# Patient Record
Sex: Female | Born: 1995 | Race: Black or African American | Hispanic: No | Marital: Single | Smoking: Never smoker
Health system: Southern US, Community
[De-identification: ages and names within clinical notes are randomized; demographics above are authoritative.]

## PROBLEM LIST (undated history)

## (undated) DIAGNOSIS — A549 Gonococcal infection, unspecified: Secondary | ICD-10-CM

## (undated) DIAGNOSIS — O24419 Gestational diabetes mellitus in pregnancy, unspecified control: Secondary | ICD-10-CM

## (undated) DIAGNOSIS — Z789 Other specified health status: Secondary | ICD-10-CM

## (undated) DIAGNOSIS — D649 Anemia, unspecified: Secondary | ICD-10-CM

## (undated) DIAGNOSIS — A749 Chlamydial infection, unspecified: Secondary | ICD-10-CM

## (undated) DIAGNOSIS — B999 Unspecified infectious disease: Secondary | ICD-10-CM

## (undated) HISTORY — PX: NO PAST SURGERIES: SHX2092

---

## 2016-02-27 ENCOUNTER — Inpatient Hospital Stay (HOSPITAL_COMMUNITY)
Admission: AD | Admit: 2016-02-27 | Discharge: 2016-02-28 | Disposition: A | Discharge status: Home / Self Care | Payer: Medicaid Other | Source: Ambulatory Visit | Attending: Family Medicine | Admitting: Family Medicine

## 2016-02-27 DIAGNOSIS — Z7251 High risk heterosexual behavior: Secondary | ICD-10-CM

## 2016-02-27 DIAGNOSIS — Z202 Contact with and (suspected) exposure to infections with a predominantly sexual mode of transmission: Secondary | ICD-10-CM | POA: Insufficient documentation

## 2016-02-27 DIAGNOSIS — A549 Gonococcal infection, unspecified: Secondary | ICD-10-CM | POA: Insufficient documentation

## 2016-02-27 HISTORY — DX: Other specified health status: Z78.9

## 2016-02-27 NOTE — MAU Note (Signed)
Boyfriend told me today he has gonorrhea. Having some white d/c. No pain

## 2016-02-28 ENCOUNTER — Encounter (HOSPITAL_COMMUNITY): Payer: Self-pay | Admitting: *Deleted

## 2016-02-28 DIAGNOSIS — A549 Gonococcal infection, unspecified: Secondary | ICD-10-CM | POA: Diagnosis not present

## 2016-02-28 DIAGNOSIS — Z202 Contact with and (suspected) exposure to infections with a predominantly sexual mode of transmission: Secondary | ICD-10-CM

## 2016-02-28 LAB — WET PREP, GENITAL
Sperm: NONE SEEN
Trich, Wet Prep: NONE SEEN
YEAST WET PREP: NONE SEEN

## 2016-02-28 LAB — POCT PREGNANCY, URINE: PREG TEST UR: NEGATIVE

## 2016-02-28 LAB — RPR: RPR: NONREACTIVE

## 2016-02-28 LAB — HIV ANTIBODY (ROUTINE TESTING W REFLEX): HIV Screen 4th Generation wRfx: NONREACTIVE

## 2016-02-28 MED ORDER — LEVONORGESTREL 0.75 MG PO TABS: 0.7500 mg | ORAL_TABLET | Freq: Two times a day (BID) | ORAL | 0 refills | Status: DC

## 2016-02-28 MED ORDER — AZITHROMYCIN 250 MG PO TABS
1000.0000 mg | ORAL_TABLET | Freq: Once | ORAL | Status: AC
  Administered 2016-02-28: 1000 mg via ORAL
  Filled 2016-02-28: qty 4

## 2016-02-28 MED ORDER — CEFTRIAXONE SODIUM 250 MG IJ SOLR
250.0000 mg | Freq: Once | INTRAMUSCULAR | Status: AC
  Administered 2016-02-28: 250 mg via INTRAMUSCULAR
  Filled 2016-02-28: qty 250

## 2016-02-28 NOTE — Discharge Instructions (Signed)
Gonorrhea Gonorrhea is an infection that can cause serious problems. If left untreated, the infection may:   Damage the female or female organs.   Cause women to be unable to have children (sterility).   Harm a fetus if the infected woman is pregnant.  It is important to get treatment for gonorrhea as soon as possible. It is also necessary that all your sexual partners be tested for the infection.  CAUSES  Gonorrhea is caused by bacteria called Neisseria gonorrhoeae. The infection is spread from person to person, usually by sexual contact (such as by anal, vaginal, or oral means). A newborn can contract the infection from his or her mother during birth.  RISK FACTORS  Being a woman younger than 20 years of age who is sexually active.  Being a woman 56 years of age or older who has:  A new sex partner.  More than one sex partner.  A sex partner who has a sexually transmitted disease (STD).  Using condoms inconsistently.  Currently having, or having previously had, an STD.  Exchanging sex or money or drugs. SYMPTOMS  Some people with gonorrhea do not have symptoms. Symptoms may be different in females and males.  Females The most common symptoms are:   Pain in the lower abdomen.   Fever with or without chills.  Other symptoms include:   Abnormal vaginal discharge.   Painful intercourse.   Burning or itching of the vagina or lips of the vagina.   Abnormal vaginal bleeding.   Pain when urinating.   Long-lasting (chronic) pain in the lower abdomen, especially during menstruation or intercourse.   Inability to become pregnant.   Going into premature labor.   Irritation, pain, bleeding, or discharge from the rectum. This may occur if the infection was spread by anal sex.   Sore throat or swollen lymph nodes in the neck. This may occur if the infection was spread by oral sex.  Males The most common symptoms are:   Discharge from the penis.   Pain  or burning during urination.   Pain or swelling in the testicles. Other symptoms may include:   Irritation, pain, bleeding, or discharge from the rectum. This may occur if the infection was spread by anal sex.   Sore throat, fever, or swollen lymph nodes in the neck. This may occur if the infection was spread by oral sex.  DIAGNOSIS  A diagnosis is made after a physical exam is done and a sample of discharge is examined under a microscope for the presence of the bacteria. The discharge may be taken from the urethra, cervix, throat, or rectum.  TREATMENT  Gonorrhea is treated with antibiotic medicines. It is important for treatment to begin as soon as possible. Early treatment may prevent some problems from developing. Do not have sex. Avoid all types of sexual activity for 7 days after treatment is complete and until any sex partners have been treated. HOME CARE INSTRUCTIONS   Take medicines only as directed by your health care provider.   Take your antibiotic medicine as directed by your health care provider. Finish the antibiotic even if you start to feel better. Incomplete treatment will put you at risk for continued infection.   Do not have sex until treatment is complete or as directed by your health care provider.   Keep all follow-up visits as directed by your health care provider.   Not all test results are available during your visit. If your test results are not back  during the visit, make an appointment with your health care provider to find out the results. Do not assume everything is normal if you have not heard from your health care provider or the medical facility. It is your responsibility to get your test results.  If you test positive for gonorrhea, inform your recent sexual partners. They need to be checked for gonorrhea even if they do not have symptoms. They may need treatment, even if they test negative for gonorrhea.  SEEK MEDICAL CARE IF:   You develop any  bad reaction to the medicine you were prescribed. This may include:   A rash.   Nausea.   Vomiting.   Diarrhea.   Your symptoms do not improve after a few days of taking antibiotics.   Your symptoms get worse.   You develop increased pain, such as in the testicles (for males) or in the abdomen (for females).  You have a fever. MAKE SURE YOU:   Understand these instructions.  Will watch your condition.  Will get help right away if you are not doing well or get worse. This information is not intended to replace advice given to you by your health care provider. Make sure you discuss any questions you have with your health care provider. Document Released: 12/26/1999 Document Revised: 01/18/2014 Document Reviewed: 07/05/2012 Elsevier Interactive Patient Education  2017 Elsevier Inc.  Emergency Contraception Emergency contraceptives prevent pregnancy after unprotected sexual intercourse. They can also be used:  When a condom breaks.  After a sexual assault.  If you forgot to take your birth control pills.  When inadequate protection occurs with sexual intercourse. Usually, emergency contraception is a pill or combination of pills taken right after sex or up to 5 days after unprotected sex. It is most effective the sooner you take the pills after having sexual intercourse. Most types of emergency contraceptive pills are available without a prescription. One type requires a prescription from your health care provider. Also, young women under age 44 need a prescription for most types of emergency contraception. Check with your pharmacist. Do not use emergency contraception as your only form of birth control. These pills do not protect against sexually transmitted infections (STIs). Emergency contraception will not work if you are already pregnant and will not harm the baby if you are pregnant. Emergency contraception does not cause an abortion. The pills work by preventing the  ovaries from releasing an egg (ovulation) or the fertilization of an egg. Taking St. Johns wort, certain antibiotic medicines, and certain anticonvulsant medicines may make these pills less effective. Discuss with your health care provider the possible side effects of emergency contraceptives. These may include:  Abdominal pain and cramps.  Breast tenderness.  Headache.  Dizziness.  Fatigue.  Irregular bleeding or spotting. Types of emergency contraceptives  Some types of emergency contraceptive pills contain estrogen and progesterone in higher doses.  Some types just contain progesterone. They are available as a single pill or two pills taken 12-24 hours apart.  One type of pill is not a hormone. It prevents the hormone progesterone from having its normal effect on ovulation and the lining of the uterus.  An intrauterine device (IUD) may be used.This T-shaped device is also used as a form of birth control. It is inserted into the uterus to prevent pregnancy. The copper IUD can also be used as emergency contraception if inserted within 5 days of having unprotected intercourse. Follow these instructions at home:  Eat something before taking the emergency contraceptive  pills.  Lie down for a couple of hours if you become tired or dizzy.  Continue using birth control until you start your menstrual period. Contact a health care provider if:  You throw up (vomit) within 2 hours after taking the pill. You will have to take another pill.  You need treatment for nausea, vomiting, headache, or abdominal cramps.  You have not had a menstrual period 21 days after taking the pill.  You are having irregular bleeding or spotting. Get help right away if:  You have chest pain.  You have leg pain.  You have numbness or weakness of your arms or legs.  You have slurred speech.  You have visual problems. This information is not intended to replace advice given to you by your health  care provider. Make sure you discuss any questions you have with your health care provider. Document Released: 03/08/2001 Document Revised: 06/05/2015 Document Reviewed: 06/11/2012 Elsevier Interactive Patient Education  2017 [employer]

## 2016-02-28 NOTE — MAU Provider Note (Signed)
Chief Complaint: Exposure to STD   SUBJECTIVE HPI: Christy [name] Currentlexis Christy [name] is a 21 y.o. G0P0000 at Unknown who presents to [facility] reporting exposure to gonorrhea.  Patient said her BF called her to inform her that he was positive for gonorrhea when he went to the health department today. She had unprotected sex with him two nights ago, after the condom broke. She is noticing some abnormal discharge, starting yesterday and today. Noticed spotting as well. She is not on birth control, does not want to be pregnant, desires plan B, and wants contraception. Never had STI before. Has had 6 sexual partners.    Past Medical History:  Diagnosis Date  . Medical history non-contributory    OB History  Gravida Para Term Preterm AB Living  0 0 0 0 0 0  SAB TAB Ectopic Multiple Live Births  0 0 0 0 0       Past Surgical History:  Procedure Laterality Date  . NO PAST SURGERIES     Social History   Social History  . Marital status: Single    Spouse name: N/A  . Number of children: N/A  . Years of education: N/A   Occupational History  . Not on file.   Social History Main Topics  . Smoking status: Never Smoker  . Smokeless tobacco: Never Used  . Alcohol use No  . Drug use: Yes    Types: Marijuana  . Sexual activity: Yes    Birth control/ protection: None   Other Topics Concern  . Not on file   Social History Narrative  . No narrative on file   No current facility-administered medications on file prior to encounter.    No current outpatient prescriptions on file prior to encounter.   Allergies not on file  I have reviewed the past Medical Hx, Surgical Hx, Social Hx, Allergies and Medications.   REVIEW OF SYSTEMS  A comprehensive ROS was negative except per HPI.    OBJECTIVE Patient Vitals for the past 24 hrs:  BP Temp Pulse Resp Height Weight  02/27/16 2348 118/74 98.3 F (36.8 C) 93 18 5\' 3"  (1.6 m) 147 lb 11.2 oz (67 kg)    PHYSICAL  EXAM Constitutional: Well-developed, well-nourished female in no acute distress.  Cardiovascular: normal rate, rhythm, no murmurs Respiratory: normal rate and effort. CTAB GI: Abd soft, non-tender, non-distended. Pos BS x 4 MS: Extremities nontender, no edema, normal ROM Neurologic: Alert and oriented x 4.  GU: Neg CVAT. SPECULUM EXAM: NEFG, frothy yellowish/white discharge, no blood noted, cervix clean but friable. BIMANUAL: cervix closed; uterus normal size, no adnexal tenderness or masses. No CMT.  LAB RESULTS Results for orders placed or performed during the hospital encounter of 02/27/16 (from the past 24 hour(s))  Pregnancy, urine POC     Status: None   Collection Time: 02/28/16 12:08 AM  Result Value Ref Range   Preg Test, Ur NEGATIVE NEGATIVE  Wet prep, genital     Status: Abnormal   Collection Time: 02/28/16  1:33 AM  Result Value Ref Range   Yeast Wet Prep HPF POC NONE SEEN NONE SEEN   Trich, Wet Prep NONE SEEN NONE SEEN   Clue Cells Wet Prep HPF POC PRESENT (A) NONE SEEN   WBC, Wet Prep HPF POC MANY (A) NONE SEEN   Sperm NONE SEEN     IMAGING No results found.  MAU COURSE STD panel Wet prep- +BV SSE Bimanual - no CMT Treatment given for GC/CT (Rocephin and  azithro)  MDM Plan of care reviewed with patient, including labs and tests ordered and medical treatment. Patient had exposure to gonorrhea, given treatment here for exposure. STD panel performed, as requested by patient. She also desires to not get pregnant and would like the plan B prescribed, educated patient must be taken before 5 days after unprotected intercourse (2 days ago), but best to be taken within 72 hours. She desires birth control, but due to recent unprotected intercourse, despite negative pregnancy test, informed patient to follow up with PCP or OB/GYN in 2 weeks for repeat pregnancy test and for birth control.    ASSESSMENT 1. Exposure to gonorrhea   2. Unprotected sex     PLAN Discharge  home in stable condition. S/P Azithro and Rocephin Plan B Rx given per patient request Follow up with PCP or OB/GYN for contraception  Follow-up Information    OB/GYN provider of choice Follow up in 2 week(s).   Why:  Follow up for contraception and STD recheck         Allergies as of 02/28/2016   Not on File     Medication List    TAKE these medications   levonorgestrel 0.75 MG tablet Commonly known as:  PLAN B Take 1 tablet (0.75 mg total) by mouth every 12 (twelve) hours.        Jen Mow, DO OB Fellow 02/28/2016 3:34 AM

## 2016-03-01 LAB — GC/CHLAMYDIA PROBE AMP (~~LOC~~) NOT AT ARMC
CHLAMYDIA, DNA PROBE: POSITIVE — AB
NEISSERIA GONORRHEA: POSITIVE — AB

## 2016-03-03 ENCOUNTER — Telehealth (HOSPITAL_COMMUNITY): Payer: Self-pay

## 2016-03-03 NOTE — Telephone Encounter (Signed)
2nd/Final attempt to contact patient regarding patient's STD lab results from her last visit. Patient's voice mail has not been set up. Communicable Disease Report faxed to the health clinic.

## 2016-03-04 NOTE — Progress Notes (Signed)
Received a call from Demi, NT from MAU, in regards to sending a certified letter for a pt that tested positive for STD.  Certified letter sent.  Demi, NT, charted that STD [facility] was [facility].

## 2017-09-12 ENCOUNTER — Other Ambulatory Visit: Payer: Self-pay

## 2017-09-12 ENCOUNTER — Encounter (HOSPITAL_COMMUNITY): Payer: Self-pay | Admitting: *Deleted

## 2017-09-12 ENCOUNTER — Inpatient Hospital Stay (HOSPITAL_COMMUNITY)
Admission: AD | Admit: 2017-09-12 | Discharge: 2017-09-12 | Disposition: A | Discharge status: Home / Self Care | Payer: Self-pay | Source: Ambulatory Visit | Attending: Obstetrics and Gynecology | Admitting: Obstetrics and Gynecology

## 2017-09-12 DIAGNOSIS — Z202 Contact with and (suspected) exposure to infections with a predominantly sexual mode of transmission: Secondary | ICD-10-CM

## 2017-09-12 DIAGNOSIS — K529 Noninfective gastroenteritis and colitis, unspecified: Secondary | ICD-10-CM | POA: Insufficient documentation

## 2017-09-12 LAB — WET PREP, GENITAL
Clue Cells Wet Prep HPF POC: NONE SEEN
SPERM: NONE SEEN
TRICH WET PREP: NONE SEEN
YEAST WET PREP: NONE SEEN

## 2017-09-12 LAB — POCT PREGNANCY, URINE: Preg Test, Ur: NEGATIVE

## 2017-09-12 NOTE — MAU Provider Note (Signed)
S: 22 y.o. G0 reports onset of abdominal cramping and loose stools 2 days ago. She reports her symptoms are improved today but she still had to miss work. She denies any fever/chills or n/v.  She has not tried any treatments. She drank alcohol on the night before symptoms started. She denies sick contacts.  She desires STD testing.   O: BP 110/72 (BP Location: Right Arm)   Pulse 72   Temp 98.7 F (37.1 C) (Oral)   Resp 16   Wt 68.3 kg   LMP 09/01/2017   SpO2 100%   BMI 26.66 kg/m   VS reviewed, nursing note reviewed,  Constitutional: well developed, well nourished, no distress HEENT: normocephalic CV: normal rate Pulm/chest wall: normal effort Abdomen: soft Neuro: alert and oriented x 3 Skin: warm, dry Psych: affect normal  Pelvic deferred, pt self swabbed for STD testing  A:  1. Gastroenteritis   2. Potential exposure to STD    P: Wet prep negative, GCC pending Safe sex, results in 24-48 hours Imodium for diarrhea, Brat diet List of ob/gyn offices given for pt to establish care  Sharen Counter, CNM 1:26 PM

## 2017-09-12 NOTE — MAU Note (Addendum)
Not having any problems, just wants to be checked.Has a boyfriend and she has trust issues .  Had pain in her abd this morning, has been off and on for 2 wks. Loose stools x2 this morning, was drinking last night  Did not go to work because of it.  Also wanting a preg test.

## 2017-09-13 LAB — GC/CHLAMYDIA PROBE AMP (~~LOC~~) NOT AT ARMC
CHLAMYDIA, DNA PROBE: POSITIVE — AB
Neisseria Gonorrhea: NEGATIVE

## 2017-09-15 ENCOUNTER — Inpatient Hospital Stay (HOSPITAL_COMMUNITY)
Admission: AD | Admit: 2017-09-15 | Discharge: 2017-09-15 | Disposition: A | Discharge status: Home / Self Care | Payer: Medicaid Other | Source: Ambulatory Visit | Attending: Obstetrics and Gynecology | Admitting: Obstetrics and Gynecology

## 2017-09-15 DIAGNOSIS — A749 Chlamydial infection, unspecified: Secondary | ICD-10-CM | POA: Insufficient documentation

## 2017-09-15 MED ORDER — AZITHROMYCIN 250 MG PO TABS
1000.0000 mg | ORAL_TABLET | Freq: Once | ORAL | Status: AC
  Administered 2017-09-15: 1000 mg via ORAL
  Filled 2017-09-15: qty 4

## 2017-09-15 NOTE — Discharge Instructions (Signed)
Chlamydia, Female Chlamydia is an STD (sexually transmitted disease). This is an infection that spreads through sexual contact. If it is not treated, it can cause serious problems. It must be treated with antibiotic medicine. Sometimes, you may not have symptoms (asymptomatic). When you have symptoms, they can include:  Burning when you pee (urinate).  Peeing often.  Fluid (discharge) coming from the vagina.  Redness, soreness, and swelling (inflammation) of the butt (rectum).  Bleeding or fluid coming from the butt.  Belly (abdominal) pain.  Pain during sex.  Bleeding between periods.  Itching, burning, or redness in the eyes.  Fluid coming from the eyes.  Follow these instructions at home: Medicines  Take over-the-counter and prescription medicines only as told by your doctor.  Take your antibiotic medicine as told by your doctor. Do not stop taking the antibiotic even if you start to feel better. Sexual activity  Tell sex partners about your infection. Sex partners are people you had oral, anal, or vaginal sex with within 60 days of when you started getting sick. They need treatment, too.  Do not have sex until: ? You and your sex partners have been treated. ? Your doctor says it is okay.  If you have a single dose treatment, wait 7 days before having sex. General instructions  It is up to you to get your test results. Ask your doctor when your results will be ready.  Get a lot of rest.  Eat healthy foods.  Drink enough fluid to keep your pee (urine) clear or pale yellow.  Keep all follow-up visits as told by your doctor. You may need tests after 3 months. Preventing chlamydia  The only way to prevent chlamydia is not to have sex. To lower your risk: ? Use latex condoms correctly. Do this every time you have sex. ? Avoid having many sex partners. ? Ask if your partner has been tested for STDs and if he or she had negative results. Contact a doctor if:  You  get new symptoms.  You do not get better with treatment.  You have a fever or chills.  You have pain during sex. Get help right away if:  Your pain gets worse and does not get better with medicine.  You get flu-like symptoms, such as: ? Night sweats. ? Sore throat. ? Muscle aches.  You feel sick to your stomach (nauseous).  You throw up (vomit).  You have trouble swallowing.  You have bleeding: ? Between periods. ? After sex.  You have irregular periods.  You have belly pain that does not get better with medicine.  You have lower back pain that does not get better with medicine.  You feel weak or dizzy.  You pass out (faint).  You are pregnant and you get symptoms of chlamydia. Summary  Chlamydia is an infection that spreads through sexual contact.  Sometimes, chlamydia can cause no symptoms (asymptomatic).  Do not have sex until your doctor says it is okay.  All sex partners will have to be treated for chlamydia. This information is not intended to replace advice given to you by your health care provider. Make sure you discuss any questions you have with your health care provider. Document Released: 10/07/2007 Document Revised: 12/18/2015 Document Reviewed: 12/18/2015 Elsevier Interactive Patient Education  2017 Elsevier Inc.  

## 2017-09-15 NOTE — MAU Provider Note (Signed)
  History     CSN: [id number]  Arrival date and time: 09/15/17 1454   None     Chief Complaint  Patient presents with  . Follow-up   HPI  Janara Ruffini is a 22 y.o. non pregnant patient who presents to MAU for Chlamydia treatment ( diagnosed 09/12/17). Denies other health-related concerns or complaints.   Past Medical History:  Diagnosis Date  . Medical history non-contributory     Past Surgical History:  Procedure Laterality Date  . NO PAST SURGERIES      No family history on file.  Social History   Tobacco Use  . Smoking status: Never Smoker  . Smokeless tobacco: Never Used  Substance Use Topics  . Alcohol use: No  . Drug use: Yes    Types: Marijuana    Allergies: Allergies not on file  Medications Prior to Admission  Medication Sig Dispense Refill Last Dose  . levonorgestrel (PLAN B) 0.75 MG tablet Take 1 tablet (0.75 mg total) by mouth every 12 (twelve) hours. 2 tablet 0     Review of Systems  Genitourinary: Positive for vaginal discharge.  All other systems reviewed and are negative.  Physical Exam   Blood pressure 118/75, pulse 80, temperature 98.5 F (36.9 C), temperature source Oral, resp. rate 16, last menstrual period 09/01/2017, SpO2 99 %.  Physical Exam  Nursing note and vitals reviewed. Constitutional: She is oriented to person, place, and time. She appears well-developed and well-nourished.  Respiratory: Effort normal.  Neurological: She is alert and oriented to person, place, and time. She has normal reflexes.  Skin: Skin is warm and dry.  Psychiatric: She has a normal mood and affect. Her behavior is normal. Judgment and thought content normal.    MAU Course  Procedures  MDM --Chlamydia positive 09/12/17  Patient Vitals for the past 24 hrs:  BP Temp Temp src Pulse Resp SpO2  09/15/17 1524 118/75 98.5 F (36.9 C) Oral 80 16 99 %     Assessment and Plan  --Chlamydia positive 09/12/17 --Treated today --Advised pt she  needs test of cure in 4-6 weeks --Declined rx for partner treatment --Referred to [facility] for free TOC and partner treatment --Emphasized that she and partner can reinfect each other if not simultaneously treated, condoms not 100% preventative  --Discharge home in stable condition  Calvert Cantor, CNM 09/15/2017, 3:35 PM

## 2017-12-25 ENCOUNTER — Encounter (HOSPITAL_COMMUNITY): Payer: Self-pay | Admitting: Emergency Medicine

## 2017-12-25 ENCOUNTER — Emergency Department (HOSPITAL_COMMUNITY)
Admission: EM | Admit: 2017-12-25 | Discharge: 2017-12-25 | Disposition: A | Discharge status: Home / Self Care | Payer: Self-pay | Attending: Emergency Medicine | Admitting: Emergency Medicine

## 2017-12-25 ENCOUNTER — Other Ambulatory Visit: Payer: Self-pay

## 2017-12-25 DIAGNOSIS — R112 Nausea with vomiting, unspecified: Secondary | ICD-10-CM | POA: Insufficient documentation

## 2017-12-25 DIAGNOSIS — R197 Diarrhea, unspecified: Secondary | ICD-10-CM | POA: Insufficient documentation

## 2017-12-25 LAB — COMPREHENSIVE METABOLIC PANEL
ALBUMIN: 4.6 g/dL (ref 3.5–5.0)
ALT: 13 U/L (ref 0–44)
AST: 16 U/L (ref 15–41)
Alkaline Phosphatase: 40 U/L (ref 38–126)
Anion gap: 8 (ref 5–15)
BUN: 8 mg/dL (ref 6–20)
CHLORIDE: 107 mmol/L (ref 98–111)
CO2: 25 mmol/L (ref 22–32)
Calcium: 9.1 mg/dL (ref 8.9–10.3)
Creatinine, Ser: 0.76 mg/dL (ref 0.44–1.00)
GFR calc Af Amer: >60 mL/min (ref >60)
GFR calc non Af Amer: >60 mL/min (ref >60)
GLUCOSE: 94 mg/dL (ref 70–99)
POTASSIUM: 3.9 mmol/L (ref 3.5–5.1)
SODIUM: 140 mmol/L (ref 135–145)
Total Bilirubin: 0.8 mg/dL (ref 0.3–1.2)
Total Protein: 7.7 g/dL (ref 6.5–8.1)

## 2017-12-25 LAB — CBC
HCT: 42.3 % (ref 36.0–46.0)
HEMOGLOBIN: 14 g/dL (ref 12.0–15.0)
MCH: 31.1 pg (ref 26.0–34.0)
MCHC: 33.1 g/dL (ref 30.0–36.0)
MCV: 94 fL (ref 80.0–100.0)
Platelets: 297 10*3/uL (ref 150–400)
RBC: 4.5 MIL/uL (ref 3.87–5.11)
RDW: 11.8 % (ref 11.5–15.5)
WBC: 8.5 10*3/uL (ref 4.0–10.5)
nRBC: 0 % (ref 0.0–0.2)

## 2017-12-25 LAB — URINALYSIS, ROUTINE W REFLEX MICROSCOPIC
BILIRUBIN URINE: NEGATIVE
Glucose, UA: NEGATIVE mg/dL
HGB URINE DIPSTICK: NEGATIVE
Ketones, ur: 5 mg/dL — AB
Leukocytes, UA: NEGATIVE
NITRITE: NEGATIVE
Protein, ur: NEGATIVE mg/dL
SPECIFIC GRAVITY, URINE: 1.026 (ref 1.005–1.030)
pH: 5 (ref 5.0–8.0)

## 2017-12-25 LAB — LIPASE, BLOOD: LIPASE: 27 U/L (ref 11–51)

## 2017-12-25 LAB — I-STAT BETA HCG BLOOD, ED (MC, WL, AP ONLY)

## 2017-12-25 LAB — GROUP A STREP BY PCR: Group A Strep by PCR: NOT DETECTED

## 2017-12-25 MED ORDER — SODIUM CHLORIDE 0.9 % IV SOLN
INTRAVENOUS | Status: AC
  Administered 2017-12-25: 14:00:00 via INTRAVENOUS

## 2017-12-25 MED ORDER — ONDANSETRON HCL 4 MG/2ML IJ SOLN
4.0000 mg | Freq: Once | INTRAMUSCULAR | Status: AC
  Administered 2017-12-25: 4 mg via INTRAVENOUS
  Filled 2017-12-25: qty 2

## 2017-12-25 MED ORDER — ONDANSETRON 4 MG PO TBDP: 4.0000 mg | ORAL_TABLET | Freq: Three times a day (TID) | ORAL | 0 refills | Status: DC | PRN

## 2017-12-25 NOTE — Discharge Instructions (Addendum)
Take the medication for nausea as needed. Follow up with your doctor in the next couple days or return here for worsening symptoms.

## 2017-12-25 NOTE — ED Provider Notes (Signed)
[facility]-EMERGENCY DEPT Provider Note   CSN: [id number] Arrival date & time: 12/25/17  1110     History   Chief Complaint Chief Complaint  Patient presents with  . Emesis    HPI Christy [name] is a 22 y.o. female who presents to the ED with c/o nausea and sore throat x 2 days. Today she reports n/v/d. Patient reports several people at her work have been sick. The history is provided by the patient. No language interpreter was used.  Emesis   This is a new problem. The current episode started 12 to 24 hours ago. The emesis has an appearance of stomach contents. The maximum temperature recorded prior to her arrival was 100 to 100.9 F. Associated symptoms include chills, cough, diarrhea, a fever, myalgias and URI. Pertinent negatives include no headaches. Abdominal pain: crapms with diarrhea. Risk factors include ill contacts.    Past Medical History:  Diagnosis Date  . Medical history non-contributory     There are no active problems to display for this patient.   Past Surgical History:  Procedure Laterality Date  . NO PAST SURGERIES       OB History    Gravida  0   Para  0   Term  0   Preterm  0   AB  0   Living  0     SAB  0   TAB  0   Ectopic  0   Multiple  0   Live Births  0            Home Medications    Prior to Admission medications   Medication Sig Start Date End Date Taking? Authorizing Provider  levonorgestrel (PLAN B) 0.75 MG tablet Take 1 tablet (0.75 mg total) by mouth every 12 (twelve) hours. 02/28/16   Mumaw, Hiram Comber, DO  ondansetron (ZOFRAN ODT) 4 MG disintegrating tablet Take 1 tablet (4 mg total) by mouth every 8 (eight) hours as needed for nausea or vomiting. 12/25/17   Janne Napoleon, NP    Family History No family history on file.  Social History Social History   Tobacco Use  . Smoking status: Never Smoker  . Smokeless tobacco: Never Used  Substance Use Topics  . Alcohol use:  No  . Drug use: Yes    Types: Marijuana     Allergies   Penicillins   Review of Systems Review of Systems  Constitutional: Positive for chills and fever.  HENT: Positive for congestion and sore throat. Negative for ear pain.   Eyes: Negative for pain, discharge and itching.  Respiratory: Positive for cough. Negative for shortness of breath.   Cardiovascular: Negative for chest pain.  Gastrointestinal: Positive for diarrhea and vomiting. Abdominal pain: crapms with diarrhea.  Genitourinary: Negative for dysuria.  Musculoskeletal: Positive for myalgias.  Skin: Negative for rash.  Neurological: Negative for syncope and headaches.  Hematological: Positive for adenopathy.  Psychiatric/Behavioral: Negative for confusion.     Physical Exam Updated Vital Signs BP 120/75   Pulse 62   Temp 98.5 F (36.9 C) (Oral)   Resp 17   LMP 12/18/2017 (Approximate)   SpO2 99%   Physical Exam Vitals signs and nursing note reviewed.  Constitutional:      General: She is not in acute distress.    Appearance: She is well-developed.  HENT:     Head: Normocephalic.     Right Ear: Tympanic membrane normal.     Left Ear: Tympanic membrane  normal.     Nose: Congestion present.     Mouth/Throat:     Pharynx: Posterior oropharyngeal erythema present.  Eyes:     Extraocular Movements: Extraocular movements intact.     Conjunctiva/sclera: Conjunctivae normal.  Neck:     Musculoskeletal: Neck supple.  Cardiovascular:     Rate and Rhythm: Normal rate and regular rhythm.  Pulmonary:     Effort: Pulmonary effort is normal.     Breath sounds: Normal breath sounds.  Abdominal:     Palpations: Abdomen is soft.     Tenderness: There is no abdominal tenderness.  Musculoskeletal: Normal range of motion.  Lymphadenopathy:     Cervical: Cervical adenopathy present.  Skin:    General: Skin is warm and dry.  Neurological:     Mental Status: She is alert and oriented to person, place, and time.    Psychiatric:        Mood and Affect: Mood normal.      ED Treatments / Results  Labs (all labs ordered are listed, but only abnormal results are displayed) Labs Reviewed  URINALYSIS, ROUTINE W REFLEX MICROSCOPIC - Abnormal; Notable for the following components:      Result Value   APPearance HAZY (*)    Ketones, ur 5 (*)    All other components within normal limits  GROUP A STREP BY PCR  LIPASE, BLOOD  COMPREHENSIVE METABOLIC PANEL  CBC  I-STAT BETA HCG BLOOD, ED ([facility], [facility], [facility] ONLY)   Radiology No results found.  Procedures Procedures (including critical care time)  Medications Ordered in ED Medications  0.9 %  sodium chloride infusion ( Intravenous New Bag/Given 12/25/17 1428)  ondansetron (ZOFRAN) injection 4 mg (4 mg Intravenous Given 12/25/17 1429)     Initial Impression / Assessment and Plan / ED Course  I have reviewed the triage vital signs and the nursing notes. 22 y.o. female here with n/v/d that started 2 days ago with fever and chills stable for d/c with symptoms improved significantly with treatment in the ED. Discussed with patient diet of clear liquids and progressing to the SUPERVALU INCBRAT diet. Patient to f/u with her PCP or return for worsening symptoms. Patient agrees with plan.   Final Clinical Impressions(s) / ED Diagnoses   Final diagnoses:  Nausea vomiting and diarrhea    ED Discharge Orders         Ordered    ondansetron (ZOFRAN ODT) 4 MG disintegrating tablet  Every 8 hours PRN     12/25/17 1554           Damian Leavelleese, DahlgrenHope M, NP 12/25/17 1604    Charlynne PanderYao, David Hsienta, MD 12/27/17 1133

## 2017-12-25 NOTE — ED Triage Notes (Signed)
Patient c/o sore throat x2 days and N/V/D today.

## 2018-01-15 ENCOUNTER — Emergency Department (HOSPITAL_COMMUNITY)
Admission: EM | Admit: 2018-01-15 | Discharge: 2018-01-15 | Disposition: A | Discharge status: Home / Self Care | Payer: Self-pay | Attending: Emergency Medicine | Admitting: Emergency Medicine

## 2018-01-15 ENCOUNTER — Encounter (HOSPITAL_COMMUNITY): Payer: Self-pay | Admitting: Obstetrics and Gynecology

## 2018-01-15 ENCOUNTER — Other Ambulatory Visit: Payer: Self-pay

## 2018-01-15 DIAGNOSIS — Z113 Encounter for screening for infections with a predominantly sexual mode of transmission: Secondary | ICD-10-CM | POA: Insufficient documentation

## 2018-01-15 DIAGNOSIS — B9689 Other specified bacterial agents as the cause of diseases classified elsewhere: Secondary | ICD-10-CM | POA: Insufficient documentation

## 2018-01-15 DIAGNOSIS — N760 Acute vaginitis: Secondary | ICD-10-CM | POA: Insufficient documentation

## 2018-01-15 DIAGNOSIS — Z202 Contact with and (suspected) exposure to infections with a predominantly sexual mode of transmission: Secondary | ICD-10-CM

## 2018-01-15 LAB — URINALYSIS, ROUTINE W REFLEX MICROSCOPIC
Bilirubin Urine: NEGATIVE
Glucose, UA: NEGATIVE mg/dL
Ketones, ur: NEGATIVE mg/dL
Leukocytes, UA: NEGATIVE
Nitrite: NEGATIVE
PH: 5 (ref 5.0–8.0)
Protein, ur: 30 mg/dL — AB
RBC / HPF: >50 RBC/hpf — ABNORMAL HIGH (ref 0–5)
Specific Gravity, Urine: 1.031 — ABNORMAL HIGH (ref 1.005–1.030)

## 2018-01-15 LAB — WET PREP, GENITAL
Trich, Wet Prep: NONE SEEN
YEAST WET PREP: NONE SEEN

## 2018-01-15 LAB — POC URINE PREG, ED: Preg Test, Ur: NEGATIVE

## 2018-01-15 MED ORDER — METRONIDAZOLE 500 MG PO TABS: 500.0000 mg | ORAL_TABLET | Freq: Two times a day (BID) | ORAL | 0 refills | Status: DC

## 2018-01-15 NOTE — ED Triage Notes (Signed)
Pt reports she has had sexual intercourse without a condom and is concerned for STDs.

## 2018-01-15 NOTE — ED Provider Notes (Signed)
[facility]-EMERGENCY DEPT Provider Note   CSN: [id number] Arrival date & time: 01/15/18  5170     History   Chief Complaint Chief Complaint  Patient presents with  . Exposure to STD    HPI Christy [name] is a 23 y.o. female who presents to the ED for STD screening. Patient reports having sexual intercourse without a condom and is concerned that she may have an STD. Patient reports dysuria and urgency. Patient with hx of Chlamydia.   The history is provided by the patient. No language interpreter was used.  Exposure to STD  This is a new problem. The current episode started more than 2 days ago. Nothing aggravates the symptoms. Nothing relieves the symptoms. She has tried nothing for the symptoms.    Past Medical History:  Diagnosis Date  . Medical history non-contributory     There are no active problems to display for this patient.   Past Surgical History:  Procedure Laterality Date  . NO PAST SURGERIES       OB History    Gravida  0   Para  0   Term  0   Preterm  0   AB  0   Living  0     SAB  0   TAB  0   Ectopic  0   Multiple  0   Live Births  0            Home Medications    Prior to Admission medications   Medication Sig Start Date End Date Taking? Authorizing Provider  metroNIDAZOLE (FLAGYL) 500 MG tablet Take 1 tablet (500 mg total) by mouth 2 (two) times daily. 01/15/18   Janne Napoleon, NP    Family History No family history on file.  Social History Social History   Tobacco Use  . Smoking status: Never Smoker  . Smokeless tobacco: Never Used  Substance Use Topics  . Alcohol use: No  . Drug use: Yes    Types: Marijuana     Allergies   Penicillins   Review of Systems Review of Systems  Genitourinary: Positive for dysuria, urgency and vaginal discharge.  All other systems reviewed and are negative.    Physical Exam Updated Vital Signs BP 113/70 (BP Location: Left Arm)   Pulse 60    Temp 99.1 F (37.3 C) (Oral)   Resp 16   Ht 5\' 2"  (1.575 m)   Wt 71.7 kg   LMP 12/18/2017 (Approximate)   SpO2 100%   BMI 28.90 kg/m   Physical Exam Vitals signs and nursing note reviewed.  Constitutional:      General: She is not in acute distress.    Appearance: She is well-developed.  HENT:     Head: Normocephalic.  Eyes:     Conjunctiva/sclera: Conjunctivae normal.  Neck:     Musculoskeletal: Neck supple.  Cardiovascular:     Rate and Rhythm: Normal rate.  Pulmonary:     Effort: Pulmonary effort is normal.  Abdominal:     Palpations: Abdomen is soft.     Tenderness: There is no abdominal tenderness. There is no right CVA tenderness or left CVA tenderness.  Genitourinary:    Comments: External genitalia without lesions, small blood vaginal vault. No CMT, no adnexal tenderness or mass palpated. Uterus without palpable enlargement.  Musculoskeletal: Normal range of motion.  Skin:    General: Skin is warm and dry.  Neurological:     Mental Status: She is  alert and oriented to person, place, and time.  Psychiatric:        Mood and Affect: Mood normal.      ED Treatments / Results  Labs (all labs ordered are listed, but only abnormal results are displayed) Labs Reviewed  WET PREP, GENITAL - Abnormal; Notable for the following components:      Result Value   Clue Cells Wet Prep HPF POC PRESENT (*)    WBC, Wet Prep HPF POC MANY (*)    All other components within normal limits  URINALYSIS, ROUTINE W REFLEX MICROSCOPIC - Abnormal; Notable for the following components:   APPearance HAZY (*)    Specific Gravity, Urine 1.031 (*)    Hgb urine dipstick LARGE (*)    Protein, ur 30 (*)    RBC / HPF >50 (*)    Bacteria, UA RARE (*)    All other components within normal limits  RPR  HIV ANTIBODY (ROUTINE TESTING W REFLEX)  POC URINE PREG, ED  GC/CHLAMYDIA PROBE AMP ([place]) NOT AT [facility]    Radiology No results found.  Procedures Procedures (including critical  care time)  Medications Ordered in ED Medications - No data to display   Initial Impression / Assessment and Plan / ED Course  I have reviewed the triage vital signs and the nursing notes. Pt presents with concerns for possible STD.  Pt understands that they have GC/Chlamydia cultures pending and that they will need to inform all sexual partners if results return positive. Pt not concerning for PID because hemodynamically stable and no cervical motion tenderness on pelvic exam. Pt has also been treated with Flagyl for Bacterial Vaginosis. Pt has been advised to not drink alcohol while on this medication.  Patient to be discharged with instructions to follow up with [facility]. Discussed importance of using protection when sexually active.   Final Clinical Impressions(s) / ED Diagnoses   Final diagnoses:  Possible exposure to STD  Bacterial vaginosis    ED Discharge Orders         Ordered    metroNIDAZOLE (FLAGYL) 500 MG tablet  2 times daily     01/15/18 1552           Kerrie [place], [place] 01/15/18 2209    Tilden Fossa, MD 01/16/18 [phone]

## 2018-01-15 NOTE — Discharge Instructions (Addendum)
Do not drink alcohol while taking the medication or it will make you very sick. Follow up with the health department. If your cultures are positive someone will call you .

## 2018-01-16 LAB — HIV ANTIBODY (ROUTINE TESTING W REFLEX): HIV Screen 4th Generation wRfx: NONREACTIVE

## 2018-01-16 LAB — GC/CHLAMYDIA PROBE AMP (~~LOC~~) NOT AT ARMC
CHLAMYDIA, DNA PROBE: NEGATIVE
Neisseria Gonorrhea: NEGATIVE

## 2018-01-16 LAB — RPR: RPR Ser Ql: NONREACTIVE

## 2018-04-25 ENCOUNTER — Other Ambulatory Visit: Payer: Self-pay

## 2018-04-25 ENCOUNTER — Emergency Department (HOSPITAL_COMMUNITY)
Admission: EM | Admit: 2018-04-25 | Discharge: 2018-04-25 | Disposition: A | Discharge status: Home / Self Care | Payer: Medicaid Other

## 2018-04-25 NOTE — ED Notes (Signed)
Pt seen previous to clocking in. Pt had stopped this EMT in the parking lot to ask questions. Seen driving away in car.

## 2018-04-25 NOTE — ED Notes (Signed)
Pt wants to leave

## 2018-04-26 ENCOUNTER — Inpatient Hospital Stay (HOSPITAL_COMMUNITY): Payer: Medicaid Other

## 2018-04-26 ENCOUNTER — Other Ambulatory Visit: Payer: Self-pay

## 2018-04-26 ENCOUNTER — Encounter (HOSPITAL_COMMUNITY): Payer: Self-pay | Admitting: *Deleted

## 2018-04-26 ENCOUNTER — Inpatient Hospital Stay (HOSPITAL_COMMUNITY)
Admission: AD | Admit: 2018-04-26 | Discharge: 2018-04-26 | Disposition: A | Discharge status: Home / Self Care | Payer: Medicaid Other | Attending: Obstetrics and Gynecology | Admitting: Obstetrics and Gynecology

## 2018-04-26 DIAGNOSIS — Z3A01 Less than 8 weeks gestation of pregnancy: Secondary | ICD-10-CM | POA: Diagnosis not present

## 2018-04-26 DIAGNOSIS — O26891 Other specified pregnancy related conditions, first trimester: Secondary | ICD-10-CM | POA: Diagnosis not present

## 2018-04-26 DIAGNOSIS — O209 Hemorrhage in early pregnancy, unspecified: Secondary | ICD-10-CM | POA: Insufficient documentation

## 2018-04-26 DIAGNOSIS — O3680X0 Pregnancy with inconclusive fetal viability, not applicable or unspecified: Secondary | ICD-10-CM | POA: Diagnosis not present

## 2018-04-26 DIAGNOSIS — R1030 Lower abdominal pain, unspecified: Secondary | ICD-10-CM | POA: Diagnosis not present

## 2018-04-26 DIAGNOSIS — Z6711 Type A blood, Rh negative: Secondary | ICD-10-CM

## 2018-04-26 LAB — CBC WITH DIFFERENTIAL/PLATELET
Abs Immature Granulocytes: 0.03 10*3/uL (ref 0.00–0.07)
Basophils Absolute: 0.1 10*3/uL (ref 0.0–0.1)
Basophils Relative: 0 %
Eosinophils Absolute: 0 10*3/uL (ref 0.0–0.5)
Eosinophils Relative: 0 %
HCT: 39.9 % (ref 36.0–46.0)
Hemoglobin: 13.7 g/dL (ref 12.0–15.0)
Immature Granulocytes: 0 %
Lymphocytes Relative: 16 %
Lymphs Abs: 1.8 10*3/uL (ref 0.7–4.0)
MCH: 31.1 pg (ref 26.0–34.0)
MCHC: 34.3 g/dL (ref 30.0–36.0)
MCV: 90.5 fL (ref 80.0–100.0)
Monocytes Absolute: 0.5 10*3/uL (ref 0.1–1.0)
Monocytes Relative: 5 %
Neutro Abs: 8.9 10*3/uL — ABNORMAL HIGH (ref 1.7–7.7)
Neutrophils Relative %: 79 %
Platelets: 284 10*3/uL (ref 150–400)
RBC: 4.41 MIL/uL (ref 3.87–5.11)
RDW: 11.8 % (ref 11.5–15.5)
WBC: 11.4 10*3/uL — ABNORMAL HIGH (ref 4.0–10.5)
nRBC: 0 % (ref 0.0–0.2)

## 2018-04-26 LAB — WET PREP, GENITAL
Sperm: NONE SEEN
Trich, Wet Prep: NONE SEEN
Yeast Wet Prep HPF POC: NONE SEEN

## 2018-04-26 LAB — ABO/RH: ABO/RH(D): A NEG

## 2018-04-26 LAB — POC URINE PREG, ED: Preg Test, Ur: POSITIVE — AB

## 2018-04-26 LAB — HCG, QUANTITATIVE, PREGNANCY: hCG, Beta Chain, Quant, S: 187 m[IU]/mL — ABNORMAL HIGH (ref <5)

## 2018-04-26 MED ORDER — RHO D IMMUNE GLOBULIN 1500 UNIT/2ML IJ SOSY
300.0000 ug | PREFILLED_SYRINGE | Freq: Once | INTRAMUSCULAR | Status: AC
  Administered 2018-04-26: 17:00:00 300 ug via INTRAMUSCULAR
  Filled 2018-04-26: qty 2

## 2018-04-26 NOTE — MAU Provider Note (Signed)
History     CSN: [id number]  Arrival date and time: 04/26/18 1303   First Provider Initiated Contact with Patient 04/26/18 1527      Chief Complaint  Patient presents with  . Vaginal Bleeding  . Possible Pregnancy   HPI   Ms.Christy [name] is a 23 y.o. female G1P0000 @ [redacted]w[redacted]d here with vaginal bleeding that started 2 days ago after intercourse. She noticed it right after intercourse and it never stopped. At times she see's small clots in the toilet. Some lower abdominal cramping. The pain is all across of lower abdomen.   OB History    Gravida  1   Para  0   Term  0   Preterm  0   AB  0   Living  0     SAB  0   TAB  0   Ectopic  0   Multiple  0   Live Births  0           Past Medical History:  Diagnosis Date  . Medical history non-contributory     Past Surgical History:  Procedure Laterality Date  . NO PAST SURGERIES      History reviewed. No pertinent family history.  Social History   Tobacco Use  . Smoking status: Never Smoker  . Smokeless tobacco: Never Used  Substance Use Topics  . Alcohol use: No  . Drug use: Not Currently    Types: Marijuana    Comment: stopped using when found out preg    Allergies:  Allergies  Allergen Reactions  . Penicillins Hives    DID THE REACTION INVOLVE: Swelling of the face/tongue/throat, SOB, or low BP? Yes Sudden or severe rash/hives, skin peeling, or the inside of the mouth or nose? Yes Did it require medical treatment? No When did it last happen?2019 If all above answers are "NO", may proceed with cephalosporin use.    Medications Prior to Admission  Medication Sig Dispense Refill Last Dose  . metroNIDAZOLE (FLAGYL) 500 MG tablet Take 1 tablet (500 mg total) by mouth 2 (two) times daily. 14 tablet 0    Results for orders placed or performed during the hospital encounter of 04/26/18 (from the past 48 hour(s))  POC Urine Pregnancy, ED (not at [facility])     Status: Abnormal   Collection  Time: 04/26/18  1:19 PM  Result Value Ref Range   Preg Test, Ur POSITIVE (A) NEGATIVE    Comment:        THE SENSITIVITY OF THIS METHODOLOGY IS >24 mIU/mL   CBC with Differential/Platelet     Status: Abnormal   Collection Time: 04/26/18  3:13 PM  Result Value Ref Range   WBC 11.4 (H) 4.0 - 10.5 K/uL   RBC 4.41 3.87 - 5.11 MIL/uL   Hemoglobin 13.7 12.0 - 15.0 g/dL   HCT 94.8 01.6 - 55.3 %   MCV 90.5 80.0 - 100.0 fL   MCH 31.1 26.0 - 34.0 pg   MCHC 34.3 30.0 - 36.0 g/dL   RDW 74.8 27.0 - 78.6 %   Platelets 284 150 - 400 K/uL   nRBC 0.0 0.0 - 0.2 %   Neutrophils Relative % 79 %   Neutro Abs 8.9 (H) 1.7 - 7.7 K/uL   Lymphocytes Relative 16 %   Lymphs Abs 1.8 0.7 - 4.0 K/uL   Monocytes Relative 5 %   Monocytes Absolute 0.5 0.1 - 1.0 K/uL   Eosinophils Relative 0 %   Eosinophils Absolute  0.0 0.0 - 0.5 K/uL   Basophils Relative 0 %   Basophils Absolute 0.1 0.0 - 0.1 K/uL   Immature Granulocytes 0 %   Abs Immature Granulocytes 0.03 0.00 - 0.07 K/uL    Comment: Performed at [facility], [address] [place], [place] [address]  ABO/Rh     Status: None   Collection Time: 04/26/18  3:13 PM  Result Value Ref Range   ABO/RH(D) A NEG   Rh IG workup (includes ABO/Rh)     Status: None (Preliminary result)   Collection Time: 04/26/18  3:13 PM  Result Value Ref Range   Gestational Age(Wks) 6    ABO/RH(D) A NEG    Antibody Screen      NEG Performed at [facility], [address] [place], [place] [address]    Unit Number [id number]    Blood Component Type RHIG    Unit division 00    Status of Unit ISSUED    Transfusion Status OK TO TRANSFUSE   Wet prep, genital     Status: Abnormal   Collection Time: 04/26/18  3:46 PM  Result Value Ref Range   Yeast Wet Prep HPF POC NONE SEEN NONE SEEN   Trich, Wet Prep NONE SEEN NONE SEEN   Clue Cells Wet Prep HPF POC PRESENT (A) NONE SEEN   WBC, Wet Prep HPF POC MODERATE (A) NONE SEEN    Comment: MODERATE BACTERIA SEEN    Sperm NONE SEEN     Comment: Performed at [facility], [address] [place], [place] [address]   [place] Ob Less Than 14 Weeks With Ob Transvaginal  Result Date: 04/26/2018 CLINICAL DATA:  Vaginal bleeding, abdominal cramps, positive urine pregnancy test EXAM: OBSTETRIC <14 WK US AND TRANSVAGINAL OB US TECHNIQUE: Both transabdominal and transvaginal ultrasound examinations were performed for complete evaluation of the gestation as well as the maternal uterus, adnexal regions, and pelvic cul-de-sac. Transvaginal technique was performed to assess early pregnancy. COMPARISON:  None. FINDINGS: Intrauterine gestational sac: None Subchorionic hemorrhage:  None visualized. Maternal uterus/adnexae: Endometrial complex measures 12 mm. Bilateral ovaries are within normal limits. No free fluid. IMPRESSION: No IUP is visualized. By definition, in the setting of a positive pregnancy test, this reflects a pregnancy of unknown location. Differential considerations include early normal IUP, abnormal IUP/missed abortion, or nonvisualized ectopic pregnancy. Serial beta HCG is suggested. Consider repeat pelvic ultrasound in 14 days, as clinically warranted. Electronically Signed   By: Charline BillsSriyesh  Krishnan M.D.   On: 04/26/2018 16:45   Review of Systems  Gastrointestinal: Positive for abdominal pain.  Genitourinary: Positive for vaginal bleeding.   Physical Exam   Blood pressure 129/79, pulse 81, temperature 98.2 F (36.8 C), resp. rate 18, height 5\' 2"  (1.575 m), weight 65.3 kg, last menstrual period 03/12/2018.  Physical Exam  Constitutional: She is oriented to person, place, and time. She appears well-developed and well-nourished. No distress.  HENT:  Head: Normocephalic.  Eyes: Pupils are equal, round, and reactive to light.  Respiratory: Effort normal.  GI: Soft. There is generalized abdominal tenderness.  Genitourinary:    Genitourinary Comments: Vagina - Small amount of dark red vaginal bleeding. Cervix  - + small amount of active bleeding from os.  Bimanual exam: Cervix closed, anterior  Uterus non tender, normal size Adnexa non tender, no masses bilaterally GC/Chlam, wet prep done Chaperone present for exam.    Musculoskeletal: Normal range of motion.  Neurological: She is alert and oriented to  person, place, and time.  Skin: Skin is warm. She is not diaphoretic.  Psychiatric: Her behavior is normal.    MAU Course  Procedures  None  MDM  ABO: A negative. Rhogam given.  US shows: No IUP  Quant 187 Assessment and Plan   A:  1. Pregnancy of unknown anatomic location   2. Vaginal bleeding in pregnancy, first trimester   3. Type A blood, Rh negative     P:  Discharge home in stable condition Return to MAU if symptoms worsen Return to MAU on Saturday AM for 48 hour STAT quant Ectopic precautions Pelvic rest  Jakki Doughty, Harolyn Rutherford, NP 04/26/2018 7:10 PM

## 2018-04-26 NOTE — MAU Note (Signed)
Pt presents to MAU with complaints of positive pregnancy test on April 11. States she had intercourse 3 days ago and has been having vaginal bleeding since the intercourse with lower abdominal cramping

## 2018-04-27 ENCOUNTER — Inpatient Hospital Stay (HOSPITAL_COMMUNITY)
Admission: AD | Admit: 2018-04-27 | Discharge: 2018-04-27 | Disposition: A | Discharge status: Home / Self Care | Payer: Medicaid Other | Attending: Obstetrics and Gynecology | Admitting: Obstetrics and Gynecology

## 2018-04-27 ENCOUNTER — Other Ambulatory Visit: Payer: Self-pay

## 2018-04-27 DIAGNOSIS — O3680X0 Pregnancy with inconclusive fetal viability, not applicable or unspecified: Secondary | ICD-10-CM | POA: Diagnosis not present

## 2018-04-27 DIAGNOSIS — O98219 Gonorrhea complicating pregnancy, unspecified trimester: Secondary | ICD-10-CM | POA: Diagnosis not present

## 2018-04-27 DIAGNOSIS — A549 Gonococcal infection, unspecified: Secondary | ICD-10-CM

## 2018-04-27 LAB — RH IG WORKUP (INCLUDES ABO/RH)
ABO/RH(D): A NEG
Antibody Screen: NEGATIVE
Gestational Age(Wks): 6
Unit division: 0

## 2018-04-27 LAB — GC/CHLAMYDIA PROBE AMP (~~LOC~~) NOT AT ARMC
Chlamydia: NEGATIVE
Neisseria Gonorrhea: POSITIVE — AB

## 2018-04-27 LAB — URINALYSIS, ROUTINE W REFLEX MICROSCOPIC
Bacteria, UA: NONE SEEN
Bilirubin Urine: NEGATIVE
Glucose, UA: NEGATIVE mg/dL
Ketones, ur: 20 mg/dL — AB
Leukocytes,Ua: NEGATIVE
Nitrite: NEGATIVE
Protein, ur: NEGATIVE mg/dL
RBC / HPF: >50 RBC/hpf — ABNORMAL HIGH (ref 0–5)
Specific Gravity, Urine: 1.024 (ref 1.005–1.030)
pH: 6 (ref 5.0–8.0)

## 2018-04-27 NOTE — Discharge Instructions (Signed)
Gonorrhea Gonorrhea is a sexually transmitted disease (STD) that can affect both men and women. If left untreated, this infection can:  Damage the female or female organs.  Cause women and men to be unable to have children (be sterile).  Harm a fetus if an infected woman is pregnant. It is important to get treatment for gonorrhea as soon as possible. It is also necessary for all of your sexual partners to be tested for the infection. What are the causes? This condition is caused by bacteria called Neisseria gonorrhoeae. The infection is spread from person to person through sexual contact, including oral, anal, and vaginal sex. A newborn can contract the infection from his or her mother during birth. What increases the risk? The following factors may make you more likely to develop this condition:  Being a woman who is younger than 22 years of age and who is sexually active.  Being a woman 25 years of age or older who has: ? A new sex partner. ? More than one sex partner. ? A sex partner who has an STD.  Being a man who has: ? A new sex partner. ? More than one sex partner. ? A sex partner who has an STD.  Using condoms inconsistently.  Currently having, or having previously had, an STD.  Exchanging sex for money or drugs. What are the signs or symptoms? Some people do not have any symptoms. If you do have symptoms, they may be different for females and males. For females  Pain in the lower abdomen.  Abnormal vaginal discharge. The discharge may be cloudy, thick, or yellow-green in color.  Bleeding between periods.  Painful sex.  Burning or itching in and around the vagina.  Pain or burning when urinating.  Irritation, pain, bleeding, or discharge from the rectum. This may occur if the infection was spread by anal sex.  Sore throat or swollen lymph nodes in the neck. This may occur if the infection was spread by oral sex. For males  Abnormal discharge from the penis.  This discharge may be cloudy, thick, or yellow-green in color.  Pain or burning during urination.  Pain or swelling in the testicles.  Irritation, pain, bleeding, or discharge from the rectum. This may occur if the infection was spread by anal sex.  Sore throat, fever, or swollen lymph nodes in the neck. This may occur if the infection was spread by oral sex. How is this diagnosed? This condition is diagnosed based on:  A physical exam.  A sample of discharge that is examined under a microscope to look for the bacteria. The discharge may be taken from the urethra, cervix, throat, or rectum.  Urine tests. Not all of test results will be available during your visit. How is this treated? This condition is treated with antibiotic medicines. It is important for treatment to begin as soon as possible. Early treatment may prevent some problems from developing. Do not have sex during treatment. Avoid all types of sexual activity for 7 days after treatment is complete and until any sex partners have been treated. Follow these instructions at home:  Take over-the-counter and prescription medicines only as told by your health care provider.  Take your antibiotic medicine as told by your health care provider. Do not stop taking the antibiotic even if you start to feel better.  Do not have sex until at least 7 days after you and your partner(s) have finished treatment and your health care provider says it is okay.    It is your responsibility to get your test results. Ask your health care provider, or the department performing the test, when your results will be ready.  If you test positive for gonorrhea, inform your recent sexual partners. This includes any oral, anal, or vaginal sex partners. They need to be checked for gonorrhea even if they do not have symptoms. They may need treatment, even if they test negative for gonorrhea.  Keep all follow-up visits as told by your health care provider.  This is important. How is this prevented?   Use latex condoms correctly every time you have sexual intercourse.  Ask if your sexual partner has been tested for STDs and had negative results.  Avoid having multiple sexual partners. Contact a health care provider if:  You develop a bad reaction to the medicine you were prescribed. This may include: ? A rash. ? Nausea. ? Vomiting. ? Diarrhea.  Your symptoms do not get better after a few days of taking antibiotics.  Your symptoms get worse.  You develop new symptoms.  Your pain gets worse.  You have a fever.  You develop pain, itching, or discharge around the eyes. Get help right away if:  You feel dizzy or faint.  You have trouble breathing or have shortness of breath.  You develop an irregular heartbeat.  You have severe abdominal pain with or without shoulder pain.  You develop any bumps or sores (lesions) on your skin.  You develop warmth, redness, pain, or swelling around your joints, such as the knee. Summary  Gonorrhea is an STDthat can affect both men and women.  This condition is caused by bacteria called Neisseria gonorrhoeae. The infection is spread from person to person, usually through sexual contact, including oral, anal, and vaginal sex.  Symptoms vary between males and females. Generally, they include abnormal discharge and burning during urination. Women may also experience painful sex, itching around the vagina, and bleeding between menstrual periods. Men may also experience swelling of the testicles.  This condition is treated with antibiotic medicines. Do not have sex until at least 7 days after completing antibiotic treatment.  If left untreated, gonorrhea can have serious side effects and complications. This information is not intended to replace advice given to you by your health care provider. Make sure you discuss any questions you have with your health care provider. Document Released:  12/26/1999 Document Revised: 09/16/2017 Document Reviewed: 11/28/2015 Elsevier Interactive Patient Education  2019 [employer]  

## 2018-04-27 NOTE — MAU Note (Signed)
Pt stated she had intercourse 3 days ago has been bleeding ever since. Went to the ED yesterday for the bleeding. Someone called her today and told her that she tested positive for gonorrhea. Pt is still having vag bleeding and some abd cramping on and off

## 2018-04-27 NOTE — MAU Provider Note (Signed)
First Provider Initiated Contact with Patient 04/27/18 1628     [name] is a 23 y.o. G29P0000 female requesting treatment for Gonorrhea today.  She is s/p diagosis of pregnancy of unknown location in MAU yesterday. She states her chief complaints of vaginal spotting and mild abdominal cramping have improved since that time but are still occurring. She reports she was told to receive Gonorrhea treatment at the health department "but my dad brought me here instead".  Patient reports Penicillin allergy in 2019 of hives, facial swelling.  O BP 113/77   Pulse 94   Temp 98.5 F (36.9 C)   Resp 18   Ht 5\' 2"  (1.575 m)   Wt 64.9 kg   LMP 03/12/2018   BMI 26.16 kg/m    Physical Exam  Nursing note and vitals reviewed. Constitutional: She is oriented to person, place, and time. She appears well-developed and well-nourished.  Respiratory: Effort normal. No respiratory distress.  GI: She exhibits no distension. There is no abdominal tenderness. There is no rebound and no guarding.  Neurological: She is alert and oriented to person, place, and time.  Skin: Skin is warm and dry.  Psychiatric: She has a normal mood and affect. Her behavior is normal. Judgment and thought content normal.   A Medical screening exam complete Pregnant female No visible IUP on imaging yesterday CDC recommendation for Gonorrhea treatment in setting of PCN allergy not on formulary.  Unable to treat today or order outpatient.  Pharmacy unable to identify reasonable substitute on formulary  P Discharge from MAU in stable condition Return to MAU for repeat Quant hCG Saturday as previously coordinated Report to Health Dept for Gonorrhea treatment Return to MAU for worsening acuity  Briant Sites 04/27/2018 5:34 PM

## 2018-04-28 ENCOUNTER — Telehealth: Payer: Self-pay | Admitting: *Deleted

## 2018-04-28 ENCOUNTER — Ambulatory Visit: Payer: Self-pay

## 2018-04-28 DIAGNOSIS — O98219 Gonorrhea complicating pregnancy, unspecified trimester: Secondary | ICD-10-CM

## 2018-04-28 MED ORDER — AZITHROMYCIN 500 MG PO TABS: ORAL_TABLET | ORAL | 0 refills | Status: DC

## 2018-04-28 NOTE — Telephone Encounter (Signed)
Patient called stating she needed to be treated for gonorrhea. She tested positive a few days ago at ED and went to MAU for treatment. They were not able to treat patient. Patient also stated she just found out she was pregnant. Patient has allergy to PCN (hives).   Consulted with Tommi Emery, CNM to dispense Azithromycin 2 gm PO x 1. Advised patient to take medication with food. If unable to keep medication down, to call clinic back on Monday. Pt to avoid sex with partner for 10-14 days and partner should contact health department or PCP for treatment.  Medication sent to pharmacy per patient request.  Patient will receive a call from [facility] scheduler on Monday to make an appointment for prenatal care.  Clovis Pu, RN

## 2018-04-29 ENCOUNTER — Other Ambulatory Visit: Payer: Self-pay

## 2018-04-29 ENCOUNTER — Inpatient Hospital Stay (HOSPITAL_COMMUNITY)
Admission: AD | Admit: 2018-04-29 | Discharge: 2018-04-29 | Disposition: A | Discharge status: Home / Self Care | Payer: Medicaid Other | Attending: Obstetrics and Gynecology | Admitting: Obstetrics and Gynecology

## 2018-04-29 DIAGNOSIS — Z880 Allergy status to penicillin: Secondary | ICD-10-CM

## 2018-04-29 DIAGNOSIS — O039 Complete or unspecified spontaneous abortion without complication: Secondary | ICD-10-CM | POA: Diagnosis present

## 2018-04-29 DIAGNOSIS — A549 Gonococcal infection, unspecified: Secondary | ICD-10-CM | POA: Diagnosis not present

## 2018-04-29 LAB — HCG, QUANTITATIVE, PREGNANCY: hCG, Beta Chain, Quant, S: 51 m[IU]/mL — ABNORMAL HIGH (ref <5)

## 2018-04-29 MED ORDER — AZITHROMYCIN 500 MG PO TABS: 2000.0000 mg | ORAL_TABLET | Freq: Once | ORAL | 0 refills | Status: DC

## 2018-04-29 NOTE — MAU Note (Signed)
Christy [name] is a 23 y.o. at [redacted]w[redacted]d here in MAU reporting: here for repeat hcg, states she is still having bleeding but it is less then what she was having previously. No pain. States she took her meds for gonorrhea last night and vomited an hour after- unsure if she needs a new Rx  Pain score: 0/10  Vitals:   04/29/18 1523  BP: 102/67  Pulse: 97  Resp: 16  Temp: 98.9 F (37.2 C)  SpO2: 97%      Lab orders placed from triage: hcg order released

## 2018-04-29 NOTE — MAU Provider Note (Signed)
[name]  is a 23 y.o. G1P0000  at [redacted]w[redacted]d who presents to MAU today for follow-up quant hCG. Reports continued vaginal bleeding that has decreased since her last visit. Denies abdominal pain.  Was treated for gonorrhea but vomited within an hour of taking her medication yesterday.   BP 102/67 (BP Location: Right Arm)   Pulse 97   Temp 98.9 F (37.2 C) (Oral)   Resp 16   Ht 5\' 2"  (1.575 m)   Wt 64.6 kg   LMP 03/12/2018   SpO2 97%   BMI 26.06 kg/m   GENERAL: Well-developed, well-nourished female in no acute distress.  HEENT: Normocephalic, atraumatic.   LUNGS: Effort normal HEART: Regular rate  SKIN: Warm, dry and without erythema PSYCH: Normal mood and affect   A: 1. Miscarriage   2. Gonorrhea   3. Hx of allergy to penicillin      P: Discharge home Msg to [facility] for SAB f/u in 2 weeks No intercourse x 2 weeks  Rx azithromycin for gonorrhea tx Pt is A negative -- received rhogam during last MAU visit  Judeth Horn, NP  04/29/2018 4:22 PM

## 2018-04-29 NOTE — Discharge Instructions (Signed)
Miscarriage  A miscarriage is the loss of an unborn baby (fetus) before the 20th week of pregnancy. Most miscarriages happen during the first 3 months of pregnancy. Sometimes, a miscarriage can happen before a woman knows that she is pregnant.  Having a miscarriage can be an emotional experience. If you have had a miscarriage, talk with your health care provider about any questions you may have about miscarrying, the grieving process, and your plans for future pregnancy.  What are the causes?  A miscarriage may be caused by:  · Problems with the genes or chromosomes of the fetus. These problems make it impossible for the baby to develop normally. They are often the result of random errors that occur early in the development of the baby, and are not passed from parent to child (not inherited).  · Infection of the cervix or uterus.  · Conditions that affect hormone balance in the body.  · Problems with the cervix, such as the cervix opening and thinning before pregnancy is at term (cervical insufficiency).  · Problems with the uterus. These may include:  ? A uterus with an abnormal shape.  ? Fibroids in the uterus.  ? Congenital abnormalities. These are problems that were present at birth.  · Certain medical conditions.  · Smoking, drinking alcohol, or using drugs.  · Injury (trauma).  In many cases, the cause of a miscarriage is not known.  What are the signs or symptoms?  Symptoms of this condition include:  · Vaginal bleeding or spotting, with or without cramps or pain.  · Pain or cramping in the abdomen or lower back.  · Passing fluid, tissue, or blood clots from the vagina.  How is this diagnosed?  This condition may be diagnosed based on:  · A physical exam.  · Ultrasound.  · Blood tests.  · Urine tests.  How is this treated?  Treatment for a miscarriage is sometimes not necessary if you naturally pass all the tissue that was in your uterus. If necessary, this condition may be treated with:  · Dilation and  curettage (D&C). This is a procedure in which the cervix is stretched open and the lining of the uterus (endometrium) is scraped. This is done only if tissue from the fetus or placenta remains in the body (incomplete miscarriage).  · Medicines, such as:  ? Antibiotic medicine, to treat infection.  ? Medicine to help the body pass any remaining tissue.  ? Medicine to reduce (contract) the size of the uterus. These medicines may be given if you have a lot of bleeding.  If you have Rh negative blood and your baby was Rh positive, you will need a shot of a medicine called Rh immunoglobulinto protect your future babies from Rh blood problems. "Rh-negative" and "Rh-positive" refer to whether or not the blood has a specific protein found on the surface of red blood cells (Rh factor).  Follow these instructions at home:  Medicines    · Take over-the-counter and prescription medicines only as told by your health care provider.  · If you were prescribed antibiotic medicine, take it as told by your health care provider. Do not stop taking the antibiotic even if you start to feel better.  · Do not take NSAIDs, such as aspirin and ibuprofen, unless they are approved by your health care provider. These medicines can cause bleeding.  Activity  · Rest as directed. Ask your health care provider what activities are safe for you.  · Have someone   help with home and family responsibilities during this time.  General instructions  · Keep track of the number of sanitary pads you use each day and how soaked (saturated) they are. Write down this information.  · Monitor the amount of tissue or blood clots that you pass from your vagina. Save any large amounts of tissue for your health care provider to examine.  · Do not use tampons, douche, or have sex until your health care provider approves.  · To help you and your partner with the process of grieving, talk with your health care provider or seek counseling.  · When you are ready, meet with  your health care provider to discuss any important steps you should take for your health. Also, discuss steps you should take to have a healthy pregnancy in the future.  · Keep all follow-up visits as told by your health care provider. This is important.  Where to find more information  · The American Congress of Obstetricians and Gynecologists: www.acog.org  · U.[place] Department of Health and Human Services Office of Women’s Health: www.womenshealth.gov  Contact a health care provider if:  · You have a fever or chills.  · You have a foul smelling vaginal discharge.  · You have more bleeding instead of less.  Get help right away if:  · You have severe cramps or pain in your back or abdomen.  · You pass blood clots or tissue from your vagina that is walnut-sized or larger.  · You soak more than 1 regular sanitary pad in an hour.  · You become light-headed or weak.  · You pass out.  · You have feelings of sadness that take over your thoughts, or you have thoughts of hurting yourself.  Summary  · Most miscarriages happen in the first 3 months of pregnancy. Sometimes miscarriage happens before a woman even knows that she is pregnant.  · Follow your health care provider's instruction for home care. Keep all follow-up appointments.  · To help you and your partner with the process of grieving, talk with your health care provider or seek counseling.  This information is not intended to replace advice given to you by your health care provider. Make sure you discuss any questions you have with your health care provider.  Document Released: 06/23/2000 Document Revised: 02/03/2016 Document Reviewed: 02/03/2016  Elsevier Interactive Patient Education © 2019 [employer]vier Inc.

## 2018-05-02 ENCOUNTER — Telehealth: Payer: Self-pay | Admitting: Family Medicine

## 2018-05-02 NOTE — Telephone Encounter (Signed)
Spoke with patient about getting treated. She stated she was going to the office on [facility].

## 2018-05-04 ENCOUNTER — Encounter: Payer: Self-pay | Admitting: General Practice

## 2018-05-05 ENCOUNTER — Other Ambulatory Visit: Payer: Medicaid Other | Admitting: *Deleted

## 2018-05-05 ENCOUNTER — Other Ambulatory Visit: Payer: Self-pay

## 2018-05-05 DIAGNOSIS — Z8759 Personal history of other complications of pregnancy, childbirth and the puerperium: Secondary | ICD-10-CM

## 2018-05-05 NOTE — Progress Notes (Signed)
Patient in clinic for beta HCG.  Clovis Pu, RN

## 2018-05-06 LAB — BETA HCG QUANT (REF LAB): hCG Quant: 7 m[IU]/mL

## 2018-05-09 ENCOUNTER — Other Ambulatory Visit: Payer: Medicaid Other

## 2018-05-25 ENCOUNTER — Telehealth: Payer: Medicaid Other | Admitting: Obstetrics and Gynecology

## 2018-05-25 ENCOUNTER — Ambulatory Visit: Payer: Medicaid Other | Admitting: Obstetrics and Gynecology

## 2018-06-15 ENCOUNTER — Encounter (HOSPITAL_COMMUNITY): Payer: Self-pay

## 2018-06-15 ENCOUNTER — Inpatient Hospital Stay (HOSPITAL_COMMUNITY): Payer: Medicaid Other

## 2018-06-15 ENCOUNTER — Inpatient Hospital Stay (HOSPITAL_COMMUNITY)
Admission: AD | Admit: 2018-06-15 | Discharge: 2018-06-15 | Disposition: A | Discharge status: Other Acute Inpatient Hospital | Payer: Medicaid Other | Attending: Obstetrics and Gynecology | Admitting: Obstetrics and Gynecology

## 2018-06-15 ENCOUNTER — Other Ambulatory Visit: Payer: Self-pay

## 2018-06-15 DIAGNOSIS — O210 Mild hyperemesis gravidarum: Secondary | ICD-10-CM

## 2018-06-15 DIAGNOSIS — R109 Unspecified abdominal pain: Secondary | ICD-10-CM

## 2018-06-15 DIAGNOSIS — Z3490 Encounter for supervision of normal pregnancy, unspecified, unspecified trimester: Secondary | ICD-10-CM

## 2018-06-15 DIAGNOSIS — O99283 Endocrine, nutritional and metabolic diseases complicating pregnancy, third trimester: Secondary | ICD-10-CM | POA: Diagnosis not present

## 2018-06-15 DIAGNOSIS — R112 Nausea with vomiting, unspecified: Secondary | ICD-10-CM

## 2018-06-15 DIAGNOSIS — E860 Dehydration: Secondary | ICD-10-CM | POA: Diagnosis not present

## 2018-06-15 DIAGNOSIS — R1084 Generalized abdominal pain: Secondary | ICD-10-CM | POA: Insufficient documentation

## 2018-06-15 DIAGNOSIS — O98211 Gonorrhea complicating pregnancy, first trimester: Secondary | ICD-10-CM

## 2018-06-15 DIAGNOSIS — O211 Hyperemesis gravidarum with metabolic disturbance: Secondary | ICD-10-CM | POA: Diagnosis not present

## 2018-06-15 DIAGNOSIS — O26899 Other specified pregnancy related conditions, unspecified trimester: Secondary | ICD-10-CM

## 2018-06-15 DIAGNOSIS — Z3A01 Less than 8 weeks gestation of pregnancy: Secondary | ICD-10-CM | POA: Diagnosis not present

## 2018-06-15 DIAGNOSIS — O219 Vomiting of pregnancy, unspecified: Secondary | ICD-10-CM | POA: Diagnosis present

## 2018-06-15 DIAGNOSIS — O26891 Other specified pregnancy related conditions, first trimester: Secondary | ICD-10-CM

## 2018-06-15 HISTORY — DX: Anemia, unspecified: D64.9

## 2018-06-15 HISTORY — DX: Chlamydial infection, unspecified: A74.9

## 2018-06-15 HISTORY — DX: Unspecified infectious disease: B99.9

## 2018-06-15 HISTORY — DX: Gonococcal infection, unspecified: A54.9

## 2018-06-15 LAB — URINALYSIS, ROUTINE W REFLEX MICROSCOPIC
Bilirubin Urine: NEGATIVE
Glucose, UA: NEGATIVE mg/dL
Ketones, ur: 80 mg/dL — AB
Leukocytes,Ua: NEGATIVE
Nitrite: NEGATIVE
Protein, ur: 100 mg/dL — AB
Specific Gravity, Urine: 1.03 (ref 1.005–1.030)
pH: 6 (ref 5.0–8.0)

## 2018-06-15 LAB — COMPREHENSIVE METABOLIC PANEL
ALT: 10 U/L (ref 0–44)
AST: 13 U/L — ABNORMAL LOW (ref 15–41)
Albumin: 4.2 g/dL (ref 3.5–5.0)
Alkaline Phosphatase: 33 U/L — ABNORMAL LOW (ref 38–126)
Anion gap: 13 (ref 5–15)
BUN: 6 mg/dL (ref 6–20)
CO2: 19 mmol/L — ABNORMAL LOW (ref 22–32)
Calcium: 9.3 mg/dL (ref 8.9–10.3)
Chloride: 105 mmol/L (ref 98–111)
Creatinine, Ser: 0.68 mg/dL (ref 0.44–1.00)
GFR calc Af Amer: >60 mL/min (ref >60)
GFR calc non Af Amer: >60 mL/min (ref >60)
Glucose, Bld: 86 mg/dL (ref 70–99)
Potassium: 3.2 mmol/L — ABNORMAL LOW (ref 3.5–5.1)
Sodium: 137 mmol/L (ref 135–145)
Total Bilirubin: 0.9 mg/dL (ref 0.3–1.2)
Total Protein: 7.1 g/dL (ref 6.5–8.1)

## 2018-06-15 LAB — CBC
HCT: 38.1 % (ref 36.0–46.0)
Hemoglobin: 13.5 g/dL (ref 12.0–15.0)
MCH: 32.1 pg (ref 26.0–34.0)
MCHC: 35.4 g/dL (ref 30.0–36.0)
MCV: 90.7 fL (ref 80.0–100.0)
Platelets: 288 10*3/uL (ref 150–400)
RBC: 4.2 MIL/uL (ref 3.87–5.11)
RDW: 11.3 % — ABNORMAL LOW (ref 11.5–15.5)
WBC: 13.5 10*3/uL — ABNORMAL HIGH (ref 4.0–10.5)
nRBC: 0 % (ref 0.0–0.2)

## 2018-06-15 LAB — WET PREP, GENITAL
Sperm: NONE SEEN
Trich, Wet Prep: NONE SEEN
Yeast Wet Prep HPF POC: NONE SEEN

## 2018-06-15 LAB — I-STAT BETA HCG BLOOD, ED (MC, WL, AP ONLY): I-stat hCG, quantitative: >2000 m[IU]/mL — ABNORMAL HIGH (ref <5)

## 2018-06-15 LAB — HCG, QUANTITATIVE, PREGNANCY: hCG, Beta Chain, Quant, S: 57609 m[IU]/mL — ABNORMAL HIGH (ref <5)

## 2018-06-15 LAB — LIPASE, BLOOD: Lipase: 28 U/L (ref 11–51)

## 2018-06-15 MED ORDER — PROMETHAZINE HCL 25 MG PO TABS: 12.5000 mg | ORAL_TABLET | Freq: Four times a day (QID) | ORAL | 1 refills | Status: DC | PRN

## 2018-06-15 MED ORDER — SODIUM CHLORIDE 0.9% FLUSH: 3.0000 mL | Freq: Once | INTRAVENOUS | Status: DC

## 2018-06-15 MED ORDER — LACTATED RINGERS IV BOLUS
1000.0000 mL | Freq: Once | INTRAVENOUS | Status: AC
  Administered 2018-06-15: 1000 mL via INTRAVENOUS

## 2018-06-15 MED ORDER — PROMETHAZINE HCL 25 MG/ML IJ SOLN
25.0000 mg | Freq: Four times a day (QID) | INTRAMUSCULAR | Status: DC | PRN
  Administered 2018-06-15: 18:00:00 25 mg via INTRAVENOUS
  Filled 2018-06-15: qty 1

## 2018-06-15 MED ORDER — SCOPOLAMINE 1 MG/3DAYS TD PT72
1.0000 | MEDICATED_PATCH | TRANSDERMAL | Status: DC
  Administered 2018-06-15: 18:00:00 1.5 mg via TRANSDERMAL
  Filled 2018-06-15: qty 1

## 2018-06-15 MED ORDER — SCOPOLAMINE 1 MG/3DAYS TD PT72: 1.0000 | MEDICATED_PATCH | TRANSDERMAL | 1 refills | Status: DC

## 2018-06-15 MED ORDER — CEFTRIAXONE SODIUM 250 MG IJ SOLR
250.0000 mg | Freq: Once | INTRAMUSCULAR | Status: AC
  Administered 2018-06-15: 18:00:00 250 mg via INTRAMUSCULAR
  Filled 2018-06-15: qty 250

## 2018-06-15 NOTE — MAU Note (Signed)
Pt transfer from ED. With N/V x 1 week.  Had mis carriage last month but now BHCG is over 2000. New pregnancy.

## 2018-06-15 NOTE — MAU Note (Signed)
Sharp pain in lower stomach around to back, started last night. Started vomiting about 3 days ago Got worse, just couldn't take it so she called the ambulance.  Started in ED.  Confirmed preg, blood work and ua ran.

## 2018-06-15 NOTE — ED Triage Notes (Signed)
Pt bib ems for 3 days of n/v/d. Pt unable to keep anything down. Hx of a miscarriage about 1 months ago. Pt a.o, nad noted.

## 2018-06-15 NOTE — ED Provider Notes (Signed)
[facility] EMERGENCY DEPARTMENT Provider Note   CSN: [id number] Arrival date & time: 06/15/18  1347    History   Chief Complaint Chief Complaint  Patient presents with  . Emesis  . Nausea    HPI Christy [name] is a 23 y.o. female.     23 y/o female G2P0 with no PMH presents to the ED with a chief complaint of nausea, vomiting, generalized abdominal pain x 3 days. Patient reports having a miscarriage in the month of April she reports being approximately [redacted] weeks pregnant. Patient had a follow up appointment via virtual visit, reports taking a pregnancy test after which was negative. She reports taking a pregnancy test two days ago which was positive. Patient has not tried anything for her symptoms, reports several episodes of vomiting, non bilious non bloody. She denies any vaginal discharge, urinary symptoms, fever, or shortness of breath.   The history is provided by the patient and medical records.    Past Medical History:  Diagnosis Date  . Medical history non-contributory     There are no active problems to display for this patient.   Past Surgical History:  Procedure Laterality Date  . NO PAST SURGERIES       OB History    Gravida  1   Para  0   Term  0   Preterm  0   AB  0   Living  0     SAB  0   TAB  0   Ectopic  0   Multiple  0   Live Births  0            Home Medications    Prior to Admission medications   Medication Sig Start Date End Date Taking? Authorizing Provider  azithromycin (ZITHROMAX) 500 MG tablet Take 4 tablets (2,000 mg total) by mouth once for 1 dose. 04/29/18 04/29/18  Judeth Horn, NP    Family History No family history on file.  Social History Social History   Tobacco Use  . Smoking status: Never Smoker  . Smokeless tobacco: Never Used  Substance Use Topics  . Alcohol use: No  . Drug use: Not Currently    Types: Marijuana    Comment: stopped using when found out preg      Allergies   Penicillins   Review of Systems Review of Systems  Constitutional: Negative for chills and fever.  HENT: Negative for ear pain and sore throat.   Eyes: Negative for pain and visual disturbance.  Respiratory: Negative for cough and shortness of breath.   Cardiovascular: Negative for chest pain and palpitations.  Gastrointestinal: Positive for abdominal pain, nausea and vomiting. Negative for constipation and diarrhea.  Genitourinary: Negative for dysuria, hematuria, pelvic pain, vaginal bleeding, vaginal discharge and vaginal pain.  Musculoskeletal: Negative for arthralgias and back pain.  Skin: Negative for color change and rash.  Neurological: Negative for seizures and syncope.  All other systems reviewed and are negative.    Physical Exam Updated Vital Signs BP 103/83   Pulse (!) 105   Temp 98.4 F (36.9 C) (Oral)   Resp 16   Ht 5\' 2"  (1.575 m)   Wt 63.5 kg   LMP 03/12/2018   SpO2 99%   BMI 25.61 kg/m   Physical Exam Vitals signs and nursing note reviewed.  Constitutional:      Appearance: Normal appearance. She is not ill-appearing or toxic-appearing.  HENT:     Mouth/Throat:  Mouth: Mucous membranes are dry.     Pharynx: No oropharyngeal exudate.  Neck:     Musculoskeletal: Normal range of motion and neck supple.  Cardiovascular:     Rate and Rhythm: Normal rate.     Pulses: Normal pulses.  Pulmonary:     Effort: Pulmonary effort is normal.     Breath sounds: Normal breath sounds. No wheezing or rhonchi.  Abdominal:     General: Abdomen is flat. Bowel sounds are normal.     Tenderness: There is abdominal tenderness. There is no right CVA tenderness, left CVA tenderness, guarding or rebound.  Musculoskeletal:        General: No tenderness.  Skin:    General: Skin is warm and dry.  Neurological:     Mental Status: She is alert and oriented to person, place, and time.      ED Treatments / Results  Labs (all labs ordered are listed, but  only abnormal results are displayed) Labs Reviewed  COMPREHENSIVE METABOLIC PANEL - Abnormal; Notable for the following components:      Result Value   Potassium 3.2 (*)    CO2 19 (*)    AST 13 (*)    Alkaline Phosphatase 33 (*)    All other components within normal limits  CBC - Abnormal; Notable for the following components:   WBC 13.5 (*)    RDW 11.3 (*)    All other components within normal limits  URINALYSIS, ROUTINE W REFLEX MICROSCOPIC - Abnormal; Notable for the following components:   APPearance HAZY (*)    Hgb urine dipstick SMALL (*)    Ketones, ur 80 (*)    Protein, ur 100 (*)    Bacteria, UA RARE (*)    All other components within normal limits  I-STAT BETA HCG BLOOD, ED ([facility], [facility], [facility] ONLY) - Abnormal; Notable for the following components:   I-stat hCG, quantitative >2,000.0 (*)    All other components within normal limits  LIPASE, BLOOD    EKG None  Radiology No results found.  Procedures Procedures (including critical care time)  Medications Ordered in ED Medications  sodium chloride flush (NS) 0.9 % injection 3 mL (has no administration in time range)     Initial Impression / Assessment and Plan / ED Course  I have reviewed the triage vital signs and the nursing notes.  Pertinent labs & imaging results that were available during my care of the patient were reviewed by me and considered in my medical decision making (see chart for details).   Patient with a recent miscarriage on the month of March with a last quant HCG level at 7 on 05/05/2018 returns to the ED today for n/v/ and generalized Abdominal pain, reports taking a pregnancy test 2 days ago. Has been unprotected sexually active through the month of May. During elevation patient looks dry, otherwise well-appearing.  Laboratory results were remarkable for hypokalemia, suspect this is due to vomiting.  A beta hCG was obtained which showed > 2,000.  Lipase level was within normal limits.  DVC show a  slight increase in white blood cell count at 13.5.  UA was wbc 0-5. Small hgb. No nitrites she denies any vaginal discharge, urinary symptoms or vaginal bleeding. At this time as this is a new pregnancy for patient after miscarriage in April will have patient evaluated at MAU per hospital protocol.  Portions of this note were generated with Scientist, clinical (histocompatibility and immunogenetics). Dictation errors may occur despite best attempts at  proofreading.      Final Clinical Impressions(s) / ED Diagnoses   Final diagnoses:  Non-intractable vomiting with nausea, unspecified vomiting type  Pregnancy, unspecified gestational age    ED Discharge Orders    None       Claude MangesSoto, Kyndell Zeiser, PA-C 06/15/18 1531    Terrilee FilesButler, Michael C, MD 06/16/18 1053

## 2018-06-15 NOTE — MAU Provider Note (Signed)
History     CSN: [id number]  Arrival date and time: 06/15/18 4098   First Provider Initiated Contact with Patient 06/15/18 1656      Chief Complaint  Patient presents with  . Emesis  . Nausea   23 y.o. G2P0010  gestation presenting with 3 day hx of N/V and abd pain. Cannot tolerated anything po. Also reports watery diarrhea 2-3 times a day. No fevers. No sick contacts. Abd pain is bilateral, start in lower abd and radiates to upper abd. Was positive in April for Gonorrhea and reports getting treated with "a pill", states they could not give her the injection because she is allergic to PCN. Partner was treated.   OB History    Gravida  2   Para  0   Term  0   Preterm  0   AB  1   Living  0     SAB  1   TAB  0   Ectopic  0   Multiple  0   Live Births  0           Past Medical History:  Diagnosis Date  . Anemia   . Chlamydia 2018, 2019  . Gonorrhea 2018, 2020  . Infection    UTI  . Medical history non-contributory     Past Surgical History:  Procedure Laterality Date  . NO PAST SURGERIES      Family History  Problem Relation Age of Onset  . Hypertension Mother   . Hypertension Father     Social History   Tobacco Use  . Smoking status: Never Smoker  . Smokeless tobacco: Never Used  Substance Use Topics  . Alcohol use: No  . Drug use: Yes    Types: Marijuana    Comment: last was ? Monday    Allergies:  Allergies  Allergen Reactions  . Penicillins Hives    DID THE REACTION INVOLVE: Swelling of the face/tongue/throat, SOB, or low BP? Yes Sudden or severe rash/hives, skin peeling, or the inside of the mouth or nose? Yes Did it require medical treatment? No When did it last happen?2019 If all above answers are "NO", may proceed with cephalosporin use.    Medications Prior to Admission  Medication Sig Dispense Refill Last Dose  . Prenatal Vit-Fe Fumarate-FA (PRENATAL MULTIVITAMIN) TABS tablet Take 1 tablet by mouth daily  at 12 noon.   06/14/2018 at Unknown time  . azithromycin (ZITHROMAX) 500 MG tablet Take 4 tablets (2,000 mg total) by mouth once for 1 dose. 4 tablet 0     Review of Systems  Constitutional: Negative for fever.  Gastrointestinal: Positive for abdominal pain, diarrhea, nausea and vomiting.  Genitourinary: Negative for dysuria, urgency, vaginal bleeding and vaginal discharge.  Neurological: Positive for dizziness.   Physical Exam   Blood pressure 116/74, pulse 78, temperature 99.1 F (37.3 C), resp. rate 16, height  (1.575 m), weight 63 kg, last menstrual period 03/12/2018, SpO2 99 %, unknown if currently breastfeeding.  Physical Exam  Nursing note and vitals reviewed. Constitutional: She is oriented to person, place, and time. She appears well-developed and well-nourished. No distress.  HENT:  Head: Normocephalic and atraumatic.  Neck: Normal range of motion.  Respiratory: Effort normal. No respiratory distress.  GI: Soft. She exhibits no distension and no mass. There is no abdominal tenderness. There is no rebound and no guarding.  Genitourinary:    Genitourinary Comments: External: no lesions or erythema Vagina: rugated, pink, moist, mod thin yellow discharge  Uterus: non enlarged, anteverted, non tender, no CMT Adnexae: no masses, no tenderness left, no tenderness right Cervix closed    Musculoskeletal: Normal range of motion.  Neurological: She is alert and oriented to person, place, and time.  Skin: Skin is warm and dry.  Psychiatric: She has a normal mood and affect.   Results for orders placed or performed during the hospital encounter of 06/15/18 (from the past 24 hour(s))  Lipase, blood     Status: None   Collection Time: 06/15/18  1:59 PM  Result Value Ref Range   Lipase 28 11 - 51 U/L  Comprehensive metabolic panel     Status: Abnormal   Collection Time: 06/15/18  1:59 PM  Result Value Ref Range   Sodium 137 135 - 145 mmol/L   Potassium 3.2 (L) 3.5 - 5.1 mmol/L    Chloride 105 98 - 111 mmol/L   CO2 19 (L) 22 - 32 mmol/L   Glucose, Bld 86 70 - 99 mg/dL   BUN 6 6 - 20 mg/dL   Creatinine, Ser 1.61 0.44 - 1.00 mg/dL   Calcium 9.3 8.9 - 09.6 mg/dL   Total Protein 7.1 6.5 - 8.1 g/dL   Albumin 4.2 3.5 - 5.0 g/dL   AST 13 (L) 15 - 41 U/L   ALT 10 0 - 44 U/L   Alkaline Phosphatase 33 (L) 38 - 126 U/L   Total Bilirubin 0.9 0.3 - 1.2 mg/dL   GFR calc non Af Amer >60 >60 mL/min   GFR calc Af Amer >60 >60 mL/min   Anion gap 13 5 - 15  CBC     Status: Abnormal   Collection Time: 06/15/18  1:59 PM  Result Value Ref Range   WBC 13.5 (H) 4.0 - 10.5 K/uL   RBC 4.20 3.87 - 5.11 MIL/uL   Hemoglobin 13.5 12.0 - 15.0 g/dL   HCT 04.5 40.9 - 81.1 %   MCV 90.7 80.0 - 100.0 fL   MCH 32.1 26.0 - 34.0 pg   MCHC 35.4 30.0 - 36.0 g/dL   RDW 91.4 (L) 78.2 - 95.6 %   Platelets 288 150 - 400 K/uL   nRBC 0.0 0.0 - 0.2 %  Urinalysis, Routine w reflex microscopic     Status: Abnormal   Collection Time: 06/15/18  1:59 PM  Result Value Ref Range   Color, Urine YELLOW YELLOW   APPearance HAZY (A) CLEAR   Specific Gravity, Urine 1.030 1.005 - 1.030   pH 6.0 5.0 - 8.0   Glucose, UA NEGATIVE NEGATIVE mg/dL   Hgb urine dipstick SMALL (A) NEGATIVE   Bilirubin Urine NEGATIVE NEGATIVE   Ketones, ur 80 (A) NEGATIVE mg/dL   Protein, ur 213 (A) NEGATIVE mg/dL   Nitrite NEGATIVE NEGATIVE   Leukocytes,Ua NEGATIVE NEGATIVE   RBC / HPF 11-20 0 - 5 RBC/hpf   WBC, UA 0-5 0 - 5 WBC/hpf   Bacteria, UA RARE (A) NONE SEEN   Squamous Epithelial / LPF 6-10 0 - 5   Mucus PRESENT   I-Stat beta hCG blood, ED     Status: Abnormal   Collection Time: 06/15/18  2:10 PM  Result Value Ref Range   I-stat hCG, quantitative >2,000.0 (H) <5 mIU/mL   Comment 3          Wet prep, genital     Status: Abnormal   Collection Time: 06/15/18  5:08 PM  Result Value Ref Range   Yeast Wet Prep HPF POC NONE SEEN NONE  SEEN   Trich, Wet Prep NONE SEEN NONE SEEN   Clue Cells Wet Prep HPF POC PRESENT (A)  NONE SEEN   WBC, Wet Prep HPF POC MODERATE (A) NONE SEEN   Sperm NONE SEEN   hCG, quantitative, pregnancy     Status: Abnormal   Collection Time: 06/15/18  5:39 PM  Result Value Ref Range   hCG, Beta Chain, Quant, S 57,609 (H) <5 mIU/mL   US Ob Less Than 14 Weeks With Ob Transvaginal  Result Date: 06/15/2018 CLINICAL DATA:  Pain EXAM: OBSTETRIC <14 WK [place] AND TRANSVAGINAL OB US TECHNIQUE: Both transabdominal and transvaginal ultrasound examinations were performed for complete evaluation of the gestation as well as the maternal uterus, adnexal regions, and pelvic cul-de-sac. Transvaginal technique was performed to assess early pregnancy. COMPARISON:  None. FINDINGS: Intrauterine gestational sac: Single Yolk sac:  Visualized. Embryo:  Visualized. Cardiac Activity: Visualized. Heart Rate: 159 bpm CRL:  4.3 mm   6 w   1 d                  [place] EDC: 02/07/2019 Subchorionic hemorrhage: There is a small amount of subchorionic hemorrhage. Maternal uterus/adnexae: The right ovary measures 3.8 x 2.4 x 1.8 cm. The left ovary measures 4.2 x 2.6 x 4.0 cm. There is likely a corpus luteal cyst within the left ovary. IMPRESSION: Single live IUP at 6 weeks and 1 day. No acute abnormality detected. Electronically Signed   By: Katherine Mantle M.D.   On: 06/15/2018 19:33   MAU Course  Procedures Orders Placed This Encounter  Procedures  . Wet prep, genital    Standing Status:   Standing    Number of Occurrences:   1  . US OB LESS THAN 14 WEEKS WITH OB TRANSVAGINAL    Standing Status:   Standing    Number of Occurrences:   1    Order Specific Question:   Symptom/Reason for Exam    Answer:   Abdominal pain in pregnancy [423536]  . Lipase, blood    Standing Status:   Standing    Number of Occurrences:   1  . Comprehensive metabolic panel    Standing Status:   Standing    Number of Occurrences:   1  . CBC    Standing Status:   Standing    Number of Occurrences:   1  . Urinalysis, Routine w reflex microscopic     Standing Status:   Standing    Number of Occurrences:   1  . hCG, quantitative, pregnancy    Standing Status:   Standing    Number of Occurrences:   1  . Diet NPO time specified    Standing Status:   Standing    Number of Occurrences:   1  . Saline Lock IV, Maintain IV access    Standing Status:   Standing    Number of Occurrences:   1  . I-Stat beta hCG blood, ED    Standing Status:   Standing    Number of Occurrences:   1  . Discharge patient    Order Specific Question:   Discharge disposition    Answer:   01-Home or Self Care [1]    Order Specific Question:   Discharge patient date    Answer:   06/15/2018   Meds ordered this encounter  Medications  . sodium chloride flush (NS) 0.9 % injection 3 mL  . lactated ringers bolus 1,000 mL  . scopolamine (TRANSDERM-SCOP)  1 MG/3DAYS 1.5 mg  . cefTRIAXone (ROCEPHIN) injection 250 mg    Order Specific Question:   Antibiotic Indication:    Answer:   STD  . promethazine (PHENERGAN) injection 25 mg  . scopolamine (TRANSDERM-SCOP) 1 MG/3DAYS    Sig: Place 1 patch (1.5 mg total) onto the skin every 3 (three) days.    Dispense:  10 patch    Refill:  1    Order Specific Question:   Supervising Provider    Answer:   CONSTANT, PEGGY [[id number]]  . promethazine (PHENERGAN) 25 MG tablet    Sig: Take 0.5-1 tablets (12.5-25 mg total) by mouth every 6 (six) hours as needed for nausea or vomiting. Ok to place vaginally if needed    Dispense:  30 tablet    Refill:  1    Order Specific Question:   Supervising Provider    Answer:   CONSTANT, PEGGY [[id number]]   MDM Review of record shows patient was given Rocephin in 2018, I confirmed with her that she did not have a reaction to this med. Rocephin ordered and given. Instructed pt to abstain from IC for 2 weeks or use condoms. Viable IUP on [place]. Pain likely caused from [facility]. Nausea improved, no emesis. Tolerating po. Stable for discharge home.   Assessment and Plan  [redacted] weeks gestation Morning  sickness Dehydration Gonorrhea infection  Discharge home Follow up at [facility] in 4 weeks to start care SAB precautions Rx Scopolamine Rx Phenergan  Allergies as of 06/15/2018      Reactions   Penicillins Hives   DID THE REACTION INVOLVE: Swelling of the face/tongue/throat, SOB, or low BP? Yes Sudden or severe rash/hives, skin peeling, or the inside of the mouth or nose? Yes Did it require medical treatment? No When did it last happen?2019 If all above answers are "NO", may proceed with cephalosporin use.      Medication List    STOP taking these medications   azithromycin 500 MG tablet Commonly known as:  ZITHROMAX     TAKE these medications   prenatal multivitamin Tabs tablet Take 1 tablet by mouth daily at 12 noon.   promethazine 25 MG tablet Commonly known as:  PHENERGAN Take 0.5-1 tablets (12.5-25 mg total) by mouth every 6 (six) hours as needed for nausea or vomiting. Ok to place vaginally if needed   scopolamine 1 MG/3DAYS Commonly known as:  TRANSDERM-SCOP Place 1 patch (1.5 mg total) onto the skin every 3 (three) days. Start taking on:  June 18, 2018      Donette Larry, [place] 06/15/2018, 8:13 PM

## 2018-06-15 NOTE — Progress Notes (Signed)
Melanie Bhambri CNm in earlier to discuss d/c plan. Written and verbal d/c instructions given and understanding voiced

## 2018-06-15 NOTE — Discharge Instructions (Signed)
Morning Sickness  Morning sickness is when you feel sick to your stomach (nauseous) during pregnancy. You may feel sick to your stomach and throw up (vomit). You may feel sick in the morning, but you can feel this way at any time of day. Some women feel very sick to their stomach and cannot stop throwing up (hyperemesis gravidarum). Follow these instructions at home: Medicines  Take over-the-counter and prescription medicines only as told by your doctor. Do not take any medicines until you talk with your doctor about them first.  Taking multivitamins before getting pregnant can stop or lessen the harshness of morning sickness. Eating and drinking  Eat dry toast or crackers before getting out of bed.  Eat 5 or 6 small meals a day.  Eat dry and bland foods like rice and baked potatoes.  Do not eat greasy, fatty, or spicy foods.  Have someone cook for you if the smell of food causes you to feel sick or throw up.  If you feel sick to your stomach after taking prenatal vitamins, take them at night or with a snack.  Eat protein when you need a snack. Nuts, yogurt, and cheese are good choices.  Drink fluids throughout the day.  Try ginger ale made with real ginger, ginger tea made from fresh grated ginger, or ginger candies. General instructions  Do not use any products that have nicotine or tobacco in them, such as cigarettes and e-cigarettes. If you need help quitting, ask your doctor.  Use an air purifier to keep the air in your house free of smells.  Get lots of fresh air.  Try to avoid smells that make you feel sick.  Try: ? Wearing a bracelet that is used for seasickness (acupressure wristband). ? Going to a doctor who puts thin needles into certain body points (acupuncture) to improve how you feel. Contact a doctor if:  You need medicine to feel better.  You feel dizzy or light-headed.  You are losing weight. Get help right away if:  You feel very sick to your  stomach and cannot stop throwing up.  You pass out (faint).  You have very bad pain in your belly. Summary  Morning sickness is when you feel sick to your stomach (nauseous) during pregnancy.  You may feel sick in the morning, but you can feel this way at any time of day.  Making some changes to what you eat may help your symptoms go away. This information is not intended to replace advice given to you by your health care provider. Make sure you discuss any questions you have with your health care provider. Document Released: 02/05/2004 Document Revised: 01/29/2016 Document Reviewed: 01/29/2016 Elsevier Interactive Patient Education  2019 Elsevier Inc.    Abdominal Pain During Pregnancy  Abdominal pain is common during pregnancy, and has many possible causes. Some causes are more serious than others, and sometimes the cause is not known. Abdominal pain can be a sign that labor is starting. It can also be caused by normal growth and stretching of muscles and ligaments during pregnancy. Always tell your health care provider if you have any abdominal pain. Follow these instructions at home:  Do not have sex or put anything in your vagina until your pain goes away completely.  Get plenty of rest until your pain improves.  Drink enough fluid to keep your urine pale yellow.  Take over-the-counter and prescription medicines only as told by your health care provider.  Keep all follow-up visits as  told by your health care provider. This is important. °Contact a health care provider if: °· Your pain continues or gets worse after resting. °· You have lower abdominal pain that: °? Comes and goes at regular intervals. °? Spreads to your back. °? Is similar to menstrual cramps. °· You have pain or burning when you urinate. °Get help right away if: °· You have a fever or chills. °· You have vaginal bleeding. °· You are leaking fluid from your vagina. °· You are passing tissue from your  vagina. °· You have vomiting or diarrhea that lasts for more than 24 hours. °· Your baby is moving less than usual. °· You feel very weak or faint. °· You have shortness of breath. °· You develop severe pain in your upper abdomen. °Summary °· Abdominal pain is common during pregnancy, and has many possible causes. °· If you experience abdominal pain during pregnancy, tell your health care provider right away. °· Follow your health care provider's home care instructions and keep all follow-up visits as directed. °This information is not intended to replace advice given to you by your health care provider. Make sure you discuss any questions you have with your health care provider. °Document Released: 12/28/2004 Document Revised: 04/01/2016 Document Reviewed: 04/01/2016 °Elsevier Interactive Patient Education © 2019 Elsevier Inc. ° °

## 2018-06-16 LAB — GC/CHLAMYDIA PROBE AMP (~~LOC~~) NOT AT ARMC
Chlamydia: NEGATIVE
Neisseria Gonorrhea: NEGATIVE

## 2018-06-26 ENCOUNTER — Inpatient Hospital Stay (HOSPITAL_COMMUNITY)
Admission: AD | Admit: 2018-06-26 | Discharge: 2018-06-27 | Disposition: A | Discharge status: Home / Self Care | Payer: Medicaid Other | Attending: Obstetrics & Gynecology | Admitting: Obstetrics & Gynecology

## 2018-06-26 ENCOUNTER — Encounter (HOSPITAL_COMMUNITY): Payer: Self-pay

## 2018-06-26 ENCOUNTER — Other Ambulatory Visit: Payer: Self-pay

## 2018-06-26 DIAGNOSIS — Z3A01 Less than 8 weeks gestation of pregnancy: Secondary | ICD-10-CM | POA: Insufficient documentation

## 2018-06-26 DIAGNOSIS — O210 Mild hyperemesis gravidarum: Secondary | ICD-10-CM | POA: Insufficient documentation

## 2018-06-26 DIAGNOSIS — O219 Vomiting of pregnancy, unspecified: Secondary | ICD-10-CM

## 2018-06-26 LAB — COMPREHENSIVE METABOLIC PANEL WITH GFR
ALT: 13 U/L (ref 0–44)
AST: 14 U/L — ABNORMAL LOW (ref 15–41)
Albumin: 4 g/dL (ref 3.5–5.0)
Alkaline Phosphatase: 32 U/L — ABNORMAL LOW (ref 38–126)
Anion gap: 11 (ref 5–15)
BUN: 6 mg/dL (ref 6–20)
CO2: 22 mmol/L (ref 22–32)
Calcium: 9.4 mg/dL (ref 8.9–10.3)
Chloride: 102 mmol/L (ref 98–111)
Creatinine, Ser: 0.7 mg/dL (ref 0.44–1.00)
GFR calc Af Amer: >60 mL/min
GFR calc non Af Amer: >60 mL/min
Glucose, Bld: 84 mg/dL (ref 70–99)
Potassium: 3.8 mmol/L (ref 3.5–5.1)
Sodium: 135 mmol/L (ref 135–145)
Total Bilirubin: 0.9 mg/dL (ref 0.3–1.2)
Total Protein: 6.9 g/dL (ref 6.5–8.1)

## 2018-06-26 LAB — CBC
HCT: 38.3 % (ref 36.0–46.0)
Hemoglobin: 13.6 g/dL (ref 12.0–15.0)
MCH: 31.8 pg (ref 26.0–34.0)
MCHC: 35.5 g/dL (ref 30.0–36.0)
MCV: 89.5 fL (ref 80.0–100.0)
Platelets: 253 K/uL (ref 150–400)
RBC: 4.28 MIL/uL (ref 3.87–5.11)
RDW: 11 % — ABNORMAL LOW (ref 11.5–15.5)
WBC: 13.8 K/uL — ABNORMAL HIGH (ref 4.0–10.5)
nRBC: 0 % (ref 0.0–0.2)

## 2018-06-26 MED ORDER — LACTATED RINGERS IV BOLUS
1000.0000 mL | Freq: Once | INTRAVENOUS | Status: AC
  Administered 2018-06-26: 1000 mL via INTRAVENOUS

## 2018-06-26 MED ORDER — PROCHLORPERAZINE EDISYLATE 10 MG/2ML IJ SOLN
10.0000 mg | Freq: Once | INTRAMUSCULAR | Status: AC
  Administered 2018-06-26: 10 mg via INTRAVENOUS
  Filled 2018-06-26: qty 2

## 2018-06-26 MED ORDER — PROCHLORPERAZINE MALEATE 10 MG PO TABS: 10.0000 mg | ORAL_TABLET | Freq: Two times a day (BID) | ORAL | 0 refills | Status: DC | PRN

## 2018-06-26 MED ORDER — ONDANSETRON 4 MG PO TBDP: 4.0000 mg | ORAL_TABLET | Freq: Three times a day (TID) | ORAL | 1 refills | Status: DC | PRN

## 2018-06-26 MED ORDER — FAMOTIDINE IN NACL 20-0.9 MG/50ML-% IV SOLN
20.0000 mg | Freq: Once | INTRAVENOUS | Status: AC
  Administered 2018-06-26: 20 mg via INTRAVENOUS
  Filled 2018-06-26: qty 50

## 2018-06-26 MED ORDER — M.V.I. ADULT IV INJ
Freq: Once | INTRAVENOUS | Status: AC
  Administered 2018-06-26: 23:00:00 via INTRAVENOUS
  Filled 2018-06-26: qty 10

## 2018-06-26 NOTE — MAU Provider Note (Addendum)
History     CSN: [id number]  Arrival date and time: 06/26/18 2059   First Provider Initiated Contact with Patient 06/26/18 2141       Chief Complaint  Patient presents with  . Emesis   Christy [name] is a 23 y.o. G2P0 at [redacted]w[redacted]d who presents to MAU with complaints of continued N/V during pregnancy. Patient has been seen recently in MAU for same complaint and was prescribed Phenergan and Scopolamine patch. Patient reports Phenergan stopped working after 2 days of use and never picked up scope patch d/t cost. Since taking phenergan she has tried Zofran at home (mother's Rx), which she reports has helped but she continues to feel nauseous, ran out of zofran last week. She reports vomiting every other hour throughout the day today. Has not been able to keep anything down food or liquid in the past 2-3 days. Vomiting is associated with one nose bleed yesterday when emesis came up through her nose. She reports feeling lightheaded and dizzy over the past 2-3 days. Denies vaginal bleeding, reports abdominal pain after vomiting.    OB History    Gravida  2   Para  0   Term  0   Preterm  0   AB  1   Living  0     SAB  1   TAB  0   Ectopic  0   Multiple  0   Live Births  0           Past Medical History:  Diagnosis Date  . Anemia   . Chlamydia 2018, 2019  . Gonorrhea 2018, 2020  . Infection    UTI  . Medical history non-contributory     Past Surgical History:  Procedure Laterality Date  . NO PAST SURGERIES      Family History  Problem Relation Age of Onset  . Hypertension Mother   . Hypertension Father     Social History   Tobacco Use  . Smoking status: Never Smoker  . Smokeless tobacco: Never Used  Substance Use Topics  . Alcohol use: No  . Drug use: Yes    Types: Marijuana    Comment: last was ? Monday    Allergies:  Allergies  Allergen Reactions  . Penicillins Hives    DID THE REACTION INVOLVE: Swelling of the face/tongue/throat, SOB, or  low BP? Yes Sudden or severe rash/hives, skin peeling, or the inside of the mouth or nose? Yes Did it require medical treatment? No When did it last happen?2019 If all above answers are "NO", may proceed with cephalosporin use.    Medications Prior to Admission  Medication Sig Dispense Refill Last Dose  . Prenatal Vit-Fe Fumarate-FA (PRENATAL MULTIVITAMIN) TABS tablet Take 1 tablet by mouth daily at 12 noon.   Past Week at Unknown time  . promethazine (PHENERGAN) 25 MG tablet Take 0.5-1 tablets (12.5-25 mg total) by mouth every 6 (six) hours as needed for nausea or vomiting. Ok to place vaginally if needed 30 tablet 1 Past Week at Unknown time  . scopolamine (TRANSDERM-SCOP) 1 MG/3DAYS Place 1 patch (1.5 mg total) onto the skin every 3 (three) days. 10 patch 1     Review of Systems  Constitutional: Negative.   Respiratory: Negative.   Cardiovascular: Negative.   Gastrointestinal: Positive for abdominal pain, nausea and vomiting. Negative for constipation and diarrhea.  Genitourinary: Negative.   Musculoskeletal: Negative.   Neurological: Positive for dizziness and light-headedness. Negative for syncope and headaches.  Physical Exam   Blood pressure 95/64, pulse 80, temperature 98.2 F (36.8 C), temperature source Oral, resp. rate 20, height 5\' 2"  (1.575 m), weight 61.2 kg, last menstrual period 03/12/2018, unknown if currently breastfeeding.  Physical Exam  Nursing note and vitals reviewed. Constitutional: She is oriented to person, place, and time. She appears well-developed and well-nourished. No distress.  Cardiovascular: Normal rate and regular rhythm.  Respiratory: Effort normal and breath sounds normal. No respiratory distress. She has no wheezes. She has no rales.  GI: Soft. There is abdominal tenderness. There is no rebound and no guarding.  Tenderness in suprapubic area   Neurological: She is alert and oriented to person, place, and time.  Skin: Skin is warm and  dry. There is pallor.  Mucous membranes dry   Psychiatric: She has a normal mood and affect. Her behavior is normal. Thought content normal.   Pt informed that the ultrasound is considered a limited OB ultrasound and is not intended to be a complete ultrasound exam.  Patient also informed that the ultrasound is not being completed with the intent of assessing for fetal or placental anomalies or any pelvic abnormalities.  Explained that the purpose of today's ultrasound is to assess for  viability.  Patient acknowledges the purpose of the exam and the limitations of the study.    FHR 156 by bedside US  MAU Course  Procedures  MDM Orders Placed This Encounter  Procedures  . Urinalysis, Routine w reflex microscopic  . CBC  . Comprehensive metabolic panel  . Insert peripheral IV   Meds ordered this encounter  Medications  . lactated ringers bolus 1,000 mL  . multivitamins adult (INFUVITE ADULT) 10 mL in lactated ringers 1,000 mL infusion  . prochlorperazine (COMPAZINE) injection 10 mg  . famotidine (PEPCID) IVPB 20 mg premix  . prochlorperazine (COMPAZINE) 10 MG tablet    Sig: Take 1 tablet (10 mg total) by mouth 2 (two) times daily as needed for nausea or vomiting.    Dispense:  20 tablet    Refill:  0    Order Specific Question:   Supervising Provider    Answer:   Despina HiddenEURE, LUTHER H [[id number]]  . ondansetron (ZOFRAN ODT) 4 MG disintegrating tablet    Sig: Take 1 tablet (4 mg total) by mouth every 8 (eight) hours as needed for nausea or vomiting.    Dispense:  30 tablet    Refill:  1    Order Specific Question:   Supervising Provider    Answer:   Lazaro ArmsEURE, LUTHER H [[id number]]   Labs reviewed:  Results for orders placed or performed during the hospital encounter of 06/26/18 (from the past 24 hour(s))  CBC     Status: Abnormal   Collection Time: 06/26/18  9:48 PM  Result Value Ref Range   WBC 13.8 (H) 4.0 - 10.5 K/uL   RBC 4.28 3.87 - 5.11 MIL/uL   Hemoglobin 13.6 12.0 - 15.0 g/dL   HCT 91.438.3  78.236.0 - 95.646.0 %   MCV 89.5 80.0 - 100.0 fL   MCH 31.8 26.0 - 34.0 pg   MCHC 35.5 30.0 - 36.0 g/dL   RDW 21.311.0 (L) 08.611.5 - 57.815.5 %   Platelets 253 150 - 400 K/uL   nRBC 0.0 0.0 - 0.2 %  Comprehensive metabolic panel     Status: Abnormal   Collection Time: 06/26/18  9:48 PM  Result Value Ref Range   Sodium 135 135 - 145 mmol/L   Potassium 3.8 3.5 -  5.1 mmol/L   Chloride 102 98 - 111 mmol/L   CO2 22 22 - 32 mmol/L   Glucose, Bld 84 70 - 99 mg/dL   BUN 6 6 - 20 mg/dL   Creatinine, Ser 1.610.70 0.44 - 1.00 mg/dL   Calcium 9.4 8.9 - 09.610.3 mg/dL   Total Protein 6.9 6.5 - 8.1 g/dL   Albumin 4.0 3.5 - 5.0 g/dL   AST 14 (L) 15 - 41 U/L   ALT 13 0 - 44 U/L   Alkaline Phosphatase 32 (L) 38 - 126 U/L   Total Bilirubin 0.9 0.3 - 1.2 mg/dL   GFR calc non Af Amer >60 >60 mL/min   GFR calc Af Amer >60 >60 mL/min   Anion gap 11 5 - 15   Treatments in MAU included IV LR bolus, pepcid IV, compazine IV and banana bag.  BS US performed for fetal viability- FHR 156  No electrolyte abnormalities, educated and discussed changes in medication therapy. Instructed to take compazine for next 10 days then can switch to Zofran at 9-10 weeks- patient verbalizes understanding. Rx for Compazine and Zofran sent to pharmacy.   Encouraged to make appointment to initiate prenatal care. Patient plans to call in the morning. Discussed reasons to return to MAU. Pt stable at time of discharge.   Assessment and Plan   1. Nausea and vomiting during pregnancy   2. [redacted] weeks gestation of pregnancy    Discharge home  Make appointment with [facility] to initiate prenatal care  Return to MAU as needed  Rx for Compazine and Zofran   Sharyon CableVeronica C Aariona Momon CNM 06/26/2018, 11:57 PM

## 2018-06-26 NOTE — MAU Note (Signed)
PT SAYS  SHE VOMITS EVERY OTHER HR TODAY.  PHENERGAN- TOOK LAST TIME HERE.   ZOFRAN - WORKS GOOD  BUT  RAN OUT- 1 WEEK AGO.  NO APPOINTMENT  FOR [facility]- YET.  CHILLS  WHEN SHE VOMITS.  AND SHAKING  WHEN VOMITS.  HAD NOSE BLOOD- AFTER VOMIT CAME THROUGH NOSE.

## 2018-06-26 NOTE — Discharge Instructions (Signed)
Morning Sickness    Morning sickness is when you feel sick to your stomach (nauseous) during pregnancy. You may feel sick to your stomach and throw up (vomit). You may feel sick in the morning, but you can feel this way at any time of day. Some women feel very sick to their stomach and cannot stop throwing up (hyperemesis gravidarum).  Follow these instructions at home:  Medicines   Take over-the-counter and prescription medicines only as told by your doctor. Do not take any medicines until you talk with your doctor about them first.   Taking multivitamins before getting pregnant can stop or lessen the harshness of morning sickness.  Eating and drinking   Eat dry toast or crackers before getting out of bed.   Eat 5 or 6 small meals a day.   Eat dry and bland foods like rice and baked potatoes.   Do not eat greasy, fatty, or spicy foods.   Have someone cook for you if the smell of food causes you to feel sick or throw up.   If you feel sick to your stomach after taking prenatal vitamins, take them at night or with a snack.   Eat protein when you need a snack. Nuts, yogurt, and cheese are good choices.   Drink fluids throughout the day.   Try ginger ale made with real ginger, ginger tea made from fresh grated ginger, or ginger candies.  General instructions   Do not use any products that have nicotine or tobacco in them, such as cigarettes and e-cigarettes. If you need help quitting, ask your doctor.   Use an air purifier to keep the air in your house free of smells.   Get lots of fresh air.   Try to avoid smells that make you feel sick.   Try:  ? Wearing a bracelet that is used for seasickness (acupressure wristband).  ? Going to a doctor who puts thin needles into certain body points (acupuncture) to improve how you feel.  Contact a doctor if:   You need medicine to feel better.   You feel dizzy or light-headed.   You are losing weight.  Get help right away if:   You feel very sick to your  stomach and cannot stop throwing up.   You pass out (faint).   You have very bad pain in your belly.  Summary   Morning sickness is when you feel sick to your stomach (nauseous) during pregnancy.   You may feel sick in the morning, but you can feel this way at any time of day.   Making some changes to what you eat may help your symptoms go away.  This information is not intended to replace advice given to you by your health care provider. Make sure you discuss any questions you have with your health care provider.  Document Released: 02/05/2004 Document Revised: 01/29/2016 Document Reviewed: 01/29/2016  Elsevier Interactive Patient Education  2019 Elsevier Inc[employer]

## 2018-06-29 ENCOUNTER — Other Ambulatory Visit: Payer: Self-pay

## 2018-06-29 ENCOUNTER — Inpatient Hospital Stay (HOSPITAL_COMMUNITY)
Admission: AD | Admit: 2018-06-29 | Discharge: 2018-06-29 | Disposition: A | Discharge status: Home / Self Care | Payer: Medicaid Other | Attending: Obstetrics and Gynecology | Admitting: Obstetrics and Gynecology

## 2018-06-29 ENCOUNTER — Encounter (HOSPITAL_COMMUNITY): Payer: Self-pay | Admitting: Obstetrics and Gynecology

## 2018-06-29 DIAGNOSIS — O210 Mild hyperemesis gravidarum: Secondary | ICD-10-CM | POA: Diagnosis not present

## 2018-06-29 DIAGNOSIS — K219 Gastro-esophageal reflux disease without esophagitis: Secondary | ICD-10-CM | POA: Diagnosis not present

## 2018-06-29 DIAGNOSIS — O99611 Diseases of the digestive system complicating pregnancy, first trimester: Secondary | ICD-10-CM | POA: Diagnosis not present

## 2018-06-29 DIAGNOSIS — Z3A01 Less than 8 weeks gestation of pregnancy: Secondary | ICD-10-CM

## 2018-06-29 LAB — URINALYSIS, ROUTINE W REFLEX MICROSCOPIC
Bilirubin Urine: NEGATIVE
Glucose, UA: NEGATIVE mg/dL
Ketones, ur: 80 mg/dL — AB
Leukocytes,Ua: NEGATIVE
Nitrite: NEGATIVE
Protein, ur: 100 mg/dL — AB
Specific Gravity, Urine: 1.029 (ref 1.005–1.030)
pH: 5 (ref 5.0–8.0)

## 2018-06-29 MED ORDER — PROMETHAZINE HCL 25 MG/ML IJ SOLN
25.0000 mg | INTRAVENOUS | Status: AC
  Administered 2018-06-29: 19:00:00 25 mg via INTRAVENOUS
  Filled 2018-06-29: qty 1

## 2018-06-29 MED ORDER — PANTOPRAZOLE SODIUM 20 MG PO TBEC: 20.0000 mg | DELAYED_RELEASE_TABLET | Freq: Every day | ORAL | 0 refills | Status: DC

## 2018-06-29 MED ORDER — SCOPOLAMINE 1 MG/3DAYS TD PT72
1.0000 | MEDICATED_PATCH | Freq: Once | TRANSDERMAL | Status: DC
  Administered 2018-06-29: 1.5 mg via TRANSDERMAL
  Filled 2018-06-29: qty 1

## 2018-06-29 MED ORDER — FAMOTIDINE IN NACL 20-0.9 MG/50ML-% IV SOLN
20.0000 mg | Freq: Once | INTRAVENOUS | Status: AC
  Administered 2018-06-29: 19:00:00 20 mg via INTRAVENOUS
  Filled 2018-06-29: qty 50

## 2018-06-29 MED ORDER — M.V.I. ADULT IV INJ
Freq: Once | INTRAVENOUS | Status: AC
  Administered 2018-06-29: 21:00:00 via INTRAVENOUS
  Filled 2018-06-29: qty 1000

## 2018-06-29 MED ORDER — VITAMIN B-6 50 MG PO TABS: 50.0000 mg | ORAL_TABLET | Freq: Two times a day (BID) | ORAL | 0 refills | Status: DC | PRN

## 2018-06-29 NOTE — MAU Note (Signed)
Pt reports she has had vomiting non stop since this morning. Been taking compazine but can't keep it down.

## 2018-06-29 NOTE — Discharge Instructions (Signed)
Hyperemesis Gravidarum °Hyperemesis gravidarum is a severe form of nausea and vomiting that happens during pregnancy. Hyperemesis is worse than morning sickness. It may cause you to have nausea or vomiting all day for many days. It may keep you from eating and drinking enough food and liquids, which can lead to dehydration, malnutrition, and weight loss. Hyperemesis usually occurs during the first half (the first 20 weeks) of pregnancy. It often goes away once a woman is in her second half of pregnancy. However, sometimes hyperemesis continues through an entire pregnancy. °What are the causes? °The cause of this condition is not known. It may be related to changes in chemicals (hormones) in the body during pregnancy, such as the high level of pregnancy hormone (human chorionic gonadotropin) or the increase in the female sex hormone (estrogen). °What are the signs or symptoms? °Symptoms of this condition include: °· Nausea that does not go away. °· Vomiting that does not allow you to keep any food down. °· Weight loss. °· Body fluid loss (dehydration). °· Having no desire to eat, or not liking food that you have previously enjoyed. °How is this diagnosed? °This condition may be diagnosed based on: °· A physical exam. °· Your medical history. °· Your symptoms. °· Blood tests. °· Urine tests. °How is this treated? °This condition is managed by controlling symptoms. This may include: °· Following an eating plan. This can help lessen nausea and vomiting. °· Taking prescription medicines. °An eating plan and medicines are often used together to help control symptoms. If medicines do not help relieve nausea and vomiting, you may need to receive fluids through an IV at the hospital. °Follow these instructions at home: °Eating and drinking ° °· Avoid the following: °? Drinking fluids with meals. Try not to drink anything during the 30 minutes before and after your meals. °? Drinking more than 1 cup of fluid at a  time. °? Eating foods that trigger your symptoms. These may include spicy foods, coffee, high-fat foods, very sweet foods, and acidic foods. °? Skipping meals. Nausea can be more intense on an empty stomach. If you cannot tolerate food, do not force it. Try sucking on ice chips or other frozen items and make up for missed calories later. °? Lying down within 2 hours after eating. °? Being exposed to environmental triggers. These may include food smells, smoky rooms, closed spaces, rooms with strong smells, warm or humid places, overly loud and noisy rooms, and rooms with motion or flickering lights. Try eating meals in a well-ventilated area that is free of strong smells. °? Quick and sudden changes in your movement. °? Taking iron pills and multivitamins that contain iron. If you take prescription iron pills, do not stop taking them unless your health care provider approves. °? Preparing food. The smell of food can spoil your appetite or trigger nausea. °· To help relieve your symptoms: °? Listen to your body. Everyone is different and has different preferences. Find what works best for you. °? Eat and drink slowly. °? Eat 5-6 small meals daily instead of 3 large meals. Eating small meals and snacks can help you avoid an empty stomach. °? In the morning, before getting out of bed, eat a couple of crackers to avoid moving around on an empty stomach. °? Try eating starchy foods as these are usually tolerated well. Examples include cereal, toast, bread, potatoes, pasta, rice, and pretzels. °? Include at least 1 serving of protein with your meals and snacks. Protein options include   lean meats, poultry, seafood, beans, nuts, nut butters, eggs, cheese, and yogurt. ? Try eating a protein-rich snack before bed. Examples of a protein-rick snack include cheese and crackers or a peanut butter sandwich made with 1 slice of whole-wheat bread and 1 tsp (5 g) of peanut butter. ? Eat or suck on things that have ginger in them.  It may help relieve nausea. Add  tsp ground ginger to hot tea or choose ginger tea. ? Try drinking 100% fruit juice or an electrolyte drink. An electrolyte drink contains sodium, potassium, and chloride. ? Drink fluids that are cold, clear, and carbonated or sour. Examples include lemonade, ginger ale, lemon-lime soda, ice water, and sparkling water. ? Brush your teeth or use a mouth rinse after meals. ? Talk with your health care provider about starting a supplement of vitamin B6. General instructions  Take over-the-counter and prescription medicines only as told by your health care provider.  Follow instructions from your health care provider about eating or drinking restrictions.  Continue to take your prenatal vitamins as told by your health care provider. If you are having trouble taking your prenatal vitamins, talk with your health care provider about different options.  Keep all follow-up and pre-birth (prenatal) visits as told by your health care provider. This is important. Contact a health care provider if:  You have pain in your abdomen.  You have a severe headache.  You have vision problems.  You are losing weight.  You feel weak or dizzy. Get help right away if:  You cannot drink fluids without vomiting.  You vomit blood.  You have constant nausea and vomiting.  You are very weak.  You faint.  You have a fever and your symptoms suddenly get worse. Summary  Hyperemesis gravidarum is a severe form of nausea and vomiting that happens during pregnancy.  Making some changes to your eating habits may help relieve nausea and vomiting.  This condition may be managed with medicine.  If medicines do not help relieve nausea and vomiting, you may need to receive fluids through an IV at the hospital. This information is not intended to replace advice given to you by your health care provider. Make sure you discuss any questions you have with your health care  provider. Document Released: 12/28/2004 Document Revised: 01/17/2017 Document Reviewed: 08/27/2015 Elsevier Interactive Patient Education  2019 Elsevier Inc.  [facility]    [facility]       Phone: [phone]  [facility] [place]   Phone: [phone]  [facility] [place]  Phone: [phone]  [facility]  Phone: [phone]  [facility] [place]  Phone: [phone]  [facility]   Phone: [phone]  [facility]       Phone: [phone]  [facility]    Phone: [phone]   [facility]    Phone: [phone]  [facility]    Phone: [phone]  [facility]    Phone: [phone]  [facility]       Phone: [phone]   [facility]  Phone: [phone]  [facility]    Phone: [phone]  [facility]   Phone: [phone]  [facility]      Phone: [phone]  [facility]  Phone: [phone]  Safe Medications in Pregnancy   Acne: Benzoyl Peroxide Salicylic Acid  Backache/Headache: Tylenol: 2 regular strength every 4 hours OR              2 Extra strength every 6 hours  Colds/Coughs/Allergies: Benadryl (alcohol free) 25 mg every 6 hours as needed Breath right strips Claritin Cepacol throat lozenges Chloraseptic throat spray Cold-Eeze- up to three times per day Cough drops, alcohol free Flonase (by prescription only) Guaifenesin Mucinex Robitussin DM (plain only, alcohol free) Saline nasal spray/drops Sudafed (pseudoephedrine) & Actifed ** use only after [redacted] weeks gestation and if you do not have high blood  pressure Tylenol Vicks Vaporub Zinc lozenges Zyrtec   Constipation: Colace Ducolax suppositories Fleet enema Glycerin suppositories Metamucil Milk of magnesia Miralax Senokot Smooth move tea  Diarrhea: Kaopectate Imodium A-D  *NO pepto Bismol  Hemorrhoids: Anusol Anusol HC Preparation H Tucks  Indigestion: Tums Maalox Mylanta Zantac  Pepcid  Insomnia: Benadryl (alcohol free) 25mg  every 6 hours as needed Tylenol PM Unisom, no Gelcaps  Leg Cramps: Tums MagGel  Nausea/Vomiting:  Bonine Dramamine Emetrol Ginger extract Sea bands Meclizine  Nausea medication to take during pregnancy:  Unisom (doxylamine succinate 25 mg tablets) Take one tablet daily at bedtime. If symptoms are not adequately controlled, the dose can be increased to a maximum recommended dose of two tablets daily (1/2 tablet in the morning, 1/2 tablet mid-afternoon and one at bedtime). Vitamin B6 100mg  tablets. Take one tablet twice a day (up to 200 mg per day).  Skin Rashes: Aveeno products Benadryl cream or 25mg  every 6 hours as needed Calamine Lotion 1% cortisone cream  Yeast infection: Gyne-lotrimin 7 Monistat 7  Gum/tooth pain: Anbesol  **If taking multiple medications, please check labels to avoid duplicating the same active ingredients **take medication as directed on the label ** Do not exceed 4000 mg of tylenol in 24 hours **Do not take medications that contain aspirin or ibuprofen

## 2018-06-29 NOTE — MAU Provider Note (Addendum)
History     CSN: [id number]  Arrival date and time: 06/29/18 1634   First Provider Initiated Contact with Patient 06/29/18 1717      Chief Complaint  Patient presents with  . Emesis   HPI  Ms.  Christy [name] is a 23 y.o. year old G32P0010 female at [redacted]w[redacted]d weeks gestation who presents to MAU reporting vomiting "non-stop" since this AM. She reports taking Compazine, but she can't keep it down. She was last seen in MAU on 06/26/18 for the same complaints. She states that Phenergan doesn't work. Zofran was helping with vomiting not nausea, but she ran out. She did not pick up the Rx for Scopolamine, because of the cost. She last ate chicken noodle soup at 0800 this AM; vomited it up. She last drank H2O and sucked on ice chips at 1500; vomited that up. She also reports having constipation; last BM was this AM.   Past Medical History:  Diagnosis Date  . Anemia   . Chlamydia 2018, 2019  . Gonorrhea 2018, 2020  . Infection    UTI  . Medical history non-contributory     Past Surgical History:  Procedure Laterality Date  . NO PAST SURGERIES      Family History  Problem Relation Age of Onset  . Hypertension Mother   . Hypertension Father     Social History   Tobacco Use  . Smoking status: Never Smoker  . Smokeless tobacco: Never Used  Substance Use Topics  . Alcohol use: No  . Drug use: Yes    Types: Marijuana    Comment: last was in May 2020    Allergies:  Allergies  Allergen Reactions  . Penicillins Hives    DID THE REACTION INVOLVE: Swelling of the face/tongue/throat, SOB, or low BP? Yes Sudden or severe rash/hives, skin peeling, or the inside of the mouth or nose? Yes Did it require medical treatment? No When did it last happen?2019 If all above answers are "NO", may proceed with cephalosporin use.    Medications Prior to Admission  Medication Sig Dispense Refill Last Dose  . prochlorperazine (COMPAZINE) 10 MG tablet Take 1 tablet (10 mg total) by  mouth 2 (two) times daily as needed for nausea or vomiting. 20 tablet 0 06/29/2018 at Unknown time  . ondansetron (ZOFRAN ODT) 4 MG disintegrating tablet Take 1 tablet (4 mg total) by mouth every 8 (eight) hours as needed for nausea or vomiting. 30 tablet 1   . Prenatal Vit-Fe Fumarate-FA (PRENATAL MULTIVITAMIN) TABS tablet Take 1 tablet by mouth daily at 12 noon.       Review of Systems  Constitutional: Positive for appetite change and fatigue.  HENT: Negative.   Eyes: Negative.   Respiratory: Negative.   Cardiovascular: Negative.   Gastrointestinal: Positive for constipation, nausea and vomiting.  Endocrine: Negative.   Musculoskeletal: Negative.   Skin: Negative.   Allergic/Immunologic: Negative.   Neurological: Positive for weakness.  Hematological: Negative.   Psychiatric/Behavioral: Negative.    Physical Exam   Blood pressure 110/85, pulse 86, temperature 98.4 F (36.9 C), temperature source Oral, resp. rate 16, height 5\' 2"  (1.575 m), weight 60.3 kg, last menstrual period 03/12/2018, SpO2 99 %.  Physical Exam  Nursing note and vitals reviewed. Constitutional: She is oriented to person, place, and time. She appears well-developed.  HENT:  Head: Normocephalic and atraumatic.  Eyes: Pupils are equal, round, and reactive to light.  Neck: Normal range of motion.  Cardiovascular: Normal rate.  Respiratory: Effort  normal.  GI: Soft. Bowel sounds are decreased.  Genitourinary:    Genitourinary Comments: Pelvic deferred   Musculoskeletal: Normal range of motion.  Neurological: She is alert and oriented to person, place, and time.  Skin: Skin is warm and dry.  Psychiatric: She has a normal mood and affect. Her behavior is normal. Judgment and thought content normal.    MAU Course  Procedures  MDM CCUA IVFs: Phenergan 25 mg in LR 1000 ml @ 999 ml/hr; followed by MVI in LR 1000 ml @ 500 ml/hr --  nausea/vomiting Pepcid 20 mg IVPB --  Scopolamine patch x 1 --  PO Challenge  -- patient tolerated well / did not tolerate   Results for orders placed or performed during the hospital encounter of 06/29/18 (from the past 24 hour(s))  Urinalysis, Routine w reflex microscopic     Status: Abnormal   Collection Time: 06/29/18  5:28 PM  Result Value Ref Range   Color, Urine YELLOW YELLOW   APPearance HAZY (A) CLEAR   Specific Gravity, Urine 1.029 1.005 - 1.030   pH 5.0 5.0 - 8.0   Glucose, UA NEGATIVE NEGATIVE mg/dL   Hgb urine dipstick MODERATE (A) NEGATIVE   Bilirubin Urine NEGATIVE NEGATIVE   Ketones, ur 80 (A) NEGATIVE mg/dL   Protein, ur 696100 (A) NEGATIVE mg/dL   Nitrite NEGATIVE NEGATIVE   Leukocytes,Ua NEGATIVE NEGATIVE   RBC / HPF 6-10 0 - 5 RBC/hpf   WBC, UA 0-5 0 - 5 WBC/hpf   Bacteria, UA MANY (A) NONE SEEN   Squamous Epithelial / LPF 11-20 0 - 5   Mucus PRESENT     Report given to and care assumed by Wynelle BourgeoisMarie Baili Stang, CNM @ 2040  Raelyn Moraolitta Dawson, MSN, CNM 06/29/2018, 5:17 PM  Assessment and Plan  Tolerating PO intake now Feels better  A; SIUP at 2622w1d Nausea and vomiting, possible hyperemesis  P: Discharge home Has rx for zofran and compazine Has rx for scope patch Rx Protonix for acid reflux Rx Vit B6 for nausea Proof of pregnancy letter provided Recommend starting prenatal care Encouraged to return here or to other Urgent Care/ED if she develops worsening of symptoms, increase in pain, fever, or other concerning symptoms.    Aviva SignsWilliams, Duward Allbritton L, CNM

## 2018-06-30 LAB — CULTURE, OB URINE: Culture: <10000 — AB

## 2018-07-03 ENCOUNTER — Telehealth: Payer: Self-pay | Admitting: General Practice

## 2018-07-03 NOTE — Telephone Encounter (Signed)
Appt rescheduled due to RN not available on Tuesday, 07/04/2018.  Pt rescheduled on Monday, 07/10/2018 at 2pm.  Left message on VM for pt to give our office a call back.

## 2018-07-10 ENCOUNTER — Telehealth: Payer: Self-pay | Admitting: General Practice

## 2018-07-10 NOTE — Telephone Encounter (Signed)
Unable to reach patient check in for Telehealth appt with RN.  Left message on VM for patient to give our office a call.

## 2018-07-18 ENCOUNTER — Inpatient Hospital Stay (HOSPITAL_COMMUNITY)
Admission: AD | Admit: 2018-07-18 | Discharge: 2018-07-18 | Disposition: A | Discharge status: Home / Self Care | Payer: Medicaid Other | Attending: Obstetrics and Gynecology | Admitting: Obstetrics and Gynecology

## 2018-07-18 ENCOUNTER — Encounter (HOSPITAL_COMMUNITY): Payer: Self-pay

## 2018-07-18 ENCOUNTER — Other Ambulatory Visit: Payer: Self-pay

## 2018-07-18 DIAGNOSIS — Z880 Allergy status to penicillin: Secondary | ICD-10-CM | POA: Diagnosis not present

## 2018-07-18 DIAGNOSIS — O210 Mild hyperemesis gravidarum: Secondary | ICD-10-CM | POA: Diagnosis present

## 2018-07-18 DIAGNOSIS — Z3A10 10 weeks gestation of pregnancy: Secondary | ICD-10-CM | POA: Diagnosis not present

## 2018-07-18 DIAGNOSIS — R110 Nausea: Secondary | ICD-10-CM

## 2018-07-18 DIAGNOSIS — F1290 Cannabis use, unspecified, uncomplicated: Secondary | ICD-10-CM | POA: Diagnosis not present

## 2018-07-18 DIAGNOSIS — R112 Nausea with vomiting, unspecified: Secondary | ICD-10-CM

## 2018-07-18 DIAGNOSIS — O99321 Drug use complicating pregnancy, first trimester: Secondary | ICD-10-CM | POA: Diagnosis not present

## 2018-07-18 DIAGNOSIS — O219 Vomiting of pregnancy, unspecified: Secondary | ICD-10-CM

## 2018-07-18 LAB — COMPREHENSIVE METABOLIC PANEL
ALT: 22 U/L (ref 0–44)
AST: 23 U/L (ref 15–41)
Albumin: 3.7 g/dL (ref 3.5–5.0)
Alkaline Phosphatase: 36 U/L — ABNORMAL LOW (ref 38–126)
Anion gap: 11 (ref 5–15)
BUN: 5 mg/dL — ABNORMAL LOW (ref 6–20)
CO2: 22 mmol/L (ref 22–32)
Calcium: 9.3 mg/dL (ref 8.9–10.3)
Chloride: 104 mmol/L (ref 98–111)
Creatinine, Ser: 0.66 mg/dL (ref 0.44–1.00)
GFR calc Af Amer: >60 mL/min (ref >60)
GFR calc non Af Amer: >60 mL/min (ref >60)
Glucose, Bld: 72 mg/dL (ref 70–99)
Potassium: 3.6 mmol/L (ref 3.5–5.1)
Sodium: 137 mmol/L (ref 135–145)
Total Bilirubin: 1.3 mg/dL — ABNORMAL HIGH (ref 0.3–1.2)
Total Protein: 6.7 g/dL (ref 6.5–8.1)

## 2018-07-18 LAB — RAPID URINE DRUG SCREEN, HOSP PERFORMED
Amphetamines: NOT DETECTED
Barbiturates: NOT DETECTED
Benzodiazepines: NOT DETECTED
Cocaine: NOT DETECTED
Opiates: NOT DETECTED
Tetrahydrocannabinol: POSITIVE — AB

## 2018-07-18 LAB — URINALYSIS, ROUTINE W REFLEX MICROSCOPIC
Bilirubin Urine: NEGATIVE
Glucose, UA: NEGATIVE mg/dL
Ketones, ur: 80 mg/dL — AB
Leukocytes,Ua: NEGATIVE
Nitrite: NEGATIVE
Protein, ur: 100 mg/dL — AB
Specific Gravity, Urine: 1.027 (ref 1.005–1.030)
pH: 6 (ref 5.0–8.0)

## 2018-07-18 MED ORDER — SCOPOLAMINE 1 MG/3DAYS TD PT72
1.0000 | MEDICATED_PATCH | TRANSDERMAL | Status: DC
  Administered 2018-07-18: 1.5 mg via TRANSDERMAL
  Filled 2018-07-18: qty 1

## 2018-07-18 MED ORDER — FAMOTIDINE IN NACL 20-0.9 MG/50ML-% IV SOLN
20.0000 mg | Freq: Once | INTRAVENOUS | Status: AC
  Administered 2018-07-18: 17:00:00 20 mg via INTRAVENOUS
  Filled 2018-07-18: qty 50

## 2018-07-18 MED ORDER — FAMOTIDINE 40 MG PO TABS: 40.0000 mg | ORAL_TABLET | Freq: Every evening | ORAL | 1 refills | Status: DC

## 2018-07-18 MED ORDER — SCOPOLAMINE 1 MG/3DAYS TD PT72: 1.0000 | MEDICATED_PATCH | TRANSDERMAL | 1 refills | Status: AC

## 2018-07-18 MED ORDER — LACTATED RINGERS IV BOLUS
1000.0000 mL | Freq: Once | INTRAVENOUS | Status: AC
  Administered 2018-07-18: 1000 mL via INTRAVENOUS

## 2018-07-18 MED ORDER — SODIUM CHLORIDE 0.9 % IV SOLN
8.0000 mg | Freq: Once | INTRAVENOUS | Status: AC
  Administered 2018-07-18: 8 mg via INTRAVENOUS
  Filled 2018-07-18: qty 4

## 2018-07-18 MED ORDER — ONDANSETRON 8 MG PO TBDP: 8.0000 mg | ORAL_TABLET | Freq: Three times a day (TID) | ORAL | 0 refills | Status: AC | PRN

## 2018-07-18 NOTE — MAU Note (Signed)
[facility] NP states she obtained FHT with bedside u/s

## 2018-07-18 NOTE — MAU Provider Note (Signed)
History     CSN: [id number]  Arrival date and time: 07/18/18 1404   First Provider Initiated Contact with Patient 07/18/18 1537      Chief Complaint  Patient presents with  . Nausea  . Emesis  . Abdominal Pain   [name] is a 23 y.o. G2P0010 at 6765w6d who presents to MAU for nausea and vomiting. Pt reports she stopped being sick for a little while, but then the nausea and vomiting returned after she learned some disturbing news about her partner. Pt reports her weight was climbing, but now it is going back down. Pt reports abdominal pain only when vomiting. Pt actively vomiting during conversation with provider.  Pt weight on 06/26/2018 61.236kg. Weight today 58.2kg. 10% weight loss not observed.  Onset: 07/14/2018 Location: stomach Duration: 4days Character: constant nausea, vomits on average 4-5times per day, vomited x6 in past 24hrs (including episodes of emesis in MAU) Aggravating/Associated: stress, smells, greasy foods/none Relieving: sweet tea Treatment: phenergan - does not work, zofran - only thing that worked, but she stopped taking because of concern of birth defects, last took medication two weeks ago, but reports tried to take phenergan around 1300, but vomited it up immediately after taking  Pt denies VB, vaginal discharge/odor/itching. Pt denies abdominal pain, constipation, diarrhea, or urinary problems. Pt denies fever, chills, fatigue, sweating or changes in appetite. Pt denies SOB or chest pain. Pt denies dizziness, HA, light-headedness, weakness.  Problems this pregnancy include: pt has not yet been seen this pregnancy, was called by clinic to reschedule NOB. Allergies? PCN Current medications/supplements? PNVs "sometimes"   OB History    Gravida  2   Para  0   Term  0   Preterm  0   AB  1   Living  0     SAB  1   TAB  0   Ectopic  0   Multiple  0   Live Births  0           Past Medical History:  Diagnosis Date  .  Anemia   . Chlamydia 2018, 2019  . Gonorrhea 2018, 2020  . Infection    UTI  . Medical history non-contributory     Past Surgical History:  Procedure Laterality Date  . NO PAST SURGERIES      Family History  Problem Relation Age of Onset  . Hypertension Mother   . Hypertension Father     Social History   Tobacco Use  . Smoking status: Never Smoker  . Smokeless tobacco: Never Used  Substance Use Topics  . Alcohol use: No  . Drug use: Yes    Types: Marijuana    Comment: last was in May 2020    Allergies:  Allergies  Allergen Reactions  . Penicillins Hives    DID THE REACTION INVOLVE: Swelling of the face/tongue/throat, SOB, or low BP? Yes Sudden or severe rash/hives, skin peeling, or the inside of the mouth or nose? Yes Did it require medical treatment? No When did it last happen?2019 If all above answers are "NO", may proceed with cephalosporin use.    Medications Prior to Admission  Medication Sig Dispense Refill Last Dose  . ondansetron (ZOFRAN ODT) 4 MG disintegrating tablet Take 1 tablet (4 mg total) by mouth every 8 (eight) hours as needed for nausea or vomiting. 30 tablet 1   . pantoprazole (PROTONIX) 20 MG tablet Take 1 tablet (20 mg total) by mouth daily. 30 tablet 0   .  Prenatal Vit-Fe Fumarate-FA (PRENATAL MULTIVITAMIN) TABS tablet Take 1 tablet by mouth daily at 12 noon.     . prochlorperazine (COMPAZINE) 10 MG tablet Take 1 tablet (10 mg total) by mouth 2 (two) times daily as needed for nausea or vomiting. 20 tablet 0   . pyridOXINE (VITAMIN B-6) 50 MG tablet Take 1 tablet (50 mg total) by mouth every 12 (twelve) hours as needed. 60 tablet 0     Review of Systems  Constitutional: Negative for chills, diaphoresis, fatigue and fever.  Respiratory: Negative for shortness of breath.   Cardiovascular: Negative for chest pain.  Gastrointestinal: Positive for abdominal pain, nausea and vomiting. Negative for constipation and diarrhea.  Genitourinary:  Negative for dysuria, flank pain, frequency, pelvic pain, urgency, vaginal bleeding and vaginal discharge.  Neurological: Negative for dizziness, weakness, light-headedness and headaches.   Physical Exam   Blood pressure 125/77, pulse (!) 114, temperature 98.8 F (37.1 C), temperature source Oral, resp. rate 18, height 5\' 2"  (1.575 m), weight 58.2 kg, last menstrual period 03/12/2018, SpO2 97 %, unknown if currently breastfeeding.  Patient Vitals for the past 24 hrs:  BP Temp Temp src Pulse Resp SpO2 Height Weight  07/18/18 1500 125/77 98.8 F (37.1 C) Oral (!) 114 18 97 % - -  07/18/18 1447 - - - - - - 5\' 2"  (1.575 m) 58.2 kg   Physical Exam  Constitutional: She is oriented to person, place, and time. She appears well-developed and well-nourished. No distress.  HENT:  Head: Normocephalic and atraumatic.  Respiratory: Effort normal.  GI: Soft. She exhibits no distension and no mass. There is no abdominal tenderness. There is no rebound and no guarding.  Neurological: She is alert and oriented to person, place, and time.  Skin: Skin is warm and dry. She is not diaphoretic.  Psychiatric: She has a normal mood and affect. Her behavior is normal. Judgment and thought content normal.   Results for orders placed or performed during the hospital encounter of 07/18/18 (from the past 24 hour(s))  Urine rapid drug screen (hosp performed)     Status: Abnormal   Collection Time: 07/18/18  3:43 PM  Result Value Ref Range   Opiates NONE DETECTED NONE DETECTED   Cocaine NONE DETECTED NONE DETECTED   Benzodiazepines NONE DETECTED NONE DETECTED   Amphetamines NONE DETECTED NONE DETECTED   Tetrahydrocannabinol POSITIVE (A) NONE DETECTED   Barbiturates NONE DETECTED NONE DETECTED  Urinalysis, Routine w reflex microscopic     Status: Abnormal   Collection Time: 07/18/18  3:54 PM  Result Value Ref Range   Color, Urine AMBER (A) YELLOW   APPearance HAZY (A) CLEAR   Specific Gravity, Urine 1.027  1.005 - 1.030   pH 6.0 5.0 - 8.0   Glucose, UA NEGATIVE NEGATIVE mg/dL   Hgb urine dipstick SMALL (A) NEGATIVE   Bilirubin Urine NEGATIVE NEGATIVE   Ketones, ur 80 (A) NEGATIVE mg/dL   Protein, ur 161100 (A) NEGATIVE mg/dL   Nitrite NEGATIVE NEGATIVE   Leukocytes,Ua NEGATIVE NEGATIVE   RBC / HPF 0-5 0 - 5 RBC/hpf   WBC, UA 0-5 0 - 5 WBC/hpf   Bacteria, UA RARE (A) NONE SEEN   Squamous Epithelial / LPF 0-5 0 - 5   Mucus PRESENT   Comprehensive metabolic panel     Status: Abnormal   Collection Time: 07/18/18  4:45 PM  Result Value Ref Range   Sodium 137 135 - 145 mmol/L   Potassium 3.6 3.5 - 5.1 mmol/L  Chloride 104 98 - 111 mmol/L   CO2 22 22 - 32 mmol/L   Glucose, Bld 72 70 - 99 mg/dL   BUN 5 (L) 6 - 20 mg/dL   Creatinine, Ser 0.66 0.44 - 1.00 mg/dL   Calcium 9.3 8.9 - 10.3 mg/dL   Total Protein 6.7 6.5 - 8.1 g/dL   Albumin 3.7 3.5 - 5.0 g/dL   AST 23 15 - 41 U/L   ALT 22 0 - 44 U/L   Alkaline Phosphatase 36 (L) 38 - 126 U/L   Total Bilirubin 1.3 (H) 0.3 - 1.2 mg/dL   GFR calc non Af Amer >60 >60 mL/min   GFR calc Af Amer >60 >60 mL/min   Anion gap 11 5 - 15   No results found.  MAU Course  Procedures  MDM -N/V of pregnancy -discussed benefits/risks of Zofran, pt agreeable to Zofran use today and to continue use at home -1L LR + Zofran 8mg  + Pepcid 20mg  -UA: amber/hazy/sm hgb/80ketones/100PRO/rare bacteria, sending urine for culture -UDS: +THC -CMP: no abnormalities requiring treatment -PO challenge successful -pt discharged to home in stable condition  Orders Placed This Encounter  Procedures  . Culture, OB Urine    Standing Status:   Standing    Number of Occurrences:   1  . Urinalysis, Routine w reflex microscopic    Standing Status:   Standing    Number of Occurrences:   1  . Urine rapid drug screen (hosp performed)    Standing Status:   Standing    Number of Occurrences:   1  . Comprehensive metabolic panel    Standing Status:   Standing    Number of  Occurrences:   1  . Insert peripheral IV    Standing Status:   Standing    Number of Occurrences:   1  . Discharge patient    Order Specific Question:   Discharge disposition    Answer:   01-Home or Self Care [1]    Order Specific Question:   Discharge patient date    Answer:   07/18/2018   Meds ordered this encounter  Medications  . lactated ringers bolus 1,000 mL  . ondansetron (ZOFRAN) 8 mg in sodium chloride 0.9 % 50 mL IVPB  . famotidine (PEPCID) IVPB 20 mg premix  . scopolamine (TRANSDERM-SCOP) 1 MG/3DAYS 1.5 mg  . famotidine (PEPCID) 40 MG tablet    Sig: Take 1 tablet (40 mg total) by mouth every evening.    Dispense:  30 tablet    Refill:  1    Order Specific Question:   Supervising Provider    Answer:   Sloan Leiter [[id number]]  . ondansetron (ZOFRAN ODT) 8 MG disintegrating tablet    Sig: Take 1 tablet (8 mg total) by mouth every 8 (eight) hours as needed for up to 7 days for nausea or vomiting.    Dispense:  21 tablet    Refill:  0    Order Specific Question:   Supervising Provider    Answer:   Sloan Leiter [[id number]]  . scopolamine (TRANSDERM-SCOP, 1.5 MG,) 1 MG/3DAYS    Sig: Place 1 patch (1.5 mg total) onto the skin every 3 (three) days for 8 doses.    Dispense:  4 patch    Refill:  1    Order Specific Question:   Supervising Provider    Answer:   Sloan Leiter [id number]   Assessment and Plan   1. Non-intractable vomiting with nausea,  unspecified vomiting type   2. [redacted] weeks gestation of pregnancy   3. Marijuana use    Allergies as of 07/18/2018      Reactions   Penicillins Hives   DID THE REACTION INVOLVE: Swelling of the face/tongue/throat, SOB, or low BP? Yes Sudden or severe rash/hives, skin peeling, or the inside of the mouth or nose? Yes Did it require medical treatment? No When did it last happen?2019 If all above answers are "NO", may proceed with cephalosporin use.      Medication List    STOP taking these medications   pantoprazole 20  MG tablet Commonly known as: Protonix   prochlorperazine 10 MG tablet Commonly known as: COMPAZINE   pyridOXINE 50 MG tablet Commonly known as: VITAMIN B-6     TAKE these medications   famotidine 40 MG tablet Commonly known as: PEPCID Take 1 tablet (40 mg total) by mouth every evening.   ondansetron 8 MG disintegrating tablet Commonly known as: Zofran ODT Take 1 tablet (8 mg total) by mouth every 8 (eight) hours as needed for up to 7 days for nausea or vomiting. What changed:   medication strength  how much to take   prenatal multivitamin Tabs tablet Take 1 tablet by mouth daily at 12 noon.   scopolamine 1 MG/3DAYS Commonly known as: Transderm-Scop (1.5 MG) Place 1 patch (1.5 mg total) onto the skin every 3 (three) days for 8 doses.      -pharmacologic and nonpharmacologic solutions to N/V in pregnancy discussed; pt advised to take N/V medications around the clock and to discuss discontinuation at first OB visit -pt advised to discontinue marijuana use, discussed link between marijuana use and N/V -RX for Zofran -RX for Scopolamine -RX for Pepcid -discussed reasonable expectations for N/V in pregnancy -strict hyperemesis/return MAU precautions discussed -message sent to [facility] to reschedule NOB, list of OB providers given at patient request -pt discharged to home in stable condition  Christy [name] 07/18/2018, 6:03 PM

## 2018-07-18 NOTE — MAU Note (Signed)
Christy [name] is a 23 y.o. at [redacted]w[redacted]d here in MAU reporting: nausea and vomiting since July 3, emesis x 6 in the last 24 hours. No diarrhea. Having abdominal pain, no bleeding  Onset of complaint: ongoing  Pain score: 10/10  Vitals:   07/18/18 1500  BP: 125/77  Pulse: (!) 114  Resp: 18  Temp: 98.8 F (37.1 C)  SpO2: 97%     FHT: doppler attempted, unable to obtain FHT  Lab orders placed from triage: UA

## 2018-07-18 NOTE — Discharge Instructions (Signed)
[facility]     Phone: [phone]  [facility] [employer] [facility] at [place]  Phone: [phone]  [facility] [employer] [facility] [place]  Phone: [place] [facility] [employer] [facility] at [place]                           Phone: [place] for [place] [facility]          Phone: [phone]  [facility]    Phone: [phone]   [facility])    Phone: [facility]    Phone: [phone]  [facility]    Phone: [place]    Phone: [phone]  [place] [facility]  Phone: [place]               Phone: [phone]  [facility]   Phone: [phone]  [facility]    Phone: [phone]                  Morning Sickness  Morning sickness is when a woman feels nauseous during pregnancy. This nauseous feeling may or may not come with vomiting. It often occurs in the morning, but it can be a problem at any time of day. Morning sickness is most common during the first trimester. In some cases, it may continue throughout pregnancy. Although morning sickness is unpleasant, it is usually harmless unless the woman develops severe and continual vomiting (hyperemesis gravidarum), a condition that requires more intense treatment. What are the causes? The exact cause of this condition is not known, but it seems to be related to normal hormonal changes that occur in pregnancy. What increases the risk? You are more likely to develop this condition if:  You experienced nausea or vomiting before your pregnancy.  You had morning sickness during a previous pregnancy.  You are pregnant with more than one baby, such as twins. What are the signs or  symptoms? Symptoms of this condition include:  Nausea.  Vomiting. How is this diagnosed? This condition is usually diagnosed based on your signs and symptoms. How is this treated? In many cases, treatment is not needed for this condition. Making some changes to what you eat may help to control symptoms. Your health care provider may also prescribe or recommend:  Vitamin B6 supplements.  Anti-nausea medicines.  Ginger. Follow these instructions at home: Medicines  Take over-the-counter and prescription medicines only as told by your health care provider. Do not use any prescription, over-the-counter, or herbal medicines for morning sickness without first talking with your health care provider.  Taking multivitamins before getting pregnant can prevent or decrease the severity of morning sickness in most women. Eating and drinking  Eat a piece of dry toast or crackers before getting out of bed in the morning.  Eat 5 or 6 small meals a day.  Eat dry and bland foods, such as rice or a baked potato. Foods that are high in carbohydrates are often helpful.  Avoid greasy, fatty, and spicy foods.  Have someone cook for you if the smell of any food causes nausea and vomiting.  If you feel nauseous after taking prenatal vitamins, take the vitamins at night or with a snack.  Snack on protein foods between meals if you are hungry. Nuts,  yogurt, and cheese are good options.  Drink fluids throughout the day.  Try ginger ale made with real ginger, ginger tea made from fresh grated ginger, or ginger candies. General instructions  Do not use any products that contain nicotine or tobacco, such as cigarettes and e-cigarettes. If you need help quitting, ask your health care provider.  Get an air purifier to keep the air in your house free of odors.  Get plenty of fresh air.  Try to avoid odors that trigger your nausea.  Consider trying these methods to help relieve symptoms: ? Wearing  an acupressure wristband. These wristbands are often worn for seasickness. ? Acupuncture. Contact a health care provider if:  Your home remedies are not working and you need medicine.  You feel dizzy or light-headed.  You are losing weight. Get help right away if:  You have persistent and uncontrolled nausea and vomiting.  You faint.  You have severe pain in your abdomen. Summary  Morning sickness is when a woman feels nauseous during pregnancy. This nauseous feeling may or may not come with vomiting.  Morning sickness is most common during the first trimester.  It often occurs in the morning, but it can be a problem at any time of day.  In many cases, treatment is not needed for this condition. Making some changes to what you eat may help to control symptoms. This information is not intended to replace advice given to you by your health care provider. Make sure you discuss any questions you have with your health care provider. Document Released: 02/18/2006 Document Revised: 12/10/2016 Document Reviewed: 01/31/2016 Elsevier Patient Education  2020 [employer]

## 2018-07-20 LAB — CULTURE, OB URINE: Culture: NO GROWTH

## 2018-09-08 LAB — OB RESULTS CONSOLE HIV ANTIBODY (ROUTINE TESTING): HIV: NONREACTIVE

## 2018-09-08 LAB — OB RESULTS CONSOLE GC/CHLAMYDIA
Chlamydia: POSITIVE
Gonorrhea: NEGATIVE

## 2018-09-08 LAB — OB RESULTS CONSOLE RPR: RPR: NONREACTIVE

## 2018-09-08 LAB — OB RESULTS CONSOLE RUBELLA ANTIBODY, IGM: Rubella: IMMUNE

## 2018-09-08 LAB — OB RESULTS CONSOLE HEPATITIS B SURFACE ANTIGEN: Hepatitis B Surface Ag: NEGATIVE

## 2018-11-29 ENCOUNTER — Other Ambulatory Visit: Payer: Self-pay | Admitting: Family Medicine

## 2018-11-29 ENCOUNTER — Ambulatory Visit (INDEPENDENT_AMBULATORY_CARE_PROVIDER_SITE_OTHER): Payer: Medicaid Other | Admitting: Family Medicine

## 2018-11-29 ENCOUNTER — Other Ambulatory Visit: Payer: Medicaid Other

## 2018-11-29 ENCOUNTER — Other Ambulatory Visit (HOSPITAL_COMMUNITY)
Admission: RE | Admit: 2018-11-29 | Discharge: 2018-11-29 | Disposition: A | Discharge status: Home / Self Care | Payer: Medicaid Other | Source: Ambulatory Visit | Attending: Family Medicine | Admitting: Family Medicine

## 2018-11-29 ENCOUNTER — Encounter: Payer: Self-pay | Admitting: Family Medicine

## 2018-11-29 ENCOUNTER — Other Ambulatory Visit: Payer: Self-pay

## 2018-11-29 VITALS — BP 119/75 | HR 100 | Wt 148.0 lb

## 2018-11-29 DIAGNOSIS — R12 Heartburn: Secondary | ICD-10-CM

## 2018-11-29 DIAGNOSIS — Z3A30 30 weeks gestation of pregnancy: Secondary | ICD-10-CM

## 2018-11-29 DIAGNOSIS — Z23 Encounter for immunization: Secondary | ICD-10-CM

## 2018-11-29 DIAGNOSIS — O26899 Other specified pregnancy related conditions, unspecified trimester: Secondary | ICD-10-CM

## 2018-11-29 DIAGNOSIS — Z3480 Encounter for supervision of other normal pregnancy, unspecified trimester: Secondary | ICD-10-CM | POA: Diagnosis present

## 2018-11-29 DIAGNOSIS — O219 Vomiting of pregnancy, unspecified: Secondary | ICD-10-CM

## 2018-11-29 DIAGNOSIS — O09899 Supervision of other high risk pregnancies, unspecified trimester: Secondary | ICD-10-CM

## 2018-11-29 DIAGNOSIS — B3731 Acute candidiasis of vulva and vagina: Secondary | ICD-10-CM

## 2018-11-29 DIAGNOSIS — Z8619 Personal history of other infectious and parasitic diseases: Secondary | ICD-10-CM

## 2018-11-29 DIAGNOSIS — B373 Candidiasis of vulva and vagina: Secondary | ICD-10-CM

## 2018-11-29 DIAGNOSIS — O360130 Maternal care for anti-D [Rh] antibodies, third trimester, not applicable or unspecified: Secondary | ICD-10-CM | POA: Diagnosis not present

## 2018-11-29 DIAGNOSIS — Z8759 Personal history of other complications of pregnancy, childbirth and the puerperium: Secondary | ICD-10-CM

## 2018-11-29 DIAGNOSIS — O26893 Other specified pregnancy related conditions, third trimester: Secondary | ICD-10-CM

## 2018-11-29 DIAGNOSIS — Z6791 Unspecified blood type, Rh negative: Secondary | ICD-10-CM

## 2018-11-29 DIAGNOSIS — O09299 Supervision of pregnancy with other poor reproductive or obstetric history, unspecified trimester: Secondary | ICD-10-CM | POA: Insufficient documentation

## 2018-11-29 DIAGNOSIS — O09293 Supervision of pregnancy with other poor reproductive or obstetric history, third trimester: Secondary | ICD-10-CM | POA: Diagnosis not present

## 2018-11-29 HISTORY — DX: Supervision of other high risk pregnancies, unspecified trimester: O09.899

## 2018-11-29 HISTORY — DX: Supervision of pregnancy with other poor reproductive or obstetric history, unspecified trimester: O09.299

## 2018-11-29 HISTORY — DX: Other specified pregnancy related conditions, unspecified trimester: O26.899

## 2018-11-29 HISTORY — DX: Heartburn: R12

## 2018-11-29 HISTORY — DX: Encounter for supervision of other normal pregnancy, unspecified trimester: Z34.80

## 2018-11-29 HISTORY — DX: Vomiting of pregnancy, unspecified: O21.9

## 2018-11-29 MED ORDER — RANITIDINE HCL 75 MG PO TABS: 75.0000 mg | ORAL_TABLET | Freq: Two times a day (BID) | ORAL | 0 refills | Status: DC

## 2018-11-29 MED ORDER — RHO D IMMUNE GLOBULIN 1500 UNIT/2ML IJ SOSY
300.0000 ug | PREFILLED_SYRINGE | Freq: Once | INTRAMUSCULAR | Status: AC
  Administered 2018-11-29: 300 ug via INTRAMUSCULAR

## 2018-11-29 NOTE — Addendum Note (Signed)
Addended by: Lewie Loron D on: 11/29/2018 11:05 AM   Modules accepted: Orders

## 2018-11-29 NOTE — Addendum Note (Signed)
Addended by: Barrington Ellison on: 11/29/2018 10:44 AM   Modules accepted: Orders

## 2018-11-29 NOTE — Progress Notes (Addendum)
History:   Christy [name] is a 23 y.o. G2P0010 at [redacted]w[redacted]d by LMP being seen today for her first obstetrical visit. She is transferring from [facility].  Her obstetrical history is significant for positive chlamydia in August 2020 for which she was treated. Patient does intend to breast feed. Pregnancy history fully reviewed.  Patient reports heartburn symptoms and continued nausea. She occasionally takes Zofran. She is able to tolerate PO well; about 1 day a week she will not eat much due to nausea. Normal fetal movement. Denies ctx, vaginal bleeding, LOF.      HISTORY: OB History  Gravida Para Term Preterm AB Living  2 0 0 0 1 0  SAB TAB Ectopic Multiple Live Births  1 0 0 0 0    # Outcome Date GA Lbr Len/2nd Weight Sex Delivery Anes PTL Lv  2 Current           1 SAB 04/2018            Last pap smear: Never; collected today   Past Medical History:  Diagnosis Date  . Anemia   . Chlamydia 2018, 2019  . Gonorrhea 2018, 2020  . Infection    UTI  . Medical history non-contributory    Past Surgical History:  Procedure Laterality Date  . NO PAST SURGERIES     Family History  Problem Relation Age of Onset  . Hypertension Mother   . Hypertension Father   . Hypertension Maternal Grandmother    Social History   Tobacco Use  . Smoking status: Never Smoker  . Smokeless tobacco: Never Used  Substance Use Topics  . Alcohol use: No  . Drug use: Yes    Types: Marijuana    Comment: last smoked 2 days ago, est 3 x weekly   Allergies  Allergen Reactions  . Penicillins Hives    DID THE REACTION INVOLVE: Swelling of the face/tongue/throat, SOB, or low BP? Yes Sudden or severe rash/hives, skin peeling, or the inside of the mouth or nose? Yes Did it require medical treatment? No When did it last happen?2019 If all above answers are "NO", may proceed with cephalosporin use.   Current Outpatient Medications on File Prior to Visit  Medication Sig Dispense  Refill  . ondansetron (ZOFRAN) 8 MG tablet Take by mouth every 8 (eight) hours as needed for nausea or vomiting.    . Prenatal Vit-Fe Fumarate-FA (PRENATAL MULTIVITAMIN) TABS tablet Take 1 tablet by mouth daily at 12 noon.    . famotidine (PEPCID) 40 MG tablet Take 1 tablet (40 mg total) by mouth every evening. (Patient not taking: Reported on 11/29/2018) 30 tablet 1   No current facility-administered medications on file prior to visit.     Review of Systems Pertinent items noted in HPI and remainder of comprehensive ROS otherwise negative. Physical Exam:   Vitals:   11/29/18 0845  BP: 119/75  Pulse: 100  Weight: 148 lb (67.1 kg)   Fetal Heart Rate (bpm): 150  Fetal Status: positive fetal movement   General:  Alert, oriented and cooperative. Patient is in no acute distress.  Skin: Skin is warm and dry. No rash noted.   Cardiovascular: Normal heart rate noted  Respiratory: Normal respiratory effort, no problems with respiration noted  Abdomen: Soft, gravid, appropriate for gestational age.        Pelvic: Cervical exam deferred. Normal external vaginal mucosa. Thick white discharge noted. Normal cervix.      Extremities: Normal range of  motion.     Mental Status: Normal mood and affect. Normal behavior. Normal judgment and thought content.      Assessment:    Pregnancy: G2P0010 Patient Active Problem List   Diagnosis Date Noted  . Supervision of other normal pregnancy, antepartum 11/29/2018  . History of maternal chlamydia infection, currently pregnant 11/29/2018  . Nausea and vomiting in pregnancy 11/29/2018  . Heartburn in pregnancy 11/29/2018     Plan:  Merari was seen today for initial prenatal visit.  Diagnoses and all orders for this visit:  Supervision of other normal pregnancy, antepartum -     Cytology - PAP( [place]) -     Cervicovaginal ancillary only( [place]) -     Flu Vaccine QUAD 36+ mos IM (Fluarix, Quad PF) -     Tdap vaccine greater than or  equal to 7yo IM -     Panorama - RTC in 2 weeks; virtual okay; owns BP cuff - Last [place] on 10/29: U/S @ 26+weeks, + activity and FHT's= 164, posterior grade 1 high  placenta, cx is long and closed= 3.6cm, fluid is WNL, measurements c/w  dates, needs f/u scan to complete anatomy, no abn seen in this female  fetus/mc : F/u Scan ordered to complete anatomy as recommended   Rh negative state in antepartum period -     rho (d) immune globulin (RHIG/RHOPHYLAC) injection 300 mcg  Nausea and vomiting in pregnancy       - Cont Zofran as needed.  Maternal chlamydia infection, history of, currently pregnant       - > 4 weeks since treatment; TOC today; oral swab also done per patient request   Heartburn during pregnancy in third trimester -     ranitidine (ZANTAC 75) 75 MG tablet; Take 1 tablet (75 mg total) by mouth 2 (two) times daily.  Other orders -     Glucose Tolerance, 2 Hours w/1 Hour (patient vomited after oral swab therefore only fasting and 1 hour accurately obtained; if abnormal will have patient repeat; if 1 hour WNL this is sufficient for testing)  -     CBC -     HIV antibody -     RPR  Continue prenatal vitamins. Genetic Screening discussed, Panorama ordered: patient reports no genetic testing prior Problem list reviewed and updated. The nature of [place] with multiple MDs and other Advanced Practice Providers was explained to patient; also emphasized that residents, students are part of our team. Routine obstetric precautions reviewed. No follow-ups on file.    Barrington Ellison, MD [facility] Family Medicine Fellow, [facility] for [employer], [place]

## 2018-11-29 NOTE — Patient Instructions (Signed)
Heartburn During Pregnancy ° °Heartburn is pain or discomfort in the throat or chest. It may cause a burning feeling. It happens when stomach acid moves up into the tube that carries food from your mouth to your stomach (esophagus). Heartburn is common during pregnancy. It usually goes away or gets better after giving birth. °Follow these instructions at home: °Eating and drinking °· Do not drink alcohol while you are pregnant. °· Figure out which foods and beverages make you feel worse, and avoid them. °· Beverages that you may want to avoid include: °? Coffee and tea (with or without caffeine). °? Energy drinks and sports drinks. °? Bubbly (carbonated) drinks or sodas. °? Citrus fruit juices. °· Foods that you may want to avoid include: °? Chocolate and cocoa. °? Peppermint and mint flavorings. °? Garlic, onions, and horseradish. °? Spicy and acidic foods. These include peppers, chili powder, curry powder, vinegar, hot sauces, and barbecue sauce. °? Citrus fruits, such as oranges, lemons, and limes. °? Tomato-based foods, such as red sauce, chili, and salsa. °? Fried and fatty foods, such as donuts, french fries, potato chips, and high-fat dressings. °? High-fat meats, such as hot dogs, cold cuts, sausage, ham, and bacon. °? High-fat dairy items, such as whole milk, butter, and cheese. °· Eat small meals often, instead of large meals. °· Avoid drinking a lot of liquid with your meals. °· Avoid eating meals during the 2-3 hours before you go to bed. °· Avoid lying down right after you eat. °· Do not exercise right after you eat. °Medicines °· Take over-the-counter and prescription medicines only as told by your doctor. °· Do not take aspirin, ibuprofen, or other NSAIDs unless your doctor tells you to do that. °· Your doctor may tell you to avoid medicines that have sodium bicarbonate in them. °General instructions ° °· If told, raise the head of your bed about 6 inches (15 cm). You can do this by putting blocks  under the legs. Sleeping with more pillows does not help with heartburn. °· Do not use any products that contain nicotine or tobacco, such as cigarettes and e-cigarettes. If you need help quitting, ask your doctor. °· Wear loose-fitting clothing. °· Try to lower your stress, such as with yoga or meditation. If you need help, ask your doctor. °· Stay at a healthy weight. If you are overweight, work with your doctor to safely lose weight. °· Keep all follow-up visits as told by your doctor. This is important. °Contact a doctor if: °· You get new symptoms. °· Your symptoms do not get better with treatment. °· You have weight loss and you do not know why. °· You have trouble swallowing. °· You make loud sounds when you breathe (wheeze). °· You have a cough that does not go away. °· You have heartburn often for more than 2 weeks. °· You feel sick to your stomach (nauseous), and this does not get better with treatment. °· You are throwing up (vomiting), and this does not get better with treatment. °· You have pain in your belly (abdomen). °Get help right away if: °· You have very bad chest pain that spreads to your arm, neck, or jaw. °· You feel sweaty, dizzy, or light-headed. °· You have trouble breathing. °· You have pain when swallowing. °· You throw up and your throw-up looks like blood or coffee grounds. °· Your poop (stool) is bloody or black. °This information is not intended to replace advice given to you by your health   care provider. Make sure you discuss any questions you have with your health care provider. °Document Released: 01/30/2010 Document Revised: 04/20/2018 Document Reviewed: 09/15/2015 °Elsevier Patient Education © 2020 Elsevier Inc. ° ° °

## 2018-11-30 ENCOUNTER — Other Ambulatory Visit: Payer: Self-pay

## 2018-11-30 DIAGNOSIS — O24419 Gestational diabetes mellitus in pregnancy, unspecified control: Secondary | ICD-10-CM

## 2018-11-30 LAB — GLUCOSE TOLERANCE, 2 HOURS W/ 1HR
Glucose, 1 hour: 184 mg/dL — ABNORMAL HIGH (ref 65–179)
Glucose, 2 hour: 68 mg/dL (ref 65–152)
Glucose, Fasting: 77 mg/dL (ref 65–91)

## 2018-11-30 LAB — CBC
Hematocrit: 33.4 % — ABNORMAL LOW (ref 34.0–46.6)
Hemoglobin: 11.5 g/dL (ref 11.1–15.9)
MCH: 32.5 pg (ref 26.6–33.0)
MCHC: 34.4 g/dL (ref 31.5–35.7)
MCV: 94 fL (ref 79–97)
Platelets: 253 10*3/uL (ref 150–450)
RBC: 3.54 x10E6/uL — ABNORMAL LOW (ref 3.77–5.28)
RDW: 11.9 % (ref 11.7–15.4)
WBC: 10.3 10*3/uL (ref 3.4–10.8)

## 2018-11-30 LAB — RPR: RPR Ser Ql: NONREACTIVE

## 2018-11-30 LAB — CERVICOVAGINAL ANCILLARY ONLY
Chlamydia: NEGATIVE
Chlamydia: NEGATIVE
Comment: NEGATIVE
Comment: NORMAL
Comment: NEGATIVE
Comment: NORMAL
Neisseria Gonorrhea: NEGATIVE
Neisseria Gonorrhea: NEGATIVE

## 2018-11-30 LAB — HIV ANTIBODY (ROUTINE TESTING W REFLEX): HIV Screen 4th Generation wRfx: NONREACTIVE

## 2018-12-01 LAB — CYTOLOGY - PAP: Diagnosis: NEGATIVE

## 2018-12-01 MED ORDER — TERCONAZOLE 0.8 % VA CREA: 1.0000 | TOPICAL_CREAM | Freq: Every day | VAGINAL | 0 refills | Status: DC

## 2018-12-01 NOTE — Addendum Note (Signed)
Addended by: Barrington Ellison on: 12/01/2018 01:29 PM   Modules accepted: Orders

## 2018-12-11 ENCOUNTER — Encounter: Payer: Self-pay | Admitting: Obstetrics and Gynecology

## 2018-12-13 ENCOUNTER — Other Ambulatory Visit: Payer: Self-pay | Admitting: Obstetrics and Gynecology

## 2018-12-13 ENCOUNTER — Telehealth: Payer: Medicaid Other | Admitting: Nurse Practitioner

## 2018-12-13 MED ORDER — ACCU-CHEK GUIDE W/DEVICE KIT: 1.0000 | PACK | Freq: Four times a day (QID) | 0 refills | Status: AC

## 2018-12-13 MED ORDER — ACCU-CHEK FASTCLIX LANCETS MISC: 1.0000 | Freq: Four times a day (QID) | 3 refills | Status: AC

## 2018-12-13 MED ORDER — ACCU-CHEK GUIDE VI STRP: ORAL_STRIP | 12 refills | Status: DC

## 2018-12-14 ENCOUNTER — Ambulatory Visit (HOSPITAL_COMMUNITY)
Admission: RE | Admit: 2018-12-14 | Discharge: 2018-12-14 | Disposition: A | Discharge status: Home / Self Care | Payer: Medicaid Other | Source: Ambulatory Visit | Attending: Obstetrics and Gynecology | Admitting: Obstetrics and Gynecology

## 2018-12-14 ENCOUNTER — Other Ambulatory Visit: Payer: Self-pay

## 2018-12-14 ENCOUNTER — Other Ambulatory Visit (HOSPITAL_COMMUNITY): Payer: Medicaid Other

## 2018-12-14 DIAGNOSIS — Z363 Encounter for antenatal screening for malformations: Secondary | ICD-10-CM

## 2018-12-14 DIAGNOSIS — Z3480 Encounter for supervision of other normal pregnancy, unspecified trimester: Secondary | ICD-10-CM | POA: Diagnosis present

## 2018-12-14 DIAGNOSIS — Z3A32 32 weeks gestation of pregnancy: Secondary | ICD-10-CM | POA: Diagnosis not present

## 2018-12-14 DIAGNOSIS — O24410 Gestational diabetes mellitus in pregnancy, diet controlled: Secondary | ICD-10-CM

## 2018-12-15 ENCOUNTER — Other Ambulatory Visit (HOSPITAL_COMMUNITY): Payer: Self-pay | Admitting: *Deleted

## 2018-12-15 ENCOUNTER — Encounter: Payer: Self-pay | Admitting: Obstetrics and Gynecology

## 2018-12-15 DIAGNOSIS — O0933 Supervision of pregnancy with insufficient antenatal care, third trimester: Secondary | ICD-10-CM

## 2018-12-18 ENCOUNTER — Telehealth: Payer: Self-pay

## 2018-12-18 NOTE — Telephone Encounter (Signed)
Pt called and reports that she has been vomiting the last 24 hours and is unable to keep food or fluids down. Pt reports zofran is not helping. Pt reports cramping and good fetal movement. I advised pt to go be seen at the hospital for evaluation. Pt verbalizes understanding.

## 2018-12-26 ENCOUNTER — Encounter: Payer: Self-pay | Admitting: Obstetrics and Gynecology

## 2018-12-26 ENCOUNTER — Telehealth: Payer: Self-pay | Admitting: *Deleted

## 2018-12-26 ENCOUNTER — Other Ambulatory Visit: Payer: Medicaid Other

## 2018-12-26 ENCOUNTER — Telehealth: Payer: Self-pay | Admitting: Nurse Practitioner

## 2018-12-26 NOTE — Telephone Encounter (Signed)
Karl called and left a voicemail she was sick a little this am and wanted to make sure she can still come to her appointment - doesn't want to get here and can't be seen. Josiel Gahm,RN

## 2019-01-09 ENCOUNTER — Ambulatory Visit (HOSPITAL_COMMUNITY): Payer: Medicaid Other | Admitting: *Deleted

## 2019-01-09 ENCOUNTER — Other Ambulatory Visit: Payer: Self-pay

## 2019-01-09 ENCOUNTER — Ambulatory Visit (HOSPITAL_COMMUNITY)
Admission: RE | Admit: 2019-01-09 | Discharge: 2019-01-09 | Disposition: A | Discharge status: Home / Self Care | Payer: Medicaid Other | Source: Ambulatory Visit | Attending: Obstetrics and Gynecology | Admitting: Obstetrics and Gynecology

## 2019-01-09 ENCOUNTER — Encounter (HOSPITAL_COMMUNITY): Payer: Self-pay | Admitting: *Deleted

## 2019-01-09 DIAGNOSIS — Z3480 Encounter for supervision of other normal pregnancy, unspecified trimester: Secondary | ICD-10-CM

## 2019-01-09 DIAGNOSIS — Z3A35 35 weeks gestation of pregnancy: Secondary | ICD-10-CM | POA: Diagnosis not present

## 2019-01-09 DIAGNOSIS — Z362 Encounter for other antenatal screening follow-up: Secondary | ICD-10-CM

## 2019-01-09 DIAGNOSIS — O24410 Gestational diabetes mellitus in pregnancy, diet controlled: Secondary | ICD-10-CM

## 2019-01-09 DIAGNOSIS — O0933 Supervision of pregnancy with insufficient antenatal care, third trimester: Secondary | ICD-10-CM | POA: Insufficient documentation

## 2019-01-11 ENCOUNTER — Encounter: Payer: Self-pay | Admitting: Obstetrics and Gynecology

## 2019-01-11 ENCOUNTER — Other Ambulatory Visit: Payer: Medicaid Other

## 2019-01-12 NOTE — L&D Delivery Note (Signed)
OB/GYN Faculty Practice Delivery Note  Christy [name] is a 24 y.o. G2P0010 s/p VD at [redacted]w[redacted]d. She was admitted for SOL/SROM.   ROM: 2h 50m with clear fluid GBS Status: --[name] (01/05 0431) Maximum Maternal Temperature: 99.2F  Labor Progress: . Initial SVE: 3.5/90/-2. Patient progressed without augmentation; did have AROM of forebag. Received epidural. She then progressed to complete.   Delivery Date/Time: 1/18 @ 0016 Delivery: Called to room and patient was complete and pushing. Head delivered in LOA position. Loose nuchal cord present and reduced. Shoulder and body delivered in usual fashion. Infant with spontaneous cry, placed on mother's abdomen, dried and stimulated. Cord clamped x 2 after 1-minute delay, and cut by MGM. Cord blood drawn. Placenta delivered spontaneously with gentle cord traction. Fundus firm with massage and Pitocin. Labia, perineum, vagina, and cervix inspected inspected with left labial and periurethral lacerations which were repaired with 3-0 Vicryl in a standard fashion.  Baby Weight: pending  Placenta: Sent to L&D Complications: None Lacerations: left labial and periurethral  EBL: 225 mL Analgesia: Epidural   Infant: APGAR (1 MIN): 8   APGAR (5 MINS): 9   APGAR (10 MINS):     [name], MD [facility] Family Medicine Fellow, [facility] for [facility], [facility] 01/29/2019, 12:47 AM

## 2019-01-16 ENCOUNTER — Inpatient Hospital Stay (HOSPITAL_COMMUNITY)
Admission: AD | Admit: 2019-01-16 | Discharge: 2019-01-16 | Disposition: A | Discharge status: Home / Self Care | Payer: Medicaid Other | Source: Ambulatory Visit | Attending: Obstetrics and Gynecology | Admitting: Obstetrics and Gynecology

## 2019-01-16 ENCOUNTER — Other Ambulatory Visit (HOSPITAL_COMMUNITY)
Admission: RE | Admit: 2019-01-16 | Discharge: 2019-01-16 | Disposition: A | Discharge status: Home / Self Care | Payer: Medicaid Other | Source: Ambulatory Visit | Attending: Obstetrics & Gynecology | Admitting: Obstetrics & Gynecology

## 2019-01-16 ENCOUNTER — Encounter (HOSPITAL_COMMUNITY): Payer: Self-pay | Admitting: Obstetrics and Gynecology

## 2019-01-16 ENCOUNTER — Ambulatory Visit (INDEPENDENT_AMBULATORY_CARE_PROVIDER_SITE_OTHER): Payer: Medicaid Other | Admitting: Obstetrics & Gynecology

## 2019-01-16 ENCOUNTER — Other Ambulatory Visit: Payer: Self-pay

## 2019-01-16 VITALS — BP 113/75 | HR 83 | Wt 155.0 lb

## 2019-01-16 DIAGNOSIS — Z3480 Encounter for supervision of other normal pregnancy, unspecified trimester: Secondary | ICD-10-CM

## 2019-01-16 DIAGNOSIS — Z3483 Encounter for supervision of other normal pregnancy, third trimester: Secondary | ICD-10-CM | POA: Diagnosis not present

## 2019-01-16 DIAGNOSIS — R109 Unspecified abdominal pain: Secondary | ICD-10-CM | POA: Diagnosis present

## 2019-01-16 DIAGNOSIS — O4693 Antepartum hemorrhage, unspecified, third trimester: Secondary | ICD-10-CM | POA: Diagnosis not present

## 2019-01-16 DIAGNOSIS — O24419 Gestational diabetes mellitus in pregnancy, unspecified control: Secondary | ICD-10-CM | POA: Insufficient documentation

## 2019-01-16 DIAGNOSIS — Z880 Allergy status to penicillin: Secondary | ICD-10-CM | POA: Diagnosis not present

## 2019-01-16 DIAGNOSIS — Z3A36 36 weeks gestation of pregnancy: Secondary | ICD-10-CM | POA: Diagnosis not present

## 2019-01-16 DIAGNOSIS — Z8249 Family history of ischemic heart disease and other diseases of the circulatory system: Secondary | ICD-10-CM | POA: Insufficient documentation

## 2019-01-16 DIAGNOSIS — O26893 Other specified pregnancy related conditions, third trimester: Secondary | ICD-10-CM | POA: Diagnosis not present

## 2019-01-16 HISTORY — DX: Gestational diabetes mellitus in pregnancy, unspecified control: O24.419

## 2019-01-16 LAB — URINALYSIS, ROUTINE W REFLEX MICROSCOPIC
Bacteria, UA: NONE SEEN
Bilirubin Urine: NEGATIVE
Glucose, UA: NEGATIVE mg/dL
Ketones, ur: NEGATIVE mg/dL
Nitrite: NEGATIVE
Protein, ur: NEGATIVE mg/dL
Specific Gravity, Urine: 1.024 (ref 1.005–1.030)
pH: 5 (ref 5.0–8.0)

## 2019-01-16 LAB — GLUCOSE, CAPILLARY: Glucose-Capillary: 94 mg/dL (ref 70–99)

## 2019-01-16 LAB — POCT FERN TEST: POCT Fern Test: NEGATIVE

## 2019-01-16 NOTE — Discharge Instructions (Signed)
Fetal Movement Counts Patient Name: ________________________________________________ Patient Due Date: ____________________ What is a fetal movement count?  A fetal movement count is the number of times that you feel your baby move during a certain amount of time. This may also be called a fetal kick count. A fetal movement count is recommended for every pregnant woman. You may be asked to start counting fetal movements as early as week 28 of your pregnancy. Pay attention to when your baby is most active. You may notice your baby's sleep and wake cycles. You may also notice things that make your baby move more. You should do a fetal movement count:  When your baby is normally most active.  At the same time each day. A good time to count movements is while you are resting, after having something to eat and drink. How do I count fetal movements? 1. Find a quiet, comfortable area. Sit, or lie down on your side. 2. Write down the date, the start time and stop time, and the number of movements that you felt between those two times. Take this information with you to your health care visits. 3. Write down your start time when you feel the first movement. 4. Count kicks, flutters, swishes, rolls, and jabs. You should feel at least 10 movements. 5. You may stop counting after you have felt 10 movements, or if you have been counting for 2 hours. Write down the stop time. 6. If you do not feel 10 movements in 2 hours, contact your health care provider for further instructions. Your health care provider may want to do additional tests to assess your baby's well-being. Contact a health care provider if:  You feel fewer than 10 movements in 2 hours.  Your baby is not moving like he or she usually does. Date: ____________ Start time: ____________ Stop time: ____________ Movements: ____________ Date: ____________ Start time: ____________ Stop time: ____________ Movements: ____________ Date: ____________  Start time: ____________ Stop time: ____________ Movements: ____________ Date: ____________ Start time: ____________ Stop time: ____________ Movements: ____________ Date: ____________ Start time: ____________ Stop time: ____________ Movements: ____________ Date: ____________ Start time: ____________ Stop time: ____________ Movements: ____________ Date: ____________ Start time: ____________ Stop time: ____________ Movements: ____________ Date: ____________ Start time: ____________ Stop time: ____________ Movements: ____________ Date: ____________ Start time: ____________ Stop time: ____________ Movements: ____________ This information is not intended to replace advice given to you by your health care provider. Make sure you discuss any questions you have with your health care provider. Document Revised: 08/17/2018 Document Reviewed: 08/17/2018 Elsevier Patient Education  2020 Elsevier Inc.     Warning Signs During Pregnancy A pregnancy lasts about 40 weeks, starting from the first day of your last period until the baby is born. Pregnancy is divided into three phases called trimesters.  The first trimester refers to week 1 through week 13 of pregnancy.  The second trimester is the start of week 14 through the end of week 27.  The third trimester is the start of week 28 until you deliver your baby. During each trimester of pregnancy, certain signs and symptoms may indicate a problem. Talk with your health care provider about your current health and any medical conditions you have. Make sure you know the symptoms that you should watch for and report. How does this affect me?  Warning signs in the first trimester While some changes during the first trimester may be uncomfortable, most do not represent a serious problem. Let your health care provider know if   the following warning signs in the first trimester:  You cannot eat or drink without vomiting, and this lasts for longer than a  day.  You have vaginal bleeding or spotting along with menstrual-like cramping.  You have diarrhea for longer than a day.  You have a fever or other signs of infection, such as: ? Pain or burning when you urinate. ? Foul smelling or thick or yellowish vaginal discharge. Warning signs in the second trimester As your baby grows and changes during the second trimester, there are additional signs and symptoms that may indicate a problem. These include:  Signs and symptoms of infection, including a fever.  Signs or symptoms of a miscarriage or preterm labor, such as regular contractions, menstrual-like cramping, or lower abdominal pain.  Bloody or watery vaginal discharge or obvious vaginal bleeding.  Feeling like your heart is pounding.  Having trouble breathing.  Nausea, vomiting, or diarrhea that lasts for longer than a day.  Craving non-food items, such as clay, chalk, or dirt. This may be a sign of a very treatable medical condition called pica. Later in your second trimester, watch for signs and symptoms of a serious medical condition called preeclampsia.These include:  Changes in your vision.  A severe headache that does not go away.  Nausea and vomiting. It is also important to notice if your baby stops moving or moves less than usual during this time. Warning signs in the third trimester As you approach the third trimester, your baby is growing and your body is preparing for the birth of your baby. In your third trimester, be sure to let your health care provider know if:  You have signs and symptoms of infection, including a fever.  You have vaginal bleeding.  You notice that your baby is moving less than usual or is not moving.  You have nausea, vomiting, or diarrhea that lasts for longer than a day.  You have a severe headache that does not go away.  You have vision changes, including seeing spots or having blurry or double vision.  You have increased swelling  in your hands or face. How does this affect my baby? Throughout your pregnancy, always report any of the warning signs of a problem to your health care provider. This can help prevent complications that may affect your baby, including:  Increased risk for premature birth.  Infection that may be transmitted to your baby.  Increased risk for stillbirth. Contact a health care provider if:  You have any of the warning signs of a problem for the current trimester of your pregnancy.  Any of the following apply to you during any trimester of pregnancy: ? You have strong emotions, such as sadness or anxiety, that interfere with work or personal relationships. ? You feel unsafe in your home and need help finding a safe place to live. ? You are using tobacco products, alcohol, or drugs and you need help to stop. Get help right away if: You have signs or symptoms of labor before 37 weeks of pregnancy. These include:  Contractions that are 5 minutes or less apart, or that increase in frequency, intensity, or length.  Sudden, sharp abdominal pain or low back pain.  Uncontrolled gush or trickle of fluid from your vagina. Summary  A pregnancy lasts about 40 weeks, starting from the first day of your last period until the baby is born. Pregnancy is divided into three phases called trimesters. Each trimester has warning signs to watch for.  Always report  any warning signs to your health care provider in order to prevent complications that may affect both you and your baby.  Talk with your health care provider about your current health and any medical conditions you have. Make sure you know the symptoms that you should watch for and report. This information is not intended to replace advice given to you by your health care provider. Make sure you discuss any questions you have with your health care provider. Document Revised: 04/18/2018 Document Reviewed: 10/14/2016 Elsevier Patient Education  2020  [employer].

## 2019-01-16 NOTE — Progress Notes (Signed)
ROB/GBS. Mucus plug came out today.  C/o pressure and pain.

## 2019-01-16 NOTE — Progress Notes (Signed)
   PRENATAL VISIT NOTE  Subjective:  Christy [name] is a 24 y.o. G2P0010 at [redacted]w[redacted]d being seen today for ongoing prenatal care.  She is currently monitored for the following issues for this low-risk pregnancy and has Supervision of other normal pregnancy, antepartum; History of maternal chlamydia infection, currently pregnant; Nausea and vomiting in pregnancy; and Heartburn in pregnancy on their problem list.  Patient reports bleeding, carpal tunnel symptoms, nausea and no bleeding.  Contractions: Irritability. Vag. Bleeding: Scant.  Movement: Present. Denies leaking of fluid.   The following portions of the patient's history were reviewed and updated as appropriate: allergies, current medications, past family history, past medical history, past social history, past surgical history and problem list.   Objective:   Vitals:   01/16/19 1525  BP: 113/75  Pulse: 83  Weight: 155 lb (70.3 kg)    Fetal Status: Fetal Heart Rate (bpm): 152   Movement: Present     General:  Alert, oriented and cooperative. Patient is in no acute distress.  Skin: Skin is warm and dry. No rash noted.   Cardiovascular: Normal heart rate noted  Respiratory: Normal respiratory effort, no problems with respiration noted  Abdomen: Soft, gravid, appropriate for gestational age.  Pain/Pressure: Present     Pelvic: Cervical exam deferred        Extremities: Normal range of motion.  Edema: None  Mental Status: Normal mood and affect. Normal behavior. Normal judgment and thought content.   Assessment and Plan:  Pregnancy: G2P0010 at [redacted]w[redacted]d 1. Supervision of other normal pregnancy, antepartum She requested no cervical exam  Term labor symptoms and general obstetric precautions including but not limited to vaginal bleeding, contractions, leaking of fluid and fetal movement were reviewed in detail with the patient. Please refer to After Visit Summary for other counseling recommendations.   Return in about 1 week  (around 01/23/2019) for virtual ok.  Future Appointments  Date Time Provider Department Center  01/23/2019  4:15 PM Constant, Gigi Gin, MD [facility] None    Scheryl Darter, MD

## 2019-01-16 NOTE — Patient Instructions (Signed)

## 2019-01-16 NOTE — MAU Provider Note (Signed)
Chief Complaint:  Abdominal Pain, Vaginal Bleeding, and Decreased Fetal Movement   First Provider Initiated Contact with Patient 01/16/19 0411     HPI: Christy [name] is a 24 y.o. G2P0010 at [redacted]w[redacted]d who presents to maternity admissions reporting vaginal bleeding. Noticed some pink spotting on toilet paper around 3 am this morning. When she went to the bathroom she saw blood in the toilet that looked like bloody urine. Reports some contractions that are not frequent; hasn't timed them. Some decreased movement this morning.  No recent intercourse. Denies dysuria.  Has not been checking her blood sugars at home but reports significantly decreasing her sugar intake.   Pregnancy Course: [facility]. Has only had once visit back in November. Diagnosed with GDM but hasn't kept appointment with diabetes educator & is not checking BS at home. RH negative - received rhogam at 30 wks.   Past Medical History:  Diagnosis Date  . Anemia   . Chlamydia 2018, 2019  . Gestational diabetes   . Gonorrhea 2018, 2020  . Infection    UTI   OB History  Gravida Para Term Preterm AB Living  2 0 0 0 1 0  SAB TAB Ectopic Multiple Live Births  1 0 0 0 0    # Outcome Date GA Lbr Len/2nd Weight Sex Delivery Anes PTL Lv  2 Current           1 SAB 04/2018           Past Surgical History:  Procedure Laterality Date  . NO PAST SURGERIES     Family History  Problem Relation Age of Onset  . Hypertension Mother   . Hypertension Father   . Hypertension Maternal Grandmother    Social History   Tobacco Use  . Smoking status: Never Smoker  . Smokeless tobacco: Never Used  Substance Use Topics  . Alcohol use: No  . Drug use: Not Currently    Types: Marijuana    Comment: last smoked 2 days ago, est 3 x weekly   Allergies  Allergen Reactions  . Penicillins Hives    DID THE REACTION INVOLVE: Swelling of the face/tongue/throat, SOB, or low BP? Yes Sudden or severe rash/hives, skin peeling, or the inside of the  mouth or nose? Yes Did it require medical treatment? No When did it last happen?2019 If all above answers are "NO", may proceed with cephalosporin use.   Medications Prior to Admission  Medication Sig Dispense Refill Last Dose  . famotidine (PEPCID) 40 MG tablet Take 1 tablet (40 mg total) by mouth every evening. (Patient not taking: Reported on 11/29/2018) 30 tablet 1   . glucose blood (ACCU-CHEK GUIDE) test strip Use as instructed 100 each 12   . ondansetron (ZOFRAN) 8 MG tablet Take by mouth every 8 (eight) hours as needed for nausea or vomiting.   Unknown at Unknown time  . Prenatal Vit-Fe Fumarate-FA (PRENATAL MULTIVITAMIN) TABS tablet Take 1 tablet by mouth daily at 12 noon.   More than a month at Unknown time  . raNITIdine HCl POWD TWICE A DAY 60 g 0 More than a month at Unknown time  . terconazole (TERAZOL 3) 0.8 % vaginal cream Place 1 applicator vaginally at bedtime. Apply nightly for three nights. 20 g 0     I have reviewed patient's Past Medical Hx, Surgical Hx, Family Hx, Social Hx, medications and allergies.   ROS:  Review of Systems  Constitutional: Negative.   Gastrointestinal: Positive for abdominal pain (occasional ctx).  Negative for constipation, diarrhea, nausea and vomiting.  Genitourinary: Positive for vaginal bleeding. Negative for dysuria and vaginal discharge.    Physical Exam   Patient Vitals for the past 24 hrs:  BP Temp Temp src Pulse Resp Weight  01/16/19 0355 119/72 97.9 F (36.6 C) Oral 97 16 69.1 kg    Constitutional: Well-developed, well-nourished female in no acute distress.  Cardiovascular: normal rate & rhythm, no murmur Respiratory: normal effort, lung sounds clear throughout GI: Abd soft, non-tender, gravid appropriate for gestational age. Pos BS x 4 MS: Extremities nontender, no edema, normal ROM Neurologic: Alert and oriented x 4.  GU:      Pelvic: small amount of pink mucoid discharge.   Dilation: Fingertip Effacement (%):  50 Cervical Position: Posterior Station: -3 Exam by:: Judeth Horn NP  NST:  Baseline: 150 bpm, Variability: Good {> 6 bpm), Accelerations: Reactive and Decelerations: Absent   Labs: Results for orders placed or performed during the hospital encounter of 01/16/19 (from the past 24 hour(s))  Glucose, capillary     Status: None   Collection Time: 01/16/19  4:27 AM  Result Value Ref Range   Glucose-Capillary 94 70 - 99 mg/dL   Comment 1 Notify RN   Fern Test     Status: None   Collection Time: 01/16/19  4:28 AM  Result Value Ref Range   POCT Fern Test Negative = intact amniotic membranes     Imaging:  No results found.  MAU Course: Orders Placed This Encounter  Procedures  . Urinalysis, Routine w reflex microscopic  . Glucose, capillary  . Fern Test  . Discharge patient   No orders of the defined types were placed in this encounter.   MDM: Reactive tracing with fetal movement noted.  Irregular contractions on monitor. Abdomen soft & non tender On exam, small amount of pink mucoid bleeding consistent with bloody show. Cervix is fingertip & posterior but feels somewhat thinned out. No frank bleeding.   RH negative. Received rhogam in the office at [redacted] wks gestation  Pt is gestational diabetic but has not had any OB appointments since diagnosis & has not been checking her blood sugars at home. Ate pretzels about an hour prior to arrival. CBG in MAU is 94.  Patient has an appointment later today at [facility]. Stressed importance of keeping appointments and managing her diabetes. Pt states she will keep this appointment. Her mother is at the bedside and agrees with plan.   Assessment: 1. Vaginal bleeding in pregnancy, third trimester   2. [redacted] weeks gestation of pregnancy   3. Gestational diabetes mellitus (GDM) in third trimester, gestational diabetes method of control unspecified     Plan: Discharge home in stable condition.  Labor precautions and fetal kick counts Patient  states she will definitely keep her appointment later today Discussed reasons to return to MAU  Follow-up Information    [facility] Follow up.   Specialty: Obstetrics and Gynecology Why: return for worsening symptoms Contact information: [address] [phone] Wilhe[place] Bonito [place] [address] [phone]          Allergies as of 01/16/2019      Reactions   Penicillins Hives   DID THE REACTION INVOLVE: Swelling of the face/tongue/throat, SOB, or low BP? Yes Sudden or severe rash/hives, skin peeling, or the inside of the mouth or nose? Yes Did it require medical treatment? No When did it last happen?2019 If all above answers are "NO", may proceed with cephalosporin  use.      Medication List    STOP taking these medications   famotidine 40 MG tablet Commonly known as: PEPCID   terconazole 0.8 % vaginal cream Commonly known as: TERAZOL 3     TAKE these medications   Accu-Chek Guide test strip Generic drug: glucose blood Use as instructed   ondansetron 8 MG tablet Commonly known as: ZOFRAN Take by mouth every 8 (eight) hours as needed for nausea or vomiting.   prenatal multivitamin Tabs tablet Take 1 tablet by mouth daily at 12 noon.   raNITIdine HCl Powd TWICE A Carnella [place], NP 01/16/2019 4:39 AM

## 2019-01-16 NOTE — MAU Note (Addendum)
Pt reports to MAU c/o vaginal bleeding that was heavy like a period that started 1.5 hours ago. Pt reports loosing her mucous plug. No LOF per pt. No ctx. Some slight abdominal cramping earlier. Pt reports pain in her tailbone. DFM.

## 2019-01-18 LAB — CERVICOVAGINAL ANCILLARY ONLY
Chlamydia: NEGATIVE
Comment: NEGATIVE
Comment: NORMAL
Neisseria Gonorrhea: NEGATIVE

## 2019-01-18 LAB — STREP GP B NAA: Strep Gp B NAA: NEGATIVE

## 2019-01-23 ENCOUNTER — Telehealth: Payer: Medicaid Other | Admitting: Obstetrics and Gynecology

## 2019-01-23 ENCOUNTER — Encounter: Payer: Self-pay | Admitting: Obstetrics and Gynecology

## 2019-01-23 DIAGNOSIS — O24419 Gestational diabetes mellitus in pregnancy, unspecified control: Secondary | ICD-10-CM | POA: Insufficient documentation

## 2019-01-23 DIAGNOSIS — Z3480 Encounter for supervision of other normal pregnancy, unspecified trimester: Secondary | ICD-10-CM

## 2019-01-23 NOTE — Progress Notes (Signed)
Patient missed virtual visits. Two voicemail messages were left and patient never returned call. Patient with diagnosis of GDM and missed diabetes education appointment on 01/11/19. Will attempt to reschedule. Will plan to have patient come in the office for her rescheduled visit to obtain an A1C to the least

## 2019-01-24 ENCOUNTER — Encounter: Payer: Medicaid Other | Admitting: Obstetrics & Gynecology

## 2019-01-24 DIAGNOSIS — O24419 Gestational diabetes mellitus in pregnancy, unspecified control: Secondary | ICD-10-CM

## 2019-01-28 ENCOUNTER — Other Ambulatory Visit: Payer: Self-pay

## 2019-01-28 ENCOUNTER — Inpatient Hospital Stay (HOSPITAL_COMMUNITY): Payer: Medicaid Other | Admitting: Anesthesiology

## 2019-01-28 ENCOUNTER — Encounter (HOSPITAL_COMMUNITY): Payer: Self-pay | Admitting: Obstetrics & Gynecology

## 2019-01-28 ENCOUNTER — Inpatient Hospital Stay (HOSPITAL_COMMUNITY)
Admission: AD | Admit: 2019-01-28 | Discharge: 2019-01-30 | DRG: 807 | Disposition: A | Discharge status: Home / Self Care | Payer: Medicaid Other | Attending: Obstetrics & Gynecology | Admitting: Obstetrics & Gynecology

## 2019-01-28 DIAGNOSIS — O24419 Gestational diabetes mellitus in pregnancy, unspecified control: Secondary | ICD-10-CM

## 2019-01-28 DIAGNOSIS — O24420 Gestational diabetes mellitus in childbirth, diet controlled: Secondary | ICD-10-CM | POA: Diagnosis present

## 2019-01-28 DIAGNOSIS — O139 Gestational [pregnancy-induced] hypertension without significant proteinuria, unspecified trimester: Secondary | ICD-10-CM | POA: Diagnosis present

## 2019-01-28 DIAGNOSIS — Z6791 Unspecified blood type, Rh negative: Secondary | ICD-10-CM | POA: Diagnosis not present

## 2019-01-28 DIAGNOSIS — O134 Gestational [pregnancy-induced] hypertension without significant proteinuria, complicating childbirth: Secondary | ICD-10-CM | POA: Diagnosis not present

## 2019-01-28 DIAGNOSIS — Z880 Allergy status to penicillin: Secondary | ICD-10-CM

## 2019-01-28 DIAGNOSIS — O360130 Maternal care for anti-D [Rh] antibodies, third trimester, not applicable or unspecified: Secondary | ICD-10-CM | POA: Diagnosis not present

## 2019-01-28 DIAGNOSIS — Z20822 Contact with and (suspected) exposure to covid-19: Secondary | ICD-10-CM | POA: Diagnosis present

## 2019-01-28 DIAGNOSIS — O09299 Supervision of pregnancy with other poor reproductive or obstetric history, unspecified trimester: Secondary | ICD-10-CM

## 2019-01-28 DIAGNOSIS — O26893 Other specified pregnancy related conditions, third trimester: Secondary | ICD-10-CM | POA: Diagnosis present

## 2019-01-28 DIAGNOSIS — R030 Elevated blood-pressure reading, without diagnosis of hypertension: Secondary | ICD-10-CM | POA: Diagnosis present

## 2019-01-28 DIAGNOSIS — O09899 Supervision of other high risk pregnancies, unspecified trimester: Secondary | ICD-10-CM

## 2019-01-28 DIAGNOSIS — Z3480 Encounter for supervision of other normal pregnancy, unspecified trimester: Secondary | ICD-10-CM

## 2019-01-28 DIAGNOSIS — Z3A38 38 weeks gestation of pregnancy: Secondary | ICD-10-CM

## 2019-01-28 LAB — COMPREHENSIVE METABOLIC PANEL
ALT: 16 U/L (ref 0–44)
AST: 17 U/L (ref 15–41)
Albumin: 3.1 g/dL — ABNORMAL LOW (ref 3.5–5.0)
Alkaline Phosphatase: 159 U/L — ABNORMAL HIGH (ref 38–126)
Anion gap: 11 (ref 5–15)
BUN: 6 mg/dL (ref 6–20)
CO2: 21 mmol/L — ABNORMAL LOW (ref 22–32)
Calcium: 9.1 mg/dL (ref 8.9–10.3)
Chloride: 104 mmol/L (ref 98–111)
Creatinine, Ser: 0.64 mg/dL (ref 0.44–1.00)
GFR calc Af Amer: >60 mL/min (ref >60)
GFR calc non Af Amer: >60 mL/min (ref >60)
Glucose, Bld: 91 mg/dL (ref 70–99)
Potassium: 3.7 mmol/L (ref 3.5–5.1)
Sodium: 136 mmol/L (ref 135–145)
Total Bilirubin: 0.8 mg/dL (ref 0.3–1.2)
Total Protein: 5.8 g/dL — ABNORMAL LOW (ref 6.5–8.1)

## 2019-01-28 LAB — GLUCOSE, CAPILLARY
Glucose-Capillary: 84 mg/dL (ref 70–99)
Glucose-Capillary: 70 mg/dL (ref 70–99)
Glucose-Capillary: 81 mg/dL (ref 70–99)

## 2019-01-28 LAB — PROTEIN / CREATININE RATIO, URINE
Creatinine, Urine: 168.1 mg/dL
Protein Creatinine Ratio: 0.11 mg/mg{Cre} (ref 0.00–0.15)
Total Protein, Urine: 19 mg/dL

## 2019-01-28 LAB — CBC
HCT: 35.1 % — ABNORMAL LOW (ref 36.0–46.0)
Hemoglobin: 11.8 g/dL — ABNORMAL LOW (ref 12.0–15.0)
MCH: 30.8 pg (ref 26.0–34.0)
MCHC: 33.6 g/dL (ref 30.0–36.0)
MCV: 91.6 fL (ref 80.0–100.0)
Platelets: 271 10*3/uL (ref 150–400)
RBC: 3.83 MIL/uL — ABNORMAL LOW (ref 3.87–5.11)
RDW: 13.2 % (ref 11.5–15.5)
WBC: 13.1 10*3/uL — ABNORMAL HIGH (ref 4.0–10.5)
nRBC: 0 % (ref 0.0–0.2)

## 2019-01-28 LAB — POCT FERN TEST: POCT Fern Test: POSITIVE

## 2019-01-28 LAB — SARS CORONAVIRUS 2 (TAT 6-24 HRS): SARS Coronavirus 2: NEGATIVE

## 2019-01-28 MED ORDER — LACTATED RINGERS IV SOLN: INTRAVENOUS | Status: DC

## 2019-01-28 MED ORDER — LIDOCAINE HCL (PF) 1 % IJ SOLN
INTRAMUSCULAR | Status: DC | PRN
  Administered 2019-01-28 (×2): 4 mL via EPIDURAL

## 2019-01-28 MED ORDER — PHENYLEPHRINE 40 MCG/ML (10ML) SYRINGE FOR IV PUSH (FOR BLOOD PRESSURE SUPPORT): 80.0000 ug | PREFILLED_SYRINGE | INTRAVENOUS | Status: DC | PRN

## 2019-01-28 MED ORDER — LACTATED RINGERS IV SOLN
500.0000 mL | INTRAVENOUS | Status: DC | PRN
  Administered 2019-01-28: 23:00:00 500 mL via INTRAVENOUS

## 2019-01-28 MED ORDER — EPHEDRINE 5 MG/ML INJ: 10.0000 mg | INTRAVENOUS | Status: DC | PRN

## 2019-01-28 MED ORDER — LACTATED RINGERS IV SOLN
500.0000 mL | Freq: Once | INTRAVENOUS | Status: AC
  Administered 2019-01-28: 500 mL via INTRAVENOUS

## 2019-01-28 MED ORDER — FENTANYL CITRATE (PF) 100 MCG/2ML IJ SOLN: 50.0000 ug | INTRAMUSCULAR | Status: DC | PRN

## 2019-01-28 MED ORDER — ACETAMINOPHEN 325 MG PO TABS: 650.0000 mg | ORAL_TABLET | ORAL | Status: DC | PRN

## 2019-01-28 MED ORDER — SOD CITRATE-CITRIC ACID 500-334 MG/5ML PO SOLN
30.0000 mL | ORAL | Status: DC | PRN
  Administered 2019-01-28: 30 mL via ORAL
  Filled 2019-01-28: qty 30

## 2019-01-28 MED ORDER — LIDOCAINE HCL (PF) 1 % IJ SOLN: 30.0000 mL | INTRAMUSCULAR | Status: DC | PRN

## 2019-01-28 MED ORDER — FENTANYL-BUPIVACAINE-NACL 0.5-0.125-0.9 MG/250ML-% EP SOLN
EPIDURAL | Status: AC
  Filled 2019-01-28: qty 250

## 2019-01-28 MED ORDER — LIDOCAINE-EPINEPHRINE (PF) 2 %-1:200000 IJ SOLN
INTRAMUSCULAR | Status: DC | PRN
  Administered 2019-01-28: 8 mL via EPIDURAL

## 2019-01-28 MED ORDER — SODIUM CHLORIDE (PF) 0.9 % IJ SOLN
INTRAMUSCULAR | Status: DC | PRN
  Administered 2019-01-28: 12 mL/h via EPIDURAL

## 2019-01-28 MED ORDER — DIPHENHYDRAMINE HCL 50 MG/ML IJ SOLN: 12.5000 mg | INTRAMUSCULAR | Status: DC | PRN

## 2019-01-28 MED ORDER — OXYTOCIN BOLUS FROM INFUSION
500.0000 mL | Freq: Once | INTRAVENOUS | Status: AC
  Administered 2019-01-29: 500 mL via INTRAVENOUS

## 2019-01-28 MED ORDER — OXYTOCIN 40 UNITS IN NORMAL SALINE INFUSION - SIMPLE MED
2.5000 [IU]/h | INTRAVENOUS | Status: DC
  Filled 2019-01-28: qty 1000

## 2019-01-28 MED ORDER — ONDANSETRON HCL 4 MG/2ML IJ SOLN
4.0000 mg | Freq: Four times a day (QID) | INTRAMUSCULAR | Status: DC | PRN
  Administered 2019-01-28 (×2): 4 mg via INTRAVENOUS
  Filled 2019-01-28 (×2): qty 2

## 2019-01-28 MED ORDER — FENTANYL-BUPIVACAINE-NACL 0.5-0.125-0.9 MG/250ML-% EP SOLN: 12.0000 mL/h | EPIDURAL | Status: DC | PRN

## 2019-01-28 MED ORDER — LACTATED RINGERS IV SOLN: 500.0000 mL | Freq: Once | INTRAVENOUS | Status: DC

## 2019-01-28 NOTE — H&P (Signed)
LABOR AND DELIVERY ADMISSION HISTORY AND PHYSICAL NOTE  Christy [name] is a 24 y.o. female G2P0010 with IUP at [redacted]w[redacted]d by 6wk [place] presenting for SROM and early labor.   She reports positive fetal movement. She endorses leaking of fluid starting around an hour prior to arrival accompanied by strong contractions. Denies vaginal bleeding.   She plans on breast feeding. Her contraception plan is: Depo.  Prenatal History/Complications: PNC at [facility]:  @[redacted]w[redacted]d , CWD, normal anatomy, cephalic presentation, posterior placenta, 39%ile, EFW 2681 g  Pregnancy complications:  - GDMA1  Past Medical History: Past Medical History:  Diagnosis Date  . Anemia   . Chlamydia 2018, 2019  . Gestational diabetes   . Gonorrhea 2018, 2020  . Infection    UTI    Past Surgical History: Past Surgical History:  Procedure Laterality Date  . NO PAST SURGERIES      Obstetrical History: OB History    Gravida  2   Para  0   Term  0   Preterm  0   AB  1   Living  0     SAB  1   TAB  0   Ectopic  0   Multiple  0   Live Births  0           Social History: Social History   Socioeconomic History  . Marital status: Single    Spouse name: Not on file  . Number of children: Not on file  . Years of education: Not on file  . Highest education level: Not on file  Occupational History  . Not on file  Tobacco Use  . Smoking status: Never Smoker  . Smokeless tobacco: Never Used  Substance and Sexual Activity  . Alcohol use: No  . Drug use: Not Currently    Types: Marijuana    Comment: last smoked 2 days ago, est 3 x weekly  . Sexual activity: Yes    Birth control/protection: None  Other Topics Concern  . Not on file  Social History Narrative  . Not on file   Social Determinants of Health   Financial Resource Strain:   . Difficulty of Paying Living Expenses: Not on file  Food Insecurity:   . Worried About 2021 in the Last Year: Not on file  . Ran Out  of Food in the Last Year: Not on file  Transportation Needs:   . Lack of Transportation (Medical): Not on file  . Lack of Transportation (Non-Medical): Not on file  Physical Activity:   . Days of Exercise per Week: Not on file  . Minutes of Exercise per Session: Not on file  Stress:   . Feeling of Stress : Not on file  Social Connections:   . Frequency of Communication with Friends and Family: Not on file  . Frequency of Social Gatherings with Friends and Family: Not on file  . Attends Religious Services: Not on file  . Active Member of Clubs or Organizations: Not on file  . Attends Programme researcher, broadcasting/film/video Meetings: Not on file  . Marital Status: Not on file    Family History: Family History  Problem Relation Age of Onset  . Hypertension Mother   . Hypertension Father   . Hypertension Maternal Grandmother     Allergies: Allergies  Allergen Reactions  . Penicillins Hives    DID THE REACTION INVOLVE: Swelling of the face/tongue/throat, SOB, or low BP? Yes Sudden or severe rash/hives, skin peeling, or  the inside of the mouth or nose? Yes Did it require medical treatment? No When did it last happen?2019 If all above answers are "NO", may proceed with cephalosporin use.    Medications Prior to Admission  Medication Sig Dispense Refill Last Dose  . glucose blood (ACCU-CHEK GUIDE) test strip Use as instructed 100 each 12   . ondansetron (ZOFRAN) 8 MG tablet Take by mouth every 8 (eight) hours as needed for nausea or vomiting.     . Prenatal Vit-Fe Fumarate-FA (PRENATAL MULTIVITAMIN) TABS tablet Take 1 tablet by mouth daily at 12 noon.     . raNITIdine HCl POWD TWICE A DAY 60 g 0      Review of Systems  All systems reviewed and negative except as stated in HPI  Physical Exam Last menstrual period 03/12/2018, unknown if currently breastfeeding. General appearance: alert, oriented, uncomfortable appearing Lungs: normal respiratory effort Heart: regular rate Abdomen:  soft, non-tender; gravid, leopolds 2800 grams Extremities: No calf swelling or tenderness Presentation: cephalic by bedside ultrasound  Fetal monitoringBaseline: 155 bpm, Variability: Good {> 6 bpm), Accelerations: Reactive and Decelerations: Variable: moderate Uterine activityFrequency: Every 2-3 minutes  Dilation: 3.5 Effacement (%): 90 Station: -2, -3(pt pushed rn hand out of vagina ) Exam by:: n druebbisch rn  Prenatal labs: ABO, Rh: --/--/A NEG, A NEG (04/15 1513) Antibody: NEG (04/15 1513) Rubella: Immune (08/28 0000) RPR: Non Reactive (11/18 0925)  HBsAg: Negative (08/28 0000)  HIV: Non Reactive (11/18 0925)  GC/Chlamydia: neg/neg 01/16/2019  GBS: --Henderson Cloud (01/05 0431)  2-hr GTT: abnormal (77, 184, 68) Genetic screening:  Low risk panorama Anatomy US: normal  Prenatal Transfer Tool  Maternal Diabetes: Yes:  Diabetes Type:  Diet controlled Genetic Screening: Normal Maternal Ultrasounds/Referrals: Normal Fetal Ultrasounds or other Referrals:  None Maternal Substance Abuse:  No Significant Maternal Medications:  None Significant Maternal Lab Results: Group B Strep negative and Rh negative  No results found for this or any previous visit (from the past 24 hour(s)).  Patient Active Problem List   Diagnosis Date Noted  . Normal labor 01/28/2019  . Gestational diabetes mellitus (GDM) affecting pregnancy, antepartum 01/23/2019  . Supervision of other normal pregnancy, antepartum 11/29/2018  . History of maternal chlamydia infection, currently pregnant 11/29/2018  . Nausea and vomiting in pregnancy 11/29/2018  . Heartburn in pregnancy 11/29/2018    Assessment: Christy [name] is a 24 y.o. G2P0010 at [redacted]w[redacted]d here for SROM w meconium and early labor.   #Labor: expectant management for now, augment PRN #Pain: IV pain meds PRN, requests epidural #FWB: Cat II for variable decelerations but overall reassuring with moderate variability and accels #GBS/ID:  negative #COVID: swab pending #MOF: Breast #MOC: Depo #Circ: n/a  #GDMA1: recent US with normal EFW. CBG q4h latent labor, q2h active labor  #Rh neg: Rhogam eval PP  Clarnce Flock 01/28/2019, 12:54 PM

## 2019-01-28 NOTE — Anesthesia Preprocedure Evaluation (Addendum)
Anesthesia Evaluation  Patient identified by MRN, date of birth, ID band Patient awake    Reviewed: Allergy & Precautions, NPO status , Patient's Chart, lab work & pertinent test results  History of Anesthesia Complications Negative for: history of anesthetic complications  Airway Mallampati: II  TM Distance: >3 FB Neck ROM: Full    Dental no notable dental hx.    Pulmonary neg pulmonary ROS,    Pulmonary exam normal        Cardiovascular negative cardio ROS Normal cardiovascular exam     Neuro/Psych negative neurological ROS  negative psych ROS   GI/Hepatic negative GI ROS, Neg liver ROS,   Endo/Other  diabetes, Gestational  Renal/GU negative Renal ROS  negative genitourinary   Musculoskeletal negative musculoskeletal ROS (+)   Abdominal   Peds  Hematology negative hematology ROS (+)   Anesthesia Other Findings Day of surgery medications reviewed with patient.  Reproductive/Obstetrics (+) Pregnancy                            Anesthesia Physical Anesthesia Plan  ASA: II  Anesthesia Plan: Epidural   Post-op Pain Management:    Induction:   PONV Risk Score and Plan: Treatment may vary due to age or medical condition  Airway Management Planned: Natural Airway  Additional Equipment:   Intra-op Plan:   Post-operative Plan:   Informed Consent: I have reviewed the patients History and Physical, chart, labs and discussed the procedure including the risks, benefits and alternatives for the proposed anesthesia with the patient or authorized representative who has indicated his/her understanding and acceptance.       Plan Discussed with: CRNA  Anesthesia Plan Comments:        Anesthesia Quick Evaluation

## 2019-01-28 NOTE — Progress Notes (Signed)
Labor Progress Note Sheba Whaling is a 24 y.o. G2P0010 at [redacted]w[redacted]d presented for SOL/SROM. S: Feeling ctx in back.   O:  BP 128/85   Pulse 88   Temp 99.7 F (37.6 C) (Oral)   Resp 18   LMP 03/12/2018 (LMP Unknown) Comment: miscarriage end of April, no period  SpO2 99%  EFM: 150, moderate variability, pos accels, no decels, reactive Toco: q2-79m  CVE: Dilation: 8 Effacement (%): 100 Cervical Position: Middle Station: Plus 1 Presentation: Vertex Exam by:: Dr. Morene Antu   A&P: 24 y.o. G2P0010 [redacted]w[redacted]d here for SOL/SROM. #Labor: Progressing well. No augmentation needed thus far. AROM of forebag with clear fluid at this exam. Cont expectant management. Anticipate SVD. #Pain: epidural #FWB: Cat I #GBS negative #Elevated BP's: intermittently elevated BP's noted that meet criteria for gHTN vs Pre-E; CMP and Pr/Cr ordered   Joselyn Arrow, MD 9:39 PM

## 2019-01-28 NOTE — Progress Notes (Signed)
Christy [name] is a 24 y.o. G2P0010 at [redacted]w[redacted]d by ultrasound admitted for SROM.  Subjective: Doing well, resting in bed.  No complaints.  Objective: BP 117/74   Pulse 78   Temp 98 F (36.7 C) (Axillary)   Resp 20   LMP 03/12/2018 (LMP Unknown) Comment: miscarriage end of April, no period  SpO2 99%  No intake/output data recorded. No intake/output data recorded.  FHT:  FHR: 145 bpm, variability: moderate,  accelerations:  Abscent,  decelerations:  Absent UC:   regular, every 2-3 minutes SVE:   Dilation: 4 Effacement (%): 90 Station: 0 Exam by:: AHallman,SCNM  Labs: Lab Results  Component Value Date   WBC 13.1 (H) 01/28/2019   HGB 11.8 (L) 01/28/2019   HCT 35.1 (L) 01/28/2019   MCV 91.6 01/28/2019   PLT 271 01/28/2019    Assessment / Plan: Spontaneous labor, progressing normally   Labor: Progressing normally Preeclampsia:  Denies signs and symptoms of Pre-e Fetal Wellbeing:  Category I Pain Control:  Epidural I/D:  n/a Anticipated MOD:  NSVD  Felipa Eth, SNM 01/28/2019, 5:34 PM

## 2019-01-28 NOTE — Anesthesia Procedure Notes (Signed)
Epidural Patient location during procedure: OB Start time: 01/28/2019 1:42 PM End time: 01/28/2019 1:45 PM  Staffing Anesthesiologist: Kaylyn Layer, MD Performed: anesthesiologist   Preanesthetic Checklist Completed: patient identified, IV checked, risks and benefits discussed, monitors and equipment checked, pre-op evaluation and timeout performed  Epidural Patient position: sitting Prep: DuraPrep and site prepped and draped Patient monitoring: continuous pulse ox, blood pressure and heart rate Approach: midline Location: L3-L4 Injection technique: LOR air  Needle:  Needle type: Tuohy  Needle gauge: 17 G Needle length: 9 cm Needle insertion depth: 5 cm Catheter type: closed end flexible Catheter size: 19 Gauge Catheter at skin depth: 10 cm Test dose: negative and Other (1% lidocaine)  Assessment Events: blood not aspirated, injection not painful, no injection resistance, no paresthesia and negative IV test  Additional Notes Patient identified. Risks, benefits, and alternatives discussed with patient including but not limited to bleeding, infection, nerve damage, paralysis, failed block, incomplete pain control, headache, blood pressure changes, nausea, vomiting, reactions to medication, itching, and postpartum back pain. Confirmed with bedside nurse the patient's most recent platelet count. Confirmed with patient that they are not currently taking any anticoagulation, have any bleeding history, or any family history of bleeding disorders. Patient expressed understanding and wished to proceed. All questions were answered. Sterile technique was used throughout the entire procedure. Please see nursing notes for vital signs.   Crisp LOR on first pass. Test dose was given through epidural catheter and negative prior to continuing to dose epidural or start infusion. Warning signs of high block given to the patient including shortness of breath, tingling/numbness in hands, complete  motor block, or any concerning symptoms with instructions to call for help. Patient was given instructions on fall risk and not to get out of bed. All questions and concerns addressed with instructions to call with any issues or inadequate analgesia.  Reason for block:procedure for pain

## 2019-01-28 NOTE — Progress Notes (Signed)
Labor Progress Note Christy [name] is a 24 y.o. G2P0010 at [redacted]w[redacted]d presented for SOL/SROM. S: Resting comfortably with epidural.  O:  BP 127/88   Pulse 86   Temp 99.7 F (37.6 C) (Oral)   Resp 16   LMP 03/12/2018 (LMP Unknown) Comment: miscarriage end of April, no period  SpO2 99%  EFM: 150, moderate variability, pos accels, no decels, reactive Toco: q2-58m  CVE: Dilation: 6 Effacement (%): 100 Cervical Position: Middle Station: Plus 1 Presentation: Vertex Exam by:: AHallman,student CNM   A&P: 24 y.o. G2P0010 [redacted]w[redacted]d here for SOL/SROM. #Labor: Progressing well. No augmentation needed thus far. Cont expectant management. Anticipate SVD. #Pain: epidural #FWB: Cat I #GBS negative #Elevated BP's: intermittently elevated BP's noted that meet criteria for gHTN vs Pre-E; CMP and Pr/Cr ordered   Joselyn Arrow, MD 9:18 PM

## 2019-01-28 NOTE — MAU Note (Signed)
Christy [name] is a 24 y.o. at [redacted]w[redacted]d here in MAU reporting: contractions started this morning, they come about every 5 minutes. Having some bleeding, is not wearing a pad. No LOF but is having some discharge.  Onset of complaint: today  Pain score: 10/10  FHT:175  Lab orders placed from triage: none

## 2019-01-29 ENCOUNTER — Encounter (HOSPITAL_COMMUNITY): Payer: Self-pay | Admitting: Obstetrics & Gynecology

## 2019-01-29 DIAGNOSIS — O24420 Gestational diabetes mellitus in childbirth, diet controlled: Secondary | ICD-10-CM

## 2019-01-29 DIAGNOSIS — O139 Gestational [pregnancy-induced] hypertension without significant proteinuria, unspecified trimester: Secondary | ICD-10-CM

## 2019-01-29 DIAGNOSIS — O134 Gestational [pregnancy-induced] hypertension without significant proteinuria, complicating childbirth: Secondary | ICD-10-CM

## 2019-01-29 DIAGNOSIS — O360130 Maternal care for anti-D [Rh] antibodies, third trimester, not applicable or unspecified: Secondary | ICD-10-CM

## 2019-01-29 DIAGNOSIS — Z3A38 38 weeks gestation of pregnancy: Secondary | ICD-10-CM

## 2019-01-29 HISTORY — DX: Gestational (pregnancy-induced) hypertension without significant proteinuria, unspecified trimester: O13.9

## 2019-01-29 LAB — RPR: RPR Ser Ql: NONREACTIVE

## 2019-01-29 LAB — GLUCOSE, CAPILLARY: Glucose-Capillary: 103 mg/dL — ABNORMAL HIGH (ref 70–99)

## 2019-01-29 MED ORDER — RHO D IMMUNE GLOBULIN 1500 UNIT/2ML IJ SOSY
300.0000 ug | PREFILLED_SYRINGE | Freq: Once | INTRAMUSCULAR | Status: AC
  Administered 2019-01-29: 12:00:00 300 ug via INTRAVENOUS
  Filled 2019-01-29: qty 2

## 2019-01-29 MED ORDER — MEASLES, MUMPS & RUBELLA VAC IJ SOLR: 0.5000 mL | Freq: Once | INTRAMUSCULAR | Status: DC

## 2019-01-29 MED ORDER — ONDANSETRON HCL 4 MG/2ML IJ SOLN: 4.0000 mg | INTRAMUSCULAR | Status: DC | PRN

## 2019-01-29 MED ORDER — COCONUT OIL OIL: 1.0000 "application " | TOPICAL_OIL | Status: DC | PRN

## 2019-01-29 MED ORDER — PRENATAL MULTIVITAMIN CH
1.0000 | ORAL_TABLET | Freq: Every day | ORAL | Status: DC
  Administered 2019-01-29 – 2019-01-30 (×2): 1 via ORAL
  Filled 2019-01-29 (×2): qty 1

## 2019-01-29 MED ORDER — SIMETHICONE 80 MG PO CHEW: 80.0000 mg | CHEWABLE_TABLET | ORAL | Status: DC | PRN

## 2019-01-29 MED ORDER — SENNOSIDES-DOCUSATE SODIUM 8.6-50 MG PO TABS
2.0000 | ORAL_TABLET | ORAL | Status: DC
  Administered 2019-01-30: 01:00:00 2 via ORAL
  Filled 2019-01-29: qty 2

## 2019-01-29 MED ORDER — DIPHENHYDRAMINE HCL 25 MG PO CAPS: 25.0000 mg | ORAL_CAPSULE | Freq: Four times a day (QID) | ORAL | Status: DC | PRN

## 2019-01-29 MED ORDER — WITCH HAZEL-GLYCERIN EX PADS: 1.0000 "application " | MEDICATED_PAD | CUTANEOUS | Status: DC | PRN

## 2019-01-29 MED ORDER — IBUPROFEN 600 MG PO TABS
600.0000 mg | ORAL_TABLET | Freq: Three times a day (TID) | ORAL | Status: DC | PRN
  Administered 2019-01-29 – 2019-01-30 (×4): 600 mg via ORAL
  Filled 2019-01-29 (×6): qty 1

## 2019-01-29 MED ORDER — ACETAMINOPHEN 325 MG PO TABS
650.0000 mg | ORAL_TABLET | Freq: Four times a day (QID) | ORAL | Status: DC | PRN
  Administered 2019-01-29 – 2019-01-30 (×4): 650 mg via ORAL
  Filled 2019-01-29 (×4): qty 2

## 2019-01-29 MED ORDER — MEDROXYPROGESTERONE ACETATE 150 MG/ML IM SUSP
150.0000 mg | Freq: Once | INTRAMUSCULAR | Status: AC
  Administered 2019-01-30: 11:00:00 150 mg via INTRAMUSCULAR
  Filled 2019-01-29: qty 1

## 2019-01-29 MED ORDER — DIBUCAINE (PERIANAL) 1 % EX OINT: 1.0000 "application " | TOPICAL_OINTMENT | CUTANEOUS | Status: DC | PRN

## 2019-01-29 MED ORDER — TETANUS-DIPHTH-ACELL PERTUSSIS 5-2.5-18.5 LF-MCG/0.5 IM SUSP: 0.5000 mL | Freq: Once | INTRAMUSCULAR | Status: DC

## 2019-01-29 MED ORDER — BENZOCAINE-MENTHOL 20-0.5 % EX AERO: 1.0000 "application " | INHALATION_SPRAY | CUTANEOUS | Status: DC | PRN

## 2019-01-29 MED ORDER — ONDANSETRON HCL 4 MG PO TABS: 4.0000 mg | ORAL_TABLET | ORAL | Status: DC | PRN

## 2019-01-29 NOTE — Discharge Summary (Signed)
Postpartum Discharge Summary     Patient Name: Christy [name] DOB: 02-06-1995 MRN: [id number]  Date of admission: 01/28/2019 Delivering Provider: Chauncey Mann   Date of discharge: 01/30/2019  Admitting diagnosis: Normal labor [O80, Z37.9] Intrauterine pregnancy: [redacted]w[redacted]d    Secondary diagnosis:  Active Problems:   Supervision of other normal pregnancy, antepartum   History of maternal chlamydia infection, currently pregnant   Gestational diabetes mellitus (GDM) affecting pregnancy, antepartum   Normal labor   Gestational hypertension   SVD (spontaneous vaginal delivery)  Additional problems: None     Discharge diagnosis: Term Pregnancy Delivered, Gestational Hypertension and GDM A1                                                                                                Post partum procedures:Rhogam injection and Depo injection  Augmentation: AROM  Complications: None  Hospital course:  Onset of Labor With Vaginal Delivery     24y.o. yo G2P0010 at 374w5das admitted in Latent Labor on 01/28/2019. Patient had an uncomplicated labor course as follows: Initial SVE: 3.5/90/-2. Patient progressed without augmentation; did have AROM of forebag. Received epidural. She then progressed to complete.  Membrane Rupture Time/Date: 9:34 PM ,01/28/2019   Intrapartum Procedures: Episiotomy: None [1]                                         Lacerations:  Labial [10];Periurethral [8]  Patient had a delivery of a Viable infant. 01/29/2019  Information for the patient's newborn:  MiIma, Hafner0[[id number]]Delivery Method: Vaginal, Spontaneous(Filed from Delivery Summary)     Pateint had an uncomplicated postpartum course. Fasting AM glucose 103. Received Depo prior to discharge home. BP's stable. She is ambulating, tolerating a regular diet, passing flatus, and urinating well. Patient is discharged home in stable condition on 01/30/19.  Delivery time: 12:16 AM    Magnesium  Sulfate received: No BMZ received: No Rhophylac:Yes MMR:No Transfusion:No  Physical exam  Vitals:   01/29/19 1057 01/29/19 1526 01/29/19 2105 01/30/19 0454  BP: 122/72 111/76 114/84 118/75  Pulse: 78 63 69 66  Resp: _0 Temp: (!) 97.3 F (36.3 C) 97.8 F (36.6 C) 98.6 F (37 C) 99 F (37.2 C)  TempSrc: Oral Oral Oral Oral  SpO2:       General: alert, cooperative and no distress Lochia: appropriate Uterine Fundus: firm Incision: N/A DVT Evaluation: No evidence of DVT seen on physical exam. No cords or calf tenderness. No significant calf/ankle edema. Labs: Lab Results  Component Value Date   WBC 13.1 (H) 01/28/2019   HGB 11.8 (L) 01/28/2019   HCT 35.1 (L) 01/28/2019   MCV 91.6 01/28/2019   PLT 271 01/28/2019   CMP Latest Ref Rng & Units 01/28/2019  Glucose 70 - 99 mg/dL 91  BUN 6 - 20 mg/dL 6  Creatinine 0.44 - 1.00 mg/dL 0.64  Sodium 135 - 145 mmol/L 136  Potassium 3.5 - 5.1 mmol/L 3.7  Chloride 98 - 111 mmol/L 104  CO2 22 - 32 mmol/L 21(L)  Calcium 8.9 - 10.3 mg/dL 9.1  Total Protein 6.5 - 8.1 g/dL 5.8(L)  Total Bilirubin 0.3 - 1.2 mg/dL 0.8  Alkaline Phos 38 - 126 U/L 159(H)  AST 15 - 41 U/L 17  ALT 0 - 44 U/L 16    Discharge instruction: per After Visit Summary and "Baby and Me Booklet".  After visit meds:  Allergies as of 01/30/2019      Reactions   Penicillins Hives   DID THE REACTION INVOLVE: Swelling of the face/tongue/throat, SOB, or low BP? Yes Sudden or severe rash/hives, skin peeling, or the inside of the mouth or nose? Yes Did it require medical treatment? No When did it last happen?2019 If all above answers are "NO", may proceed with cephalosporin use.      Medication List    STOP taking these medications   Accu-Chek Guide test strip Generic drug: glucose blood     TAKE these medications   ibuprofen 600 MG tablet Commonly known as: ADVIL Take 1 tablet (600 mg total) by mouth every 8 (eight) hours as needed for mild  pain.   ondansetron 8 MG tablet Commonly known as: ZOFRAN Take by mouth every 8 (eight) hours as needed for nausea or vomiting.   prenatal multivitamin Tabs tablet Take 1 tablet by mouth daily at 12 noon.   raNITIdine HCl Powd TWICE A DAY       Diet: routine diet  Activity: Advance as tolerated. Pelvic rest for 6 weeks.   Outpatient follow up:4 weeks Follow up Appt: Future Appointments  Date Time Provider Department Center  02/06/2019 10:30 AM [facility] NURSE [facility] None  03/13/2019  9:00 AM [facility] LAB [facility] None  03/13/2019  9:30 AM Arnold, James G, MD [facility] None   Follow up Visit: Follow-up Information    [facility] Follow up.   Specialty: Obstetrics and Gynecology Why: Schedule blood pressure check in 1 week and 4 weeks for postpartum appointment  Contact information: [address], [address] [place] [address] [phone]           Please schedule this patient for Postpartum visit in: 4 weeks with the following provider: Any provider Low risk pregnancy complicated by: GDM Delivery mode:  SVD Anticipated Birth Control:  Depo in hospital prior to discharge  PP Procedures needed: GTT and BP check  Schedule Integrated BH visit: no   Newborn Data: Live born female  Birth Weight: 2804g (6lb 2.9oz)  APGAR: 8, 9  Newborn Delivery   Birth date/time: 01/29/2019 00:16:00 Delivery type: Vaginal, Spontaneous      Baby Feeding: Breast Disposition:rooming in   01/30/2019  C , CNM   

## 2019-01-29 NOTE — Lactation Note (Signed)
This note was copied from a baby's chart. Lactation Consultation Note  Patient Name: Christy [name] [id number] Date: 01/29/2019 Reason for consult: Initial assessment;Early term 37-38.6wks;Primapara;1st time breastfeeding  P1 mother whose infant is now 57 hours old.  This is an ETI at 38+5 weeks.  Mother's feeding preference is breast/bottle.  Mother had just finished bottle feeding with Daron Offer when I arrived.  Discussed with mother her feeding goals and mother is not interested in latching.  However, she stated she wants to "try" to pump and bottle feed.  Education provided on milk supply and how to obtain and maintain a good milk supply.  Expressed that mother will need to be diligent about pumping 8-12 times/24 hours.  Mother seems uncertain as to whether or not she will be able to do this.  She is familiar with hand expression and said "nothing came out."  Again, reiterated the milk coming to volume and what steps are necessary to obtain a good milk supply.  Reminded her that she may not always be able to hand express much more than a few drops in the beginning but, with continued practice, she should get more drops.   Mother's breasts are soft and non tender and nipples are everted and intact.  Initiated the DEBP: pump parts, assembly, disassembly and cleaning reviewed.  Observed mother pumping for approximately 5 minutes and #24 flange size is appropriate at this time.  Mother may need further practice with setting up her pump.  Her mother is her support person but she did not breast feed at all.  Encouraged feeding 8-12 times/24 hours or sooner if baby shows cues.  Mother will feed baby at least every three hours.  Suggested mother pump immediately after feeding and give back any EBM she obtains to baby.  Colostrum container provided and milk storage times reviewed.  Finger feeding demonstrated.    Mom made aware of O/P services, breastfeeding support groups, community resources, and our  phone # for post-discharge questions.  Mother does not have a DEBP for home use.  She informed me that she will have one at discharge.  Encouraged her to call her RN/LC for any questions/concerns.     Maternal Data Formula Feeding for Exclusion: Yes Reason for exclusion: Mother's choice to formula and breast feed on admission Has patient been taught Hand Expression?: Yes Does the patient have breastfeeding experience prior to this delivery?: No  Feeding Feeding Type: Bottle Fed - Formula  LATCH Score                   Interventions    Lactation Tools Discussed/Used Pump Review: Setup, frequency, and cleaning;Milk Storage Initiated by:: Raford Brissett Date initiated:: 01/30/19   Consult Status Consult Status: Follow-up Date: 01/30/19 Follow-up type: In-patient    Dora Sims 01/29/2019, 12:31 PM

## 2019-01-29 NOTE — Anesthesia Postprocedure Evaluation (Signed)
Anesthesia Post Note  Patient: Jalila Goodnough Alcoser  Procedure(s) Performed: AN AD HOC LABOR EPIDURAL     Patient location during evaluation: Mother Baby Anesthesia Type: Epidural Level of consciousness: awake and alert and oriented Pain management: satisfactory to patient Vital Signs Assessment: post-procedure vital signs reviewed and stable Respiratory status: spontaneous breathing and nonlabored ventilation Cardiovascular status: stable Postop Assessment: no headache, no backache, no signs of nausea or vomiting, adequate PO intake, patient able to bend at knees and able to ambulate (patient up walking) Anesthetic complications: no    Last Vitals:  Vitals:   01/29/19 0400 01/29/19 0736  BP: 108/90 125/87  Pulse: 75 (!) 52  Resp: 16 18  Temp: 37.1 C 36.6 C  SpO2: 100% 100%    Last Pain:  Vitals:   01/29/19 0858  TempSrc:   PainSc: 7    Pain Goal: Patients Stated Pain Goal: 10 (01/28/19 1321)                 Madison Hickman

## 2019-01-29 NOTE — Progress Notes (Signed)
Patient missed appointment.

## 2019-01-29 NOTE — Plan of Care (Signed)

## 2019-01-30 LAB — RH IG WORKUP (INCLUDES ABO/RH)
ABO/RH(D): A NEG
Fetal Screen: NEGATIVE
Gestational Age(Wks): 38
Unit division: 0

## 2019-01-30 MED ORDER — IBUPROFEN 600 MG PO TABS: 600.0000 mg | ORAL_TABLET | Freq: Three times a day (TID) | ORAL | 0 refills | Status: DC | PRN

## 2019-01-30 NOTE — Lactation Note (Signed)
This note was copied from a baby's chart. Lactation Consultation Note  Patient Name: Christy [name] [facility]'E Date: 01/30/2019 Reason for consult: Follow-up assessment Mom is mostly formula feeding but plans on doing more breastfeeding.  She has put baby to breast a few times and pumped.  She has questions about baby spitting.  Discussed milk coming to volume and the prevention and treatment of engorgement.  Mom plans on getting a pump after discharge.  Reviewed outpatient services and encouraged to call prn.  Maternal Data    Feeding Feeding Type: Formula Nipple Type: Extra Slow Flow  LATCH Score                   Interventions    Lactation Tools Discussed/Used     Consult Status Consult Status: Complete Follow-up type: Call as needed    Huston Foley 01/30/2019, 12:13 PM

## 2019-01-30 NOTE — Plan of Care (Signed)
  Problem: Activity: Goal: Will verbalize the importance of balancing activity with adequate rest periods Outcome: Completed/Met Goal: Ability to tolerate increased activity will improve Outcome: Completed/Met   Problem: Life Cycle: Goal: Chance of risk for complications during the postpartum period will decrease Outcome: Completed/Met   Problem: Role Relationship: Goal: Ability to demonstrate positive interaction with newborn will improve Outcome: Completed/Met   Problem: Education: Goal: Knowledge of condition will improve Outcome: Completed/Met   Problem: Activity: Goal: Ability to tolerate increased activity will improve Outcome: Completed/Met   Problem: Life Cycle: Goal: Chance of risk for complications during the postpartum period will decrease Outcome: Completed/Met   Problem: Role Relationship: Goal: Ability to demonstrate positive interaction with newborn will improve Outcome: Completed/Met

## 2019-01-30 NOTE — Clinical Social Work Maternal (Signed)
CLINICAL SOCIAL WORK MATERNAL/CHILD NOTE  Patient Details  Name: Christy [name] MRN: [id number] Date of Birth: 1996-01-11  Date:  01/30/2019  Clinical Social Worker Initiating Note:  Durward Fortes, LCSW Date/Time: Initiated:  01/30/19/1045     Child's Name:  Christy [name]   Biological Parents:  Mother   Need for Interpreter:  None   Reason for Referral:  Current Substance Use/Substance Use During Pregnancy    Address:  [address] [place] [address]    Phone number:  [phone] (home)     Additional phone number: none reported.   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 CSW went to speak with Christy [name] at bedside to address further needs.   CSW entered the room and congratulated Christy [name] on  the birth of infant. CSW advised Christy [name] of CSW's role and the reason for CSW coming to visit with her. Christy [name] reported that she did use THC when she first found out that sh was pregnant. Christy [name] reported "I had a miscarriage and so when I used I didn't know that I was pregnant". Christy [name] reported that  this was her last use (around June 2020). CSW understanding and advised Christy [name] of the hospital drug screen policy. CSW advised Christy [name] that infants UDS was negative however. CSW would still need to monitor infants CDS. Christy [name] was informed that if CDS is positive for any substances that she wasn't given while here or prescribed by a MD then CSW would need to make a CPS report. Christy [name] reported that she understood and expressed that she didn't use any other substances aside from [facility] while pregnant.   CSW inquired from [facility] on her mental health history. Christy [name] reported that she has no mental health history. Christy [name] reported that she hasn't felt SI or HI and expressed that she had no other needs.   CSW took time to provide Christy [name] with  PPD and SIDS education. Christy [name] was given PPD Checklist in order to keep track of he feelings as they relate to PPD.   CSW will continue to monitor infants CDS and make CPS report if needed.   CSW Plan/Description:  No Further Intervention Required/No Barriers to Discharge, Sudden Infant Death Syndrome (SIDS) Education, Perinatal Mood and Anxiety Disorder (PMADs) Education, CSW Will Continue to Monitor Umbilical Cord Tissue Drug Screen Results and Make Report if [facility] Drug Screen Policy Information    Loralie Champagne 01/30/2019, 11:32 AM

## 2019-01-31 ENCOUNTER — Ambulatory Visit: Payer: Medicaid Other

## 2019-02-01 LAB — BPAM RBC
Blood Product Expiration Date: 202102092359
Blood Product Expiration Date: 202102102359
ISSUE DATE / TIME: 202101201452
Unit Type and Rh: 600
Unit Type and Rh: 600

## 2019-02-01 LAB — TYPE AND SCREEN
ABO/RH(D): A NEG
Antibody Screen: POSITIVE
Unit division: 0
Unit division: 0

## 2019-02-06 ENCOUNTER — Ambulatory Visit: Payer: Medicaid Other

## 2019-02-06 VITALS — BP 100/92 | HR 99

## 2019-02-06 DIAGNOSIS — Z0130 Encounter for examination of blood pressure without abnormal findings: Secondary | ICD-10-CM

## 2019-02-06 NOTE — Progress Notes (Signed)
Called pt for virtual BP visit. Pt reports BP as 100/92, pulse 99. Pt denies any abnormal symptoms. Advised pt to retake BP, pt agreed and call dropped.  Called pt, no answer, left vm. Previously advised pt of pp visit on 03-13-19 before call was dropped.

## 2019-02-07 NOTE — Progress Notes (Signed)
Patient seen and assessed by nursing staff during this encounter. I have reviewed the chart and agree with the documentation and plan.  Catalina Antigua, MD 02/07/2019 8:47 AM

## 2019-03-13 ENCOUNTER — Other Ambulatory Visit: Payer: Medicaid Other

## 2019-03-13 ENCOUNTER — Ambulatory Visit: Payer: Medicaid Other | Admitting: Obstetrics & Gynecology

## 2019-03-23 ENCOUNTER — Other Ambulatory Visit: Payer: Self-pay

## 2019-03-23 ENCOUNTER — Other Ambulatory Visit: Payer: Medicaid Other

## 2019-03-23 ENCOUNTER — Encounter: Payer: Self-pay | Admitting: Obstetrics and Gynecology

## 2019-03-23 ENCOUNTER — Ambulatory Visit (INDEPENDENT_AMBULATORY_CARE_PROVIDER_SITE_OTHER): Payer: Medicaid Other | Admitting: Obstetrics and Gynecology

## 2019-03-23 DIAGNOSIS — Z3042 Encounter for surveillance of injectable contraceptive: Secondary | ICD-10-CM

## 2019-03-23 DIAGNOSIS — Z309 Encounter for contraceptive management, unspecified: Secondary | ICD-10-CM | POA: Insufficient documentation

## 2019-03-23 DIAGNOSIS — Z8632 Personal history of gestational diabetes: Secondary | ICD-10-CM | POA: Insufficient documentation

## 2019-03-23 MED ORDER — MEDROXYPROGESTERONE ACETATE 150 MG/ML IM SUSP: 150.0000 mg | INTRAMUSCULAR | 3 refills | Status: DC

## 2019-03-23 NOTE — Patient Instructions (Signed)
Health Maintenance, Female Adopting a healthy lifestyle and getting preventive care are important in promoting health and wellness. Ask your health care provider about:  The right schedule for you to have regular tests and exams.  Things you can do on your own to prevent diseases and keep yourself healthy. What should I know about diet, weight, and exercise? Eat a healthy diet   Eat a diet that includes plenty of vegetables, fruits, low-fat dairy products, and lean protein.  Do not eat a lot of foods that are high in solid fats, added sugars, or sodium. Maintain a healthy weight Body mass index (BMI) is used to identify weight problems. It estimates body fat based on height and weight. Your health care provider can help determine your BMI and help you achieve or maintain a healthy weight. Get regular exercise Get regular exercise. This is one of the most important things you can do for your health. Most adults should:  Exercise for at least 150 minutes each week. The exercise should increase your heart rate and make you sweat (moderate-intensity exercise).  Do strengthening exercises at least twice a week. This is in addition to the moderate-intensity exercise.  Spend less time sitting. Even light physical activity can be beneficial. Watch cholesterol and blood lipids Have your blood tested for lipids and cholesterol at 23 years of age, then have this test every 5 years. Have your cholesterol levels checked more often if:  Your lipid or cholesterol levels are high.  You are older than 23 years of age.  You are at high risk for heart disease. What should I know about cancer screening? Depending on your health history and family history, you may need to have cancer screening at various ages. This may include screening for:  Breast cancer.  Cervical cancer.  Colorectal cancer.  Skin cancer.  Lung cancer. What should I know about heart disease, diabetes, and high blood  pressure? Blood pressure and heart disease  High blood pressure causes heart disease and increases the risk of stroke. This is more likely to develop in people who have high blood pressure readings, are of African descent, or are overweight.  Have your blood pressure checked: ? Every 3-5 years if you are 18-39 years of age. ? Every year if you are 40 years old or older. Diabetes Have regular diabetes screenings. This checks your fasting blood sugar level. Have the screening done:  Once every three years after age 40 if you are at a normal weight and have a low risk for diabetes.  More often and at a younger age if you are overweight or have a high risk for diabetes. What should I know about preventing infection? Hepatitis B If you have a higher risk for hepatitis B, you should be screened for this virus. Talk with your health care provider to find out if you are at risk for hepatitis B infection. Hepatitis C Testing is recommended for:  Everyone born from 1945 through 1965.  Anyone with known risk factors for hepatitis C. Sexually transmitted infections (STIs)  Get screened for STIs, including gonorrhea and chlamydia, if: ? You are sexually active and are younger than 24 years of age. ? You are older than 24 years of age and your health care provider tells you that you are at risk for this type of infection. ? Your sexual activity has changed since you were last screened, and you are at increased risk for chlamydia or gonorrhea. Ask your health care provider if   you are at risk.  Ask your health care provider about whether you are at high risk for HIV. Your health care provider may recommend a prescription medicine to help prevent HIV infection. If you choose to take medicine to prevent HIV, you should first get tested for HIV. You should then be tested every 3 months for as long as you are taking the medicine. Pregnancy  If you are about to stop having your period (premenopausal) and  you may become pregnant, seek counseling before you get pregnant.  Take 400 to 800 micrograms (mcg) of folic acid every day if you become pregnant.  Ask for birth control (contraception) if you want to prevent pregnancy. Osteoporosis and menopause Osteoporosis is a disease in which the bones lose minerals and strength with aging. This can result in bone fractures. If you are 65 years old or older, or if you are at risk for osteoporosis and fractures, ask your health care provider if you should:  Be screened for bone loss.  Take a calcium or vitamin D supplement to lower your risk of fractures.  Be given hormone replacement therapy (HRT) to treat symptoms of menopause. Follow these instructions at home: Lifestyle  Do not use any products that contain nicotine or tobacco, such as cigarettes, e-cigarettes, and chewing tobacco. If you need help quitting, ask your health care provider.  Do not use street drugs.  Do not share needles.  Ask your health care provider for help if you need support or information about quitting drugs. Alcohol use  Do not drink alcohol if: ? Your health care provider tells you not to drink. ? You are pregnant, may be pregnant, or are planning to become pregnant.  If you drink alcohol: ? Limit how much you use to 0-1 drink a day. ? Limit intake if you are breastfeeding.  Be aware of how much alcohol is in your drink. In the [place] one drink equals one 12 oz bottle of beer (355 mL), one 5 oz glass of wine (148 mL), or one 1 oz glass of hard liquor (44 mL). General instructions  Schedule regular health, dental, and eye exams.  Stay current with your vaccines.  Tell your health care provider if: ? You often feel depressed. ? You have ever been abused or do not feel safe at home. Summary  Adopting a healthy lifestyle and getting preventive care are important in promoting health and wellness.  Follow your health care provider's instructions about healthy  diet, exercising, and getting tested or screened for diseases.  Follow your health care provider's instructions on monitoring your cholesterol and blood pressure. This information is not intended to replace advice given to you by your health care provider. Make sure you discuss any questions you have with your health care provider. Document Revised: 12/21/2017 Document Reviewed: 12/21/2017 Elsevier Patient Education  2020 Elsevier Inc.  

## 2019-03-23 NOTE — Progress Notes (Signed)
Post Partum Exam  Christy [name] is a 24 y.o. G6P1011 female who presents for a postpartum visit. She is 7 weeks postpartum following a spontaneous vaginal delivery. I have fully reviewed the prenatal and intrapartum course. The delivery was at 38 gestational weeks.  Anesthesia: epidural. Postpartum course has been unremarkab'e. Baby's course has been unremarkable. Baby is feeding by bottle - Similac Advance. Bleeding staining only. Bowel function is normal. Bladder function is normal. Patient is not sexually active. Contraception method is Depo-Provera injections. Postpartum depression screening:neg (score:0)  The following portions of the patient's history were reviewed and updated as appropriate: allergies, current medications, past family history, past medical history, past social history, past surgical history and problem list.  Last pap smear done 11/29/18 and was Normal  Review of Systems Pertinent items noted in HPI and remainder of comprehensive ROS otherwise negative.    Objective:  currently breastfeeding.  General:  alert   Breasts:  not examined  Lungs: clear to auscultation bilaterally  Heart:  regular rate and rhythm, S1, S2 normal, no murmur, click, rub or gallop  Abdomen: soft, non-tender; bowel sounds normal; no masses,  no organomegaly   Vulva:  not evaluated  Vagina: not evaluated  Cervix:  not evaluated  Corpus: not examined  Adnexa:  not evaluated  Rectal Exam: Not performed.        Assessment:    NL postpartum exam. Pap smear not done at today's visit.  Contraceptive management H/O GDM  Plan:   1. Contraception: Depo-Provera injections, Rx to pharmacy, continue with q 12 week injections 2. Rescheduled 2 hr GTT d/t pt not fasting 3. Follow up as noted or as needed.

## 2019-03-28 ENCOUNTER — Other Ambulatory Visit: Payer: Medicaid Other

## 2019-04-05 ENCOUNTER — Inpatient Hospital Stay (HOSPITAL_BASED_OUTPATIENT_CLINIC_OR_DEPARTMENT_OTHER): Admission: RE | Admit: 2019-04-05 | Payer: Medicaid Other | Source: Ambulatory Visit

## 2019-04-06 ENCOUNTER — Other Ambulatory Visit: Payer: Self-pay

## 2019-04-06 ENCOUNTER — Other Ambulatory Visit: Payer: Medicaid Other

## 2019-04-06 DIAGNOSIS — Z8632 Personal history of gestational diabetes: Secondary | ICD-10-CM

## 2019-04-07 LAB — GLUCOSE TOLERANCE, 2 HOURS
Glucose, 2 hour: 64 mg/dL — ABNORMAL LOW (ref 65–139)
Glucose, GTT - Fasting: 85 mg/dL (ref 65–99)

## 2019-05-01 ENCOUNTER — Ambulatory Visit: Payer: Medicaid Other

## 2020-02-18 ENCOUNTER — Telehealth: Payer: Self-pay | Admitting: *Deleted

## 2020-02-18 NOTE — Telephone Encounter (Signed)
Fax from pharmacy for refill request for Ibuprofen 600mg .     Please send refill if approved.

## 2020-05-30 IMAGING — US OBSTETRIC <14 WK US AND TRANSVAGINAL OB US
1 series · 15 of 28 positions shown · non-contrast
Comparison: None.

CLINICAL DATA: Vaginal bleeding, abdominal cramps, positive urine
pregnancy test

EXAM:
OBSTETRIC <14 WK US AND TRANSVAGINAL OB US
TECHNIQUE: Both transabdominal and transvaginal ultrasound examinations were
performed for complete evaluation of the gestation as well as the
maternal uterus, adnexal regions, and pelvic cul-de-sac.
Transvaginal technique was performed to assess early pregnancy.

[Series 1: obstetric <14 wk us and transvaginal ob us · 15 of 53 slices shown]
[im 1/53]
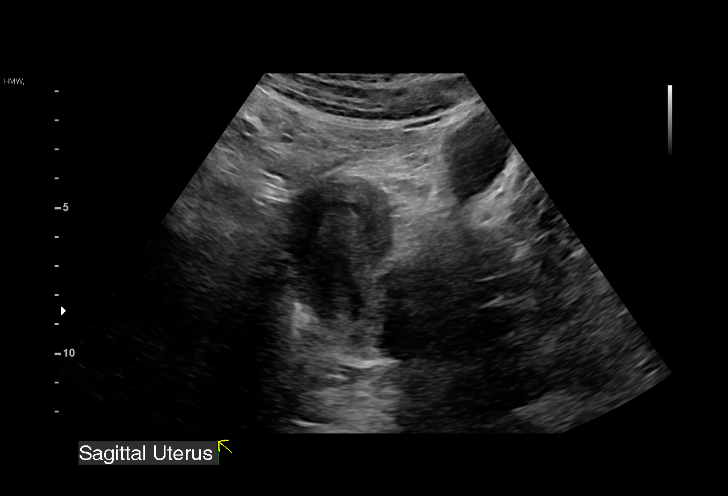
[im 4/53]
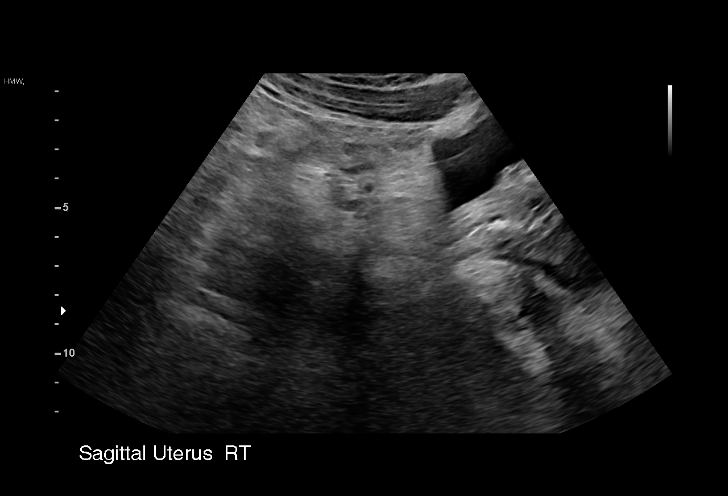
[im 8/53]
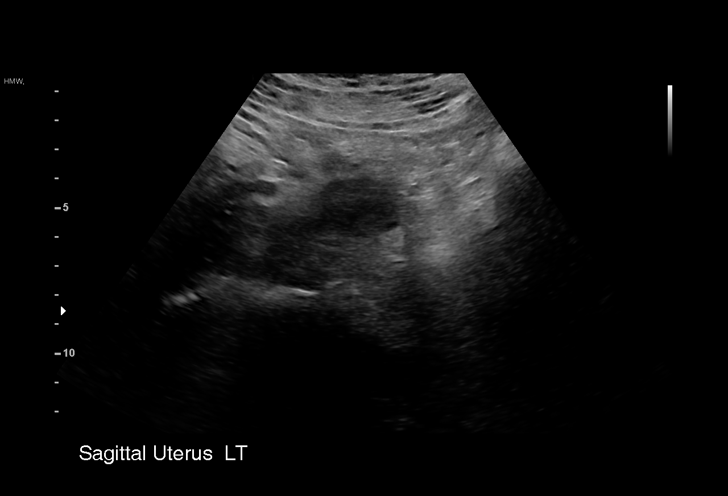
[im 12/53]
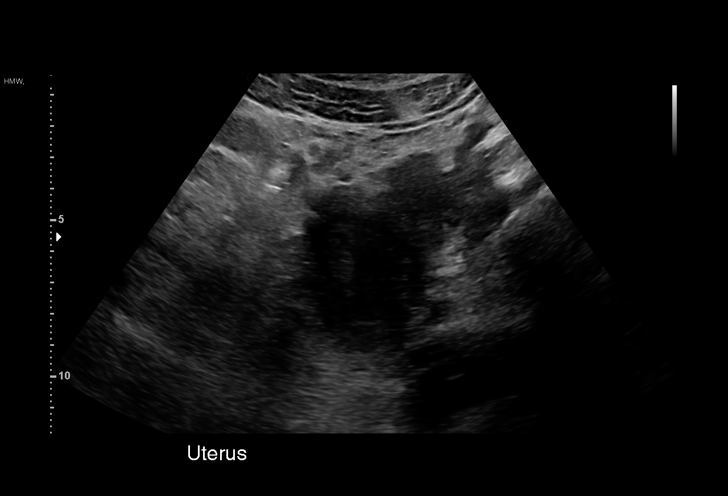
[im 16/53]
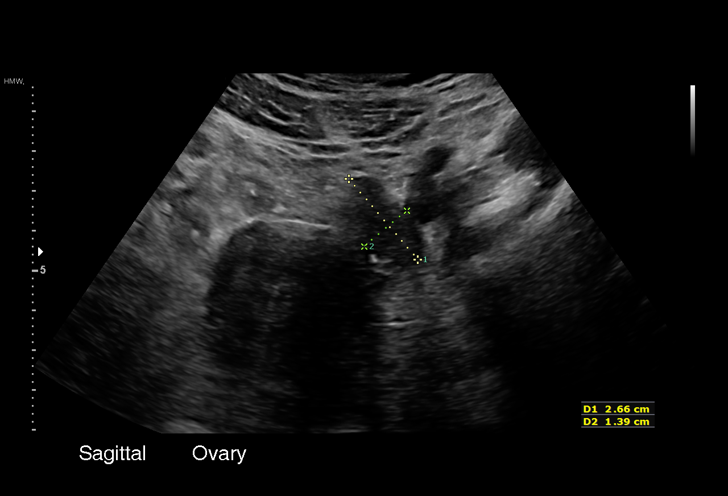
[im 20/53]
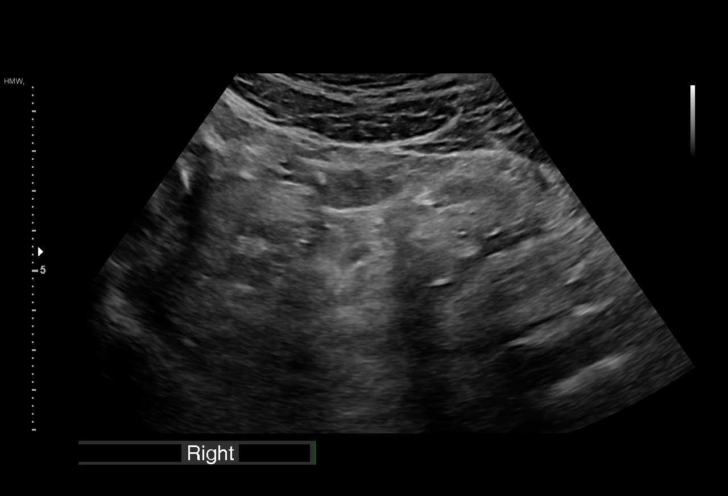
[im 24/53]
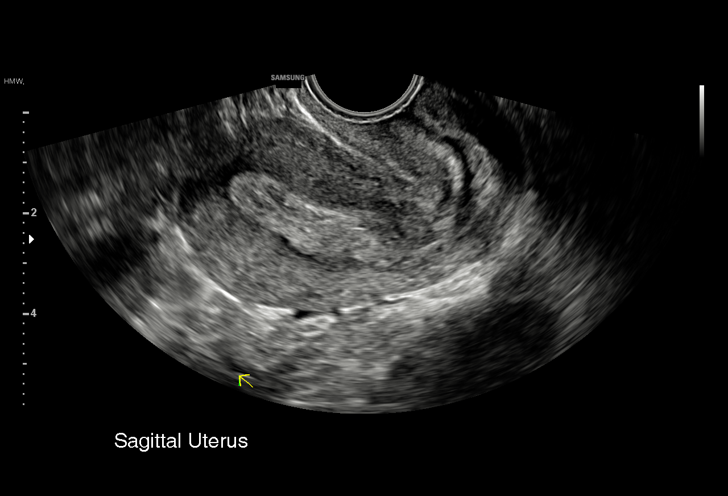
[im 27/53]
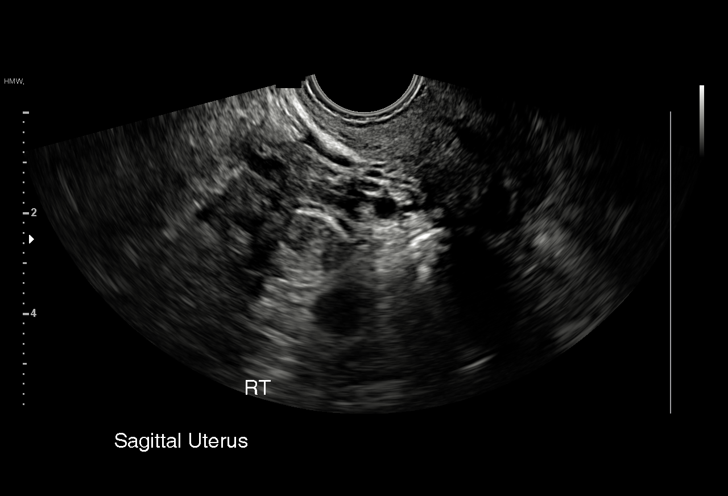
[im 29/53]
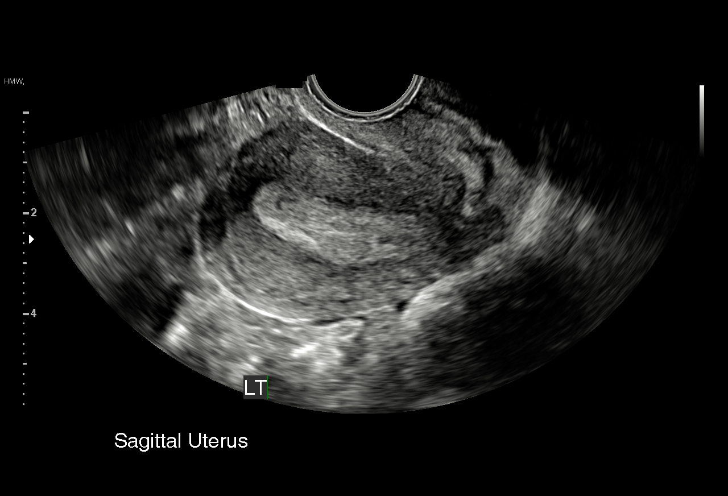
[im 33/53]
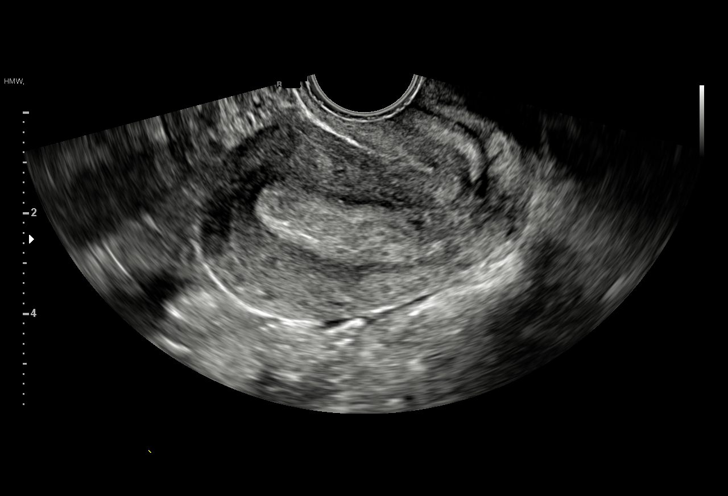
[im 37/53]
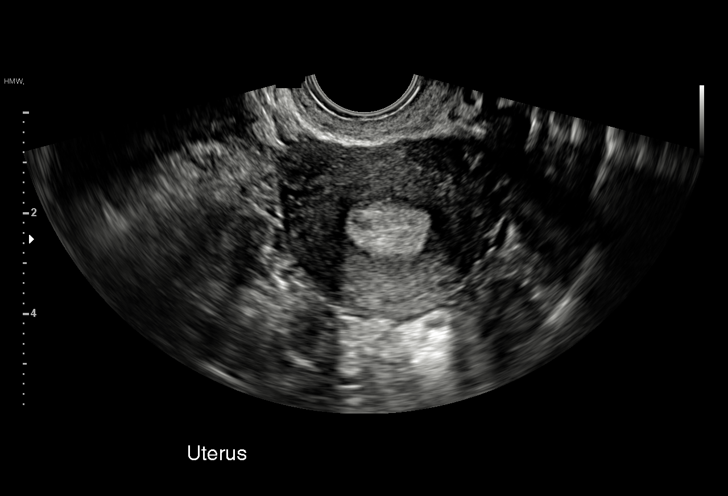
[im 41/53]
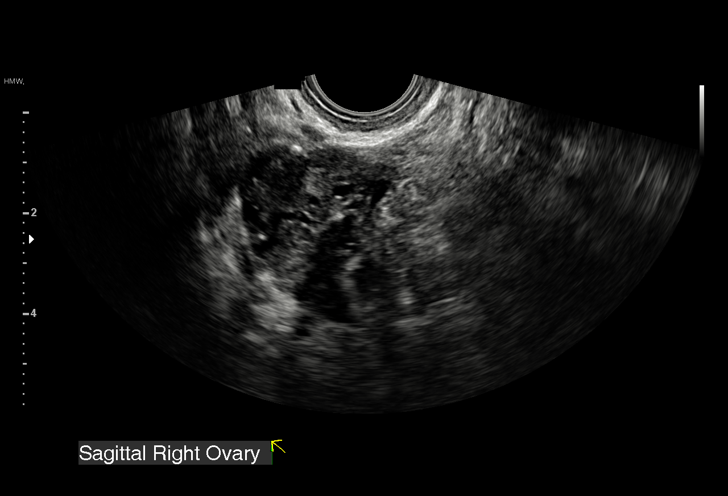
[im 45/53]
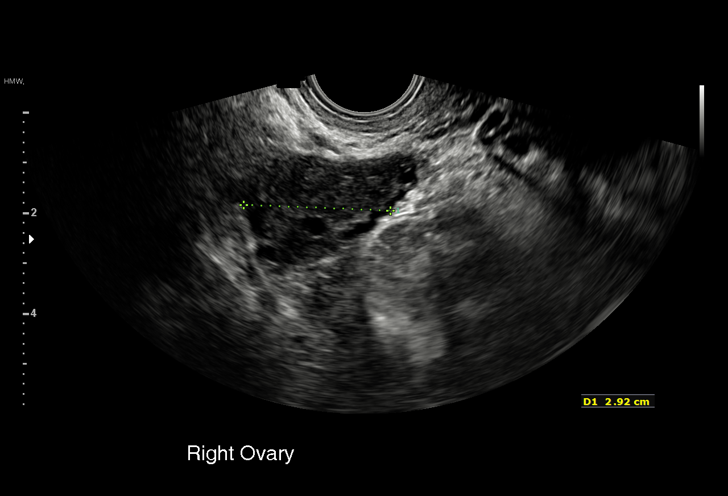
[im 49/53]
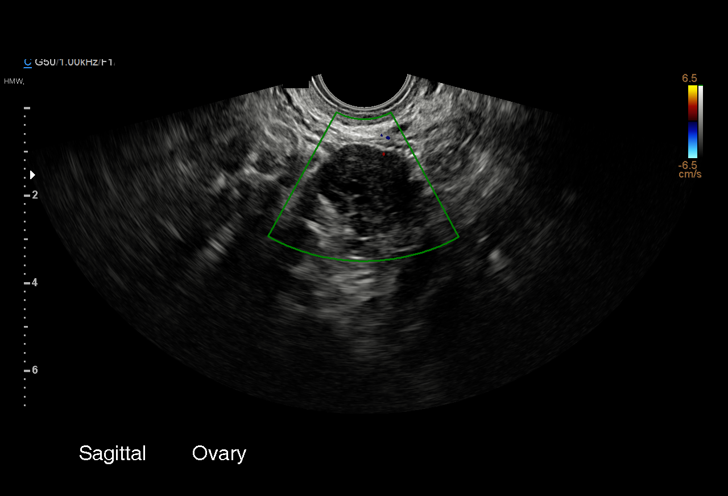
[im 53/53]
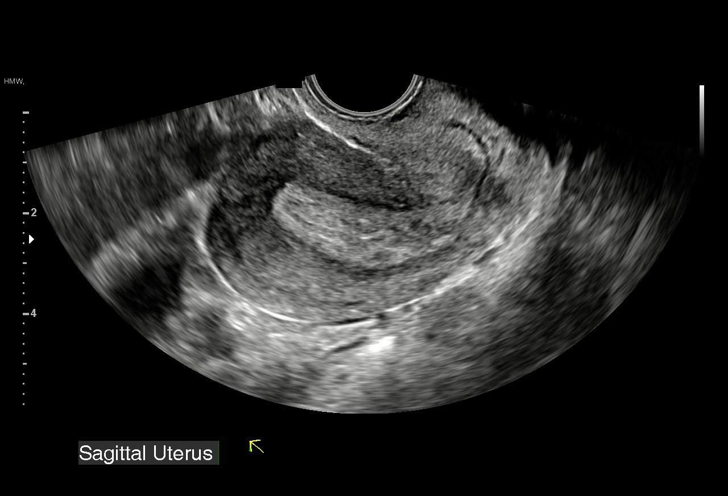

[15 of 28 positions shown; findings below may reference images not displayed]

FINDINGS: Intrauterine gestational sac: None

Subchorionic hemorrhage:  None visualized.

Maternal uterus/adnexae: Endometrial complex measures 12 mm.

Bilateral ovaries are within normal limits.

No free fluid.
IMPRESSION: No IUP is visualized.

By definition, in the setting of a positive pregnancy test, this
reflects a pregnancy of unknown location. Differential
considerations include early normal IUP, abnormal IUP/missed
abortion, or nonvisualized ectopic pregnancy.

Serial beta HCG is suggested. Consider repeat pelvic ultrasound in
14 days, as clinically warranted.

## 2020-07-19 IMAGING — US OBSTETRIC <14 WK US AND TRANSVAGINAL OB US
1 series · 15 of 28 positions shown · non-contrast
Comparison: None.

CLINICAL DATA: Pain

EXAM:
OBSTETRIC <14 WK US AND TRANSVAGINAL OB US
TECHNIQUE: Both transabdominal and transvaginal ultrasound examinations were
performed for complete evaluation of the gestation as well as the
maternal uterus, adnexal regions, and pelvic cul-de-sac.
Transvaginal technique was performed to assess early pregnancy.

[Series 1: obstetric <14 wk us and transvaginal ob us · 15 of 78 slices shown]
[im 1/78]
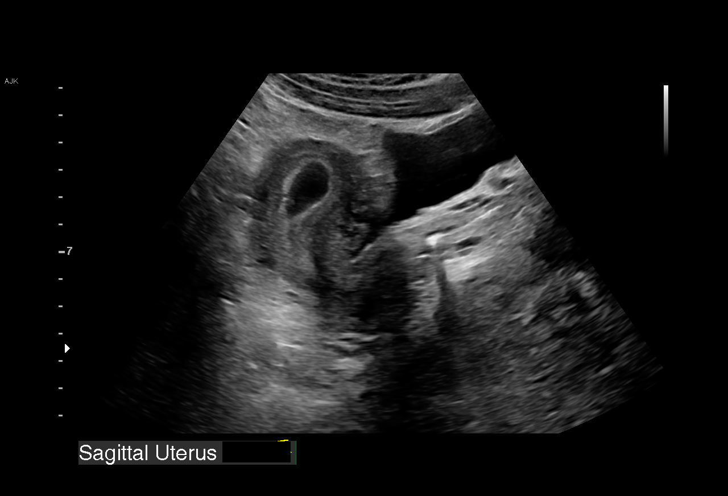
[im 6/78]
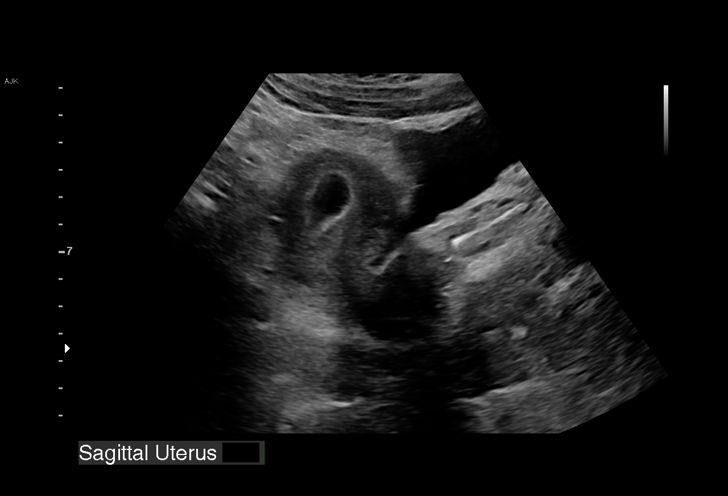
[im 12/78]
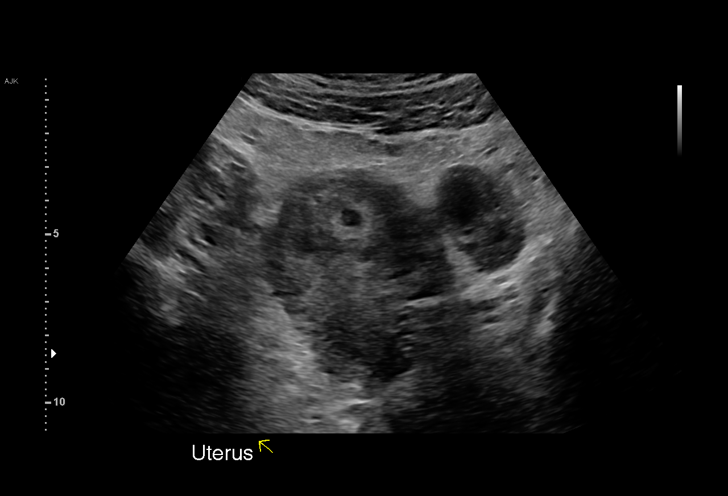
[im 18/78]
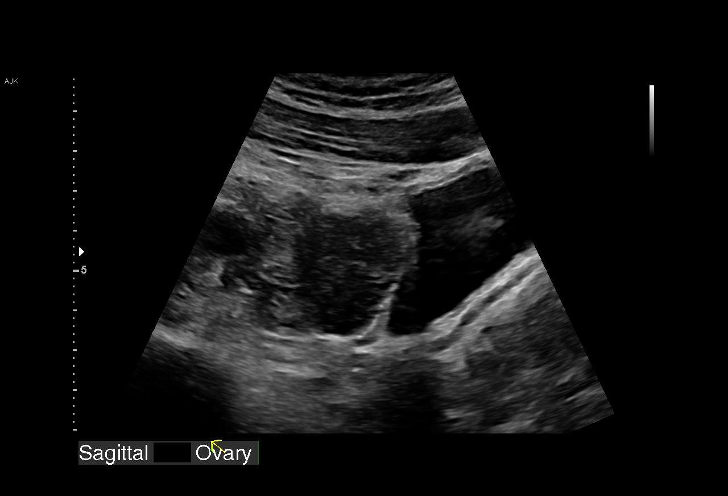
[im 23/78]
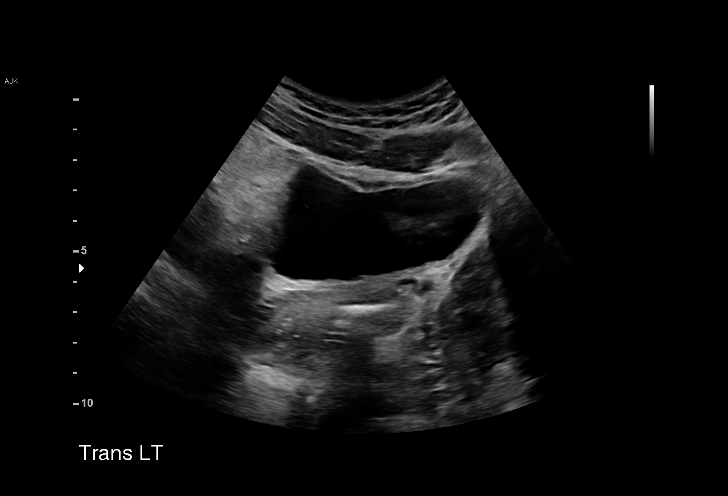
[im 29/78]
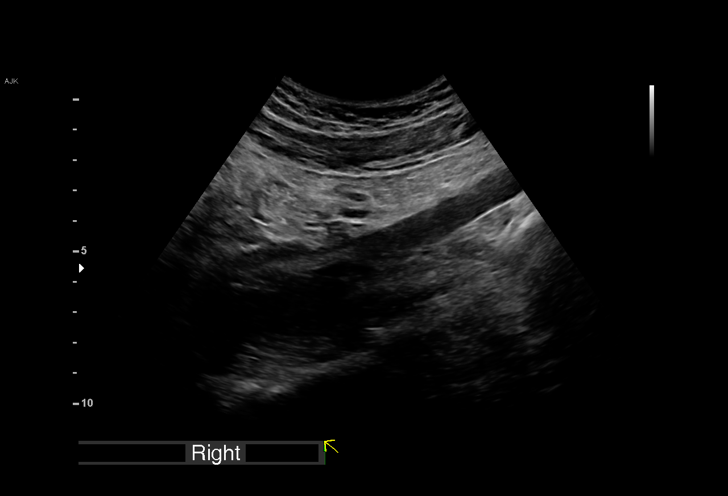
[im 35/78]
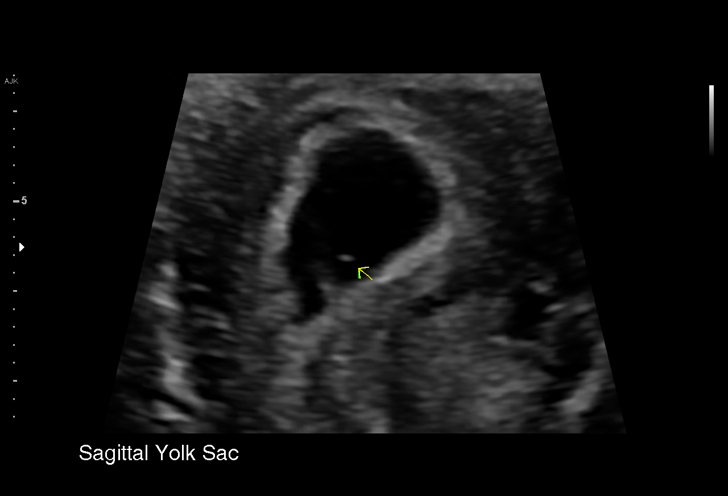
[im 40/78]
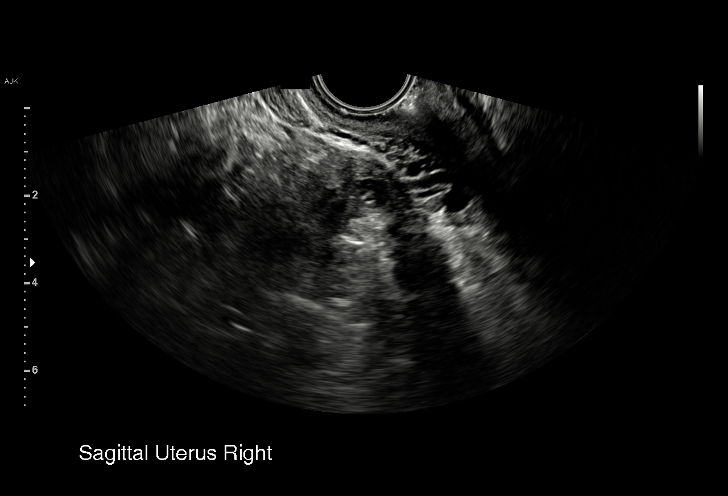
[im 43/78]
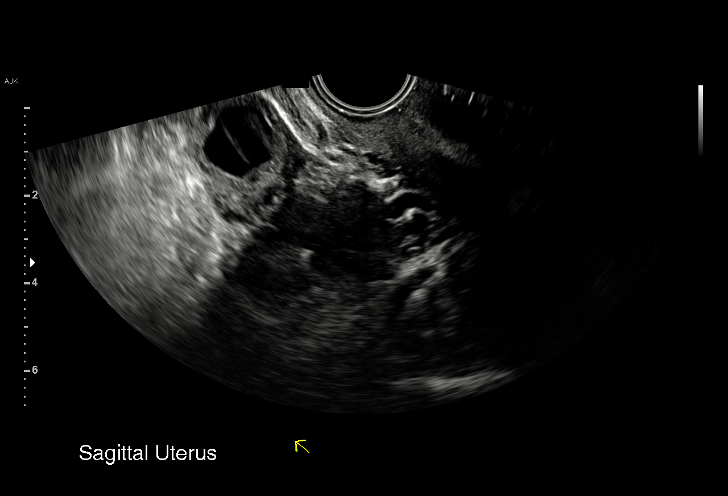
[im 49/78]
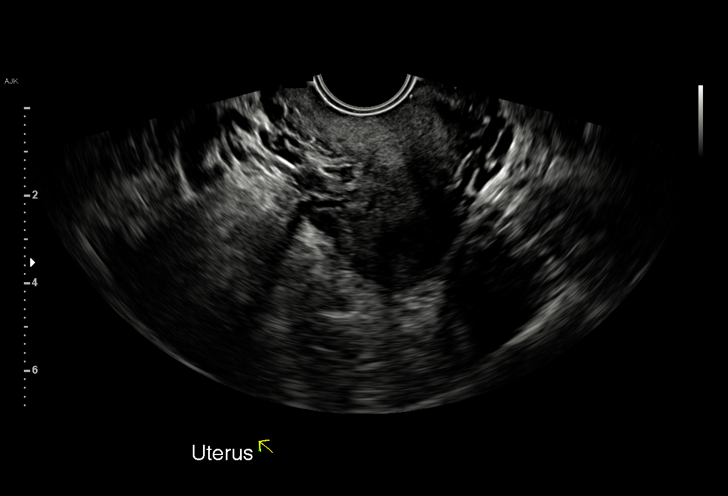
[im 55/78]
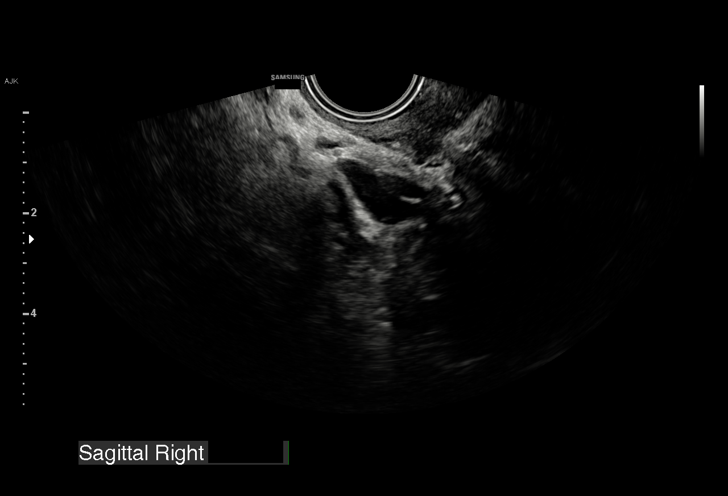
[im 60/78]
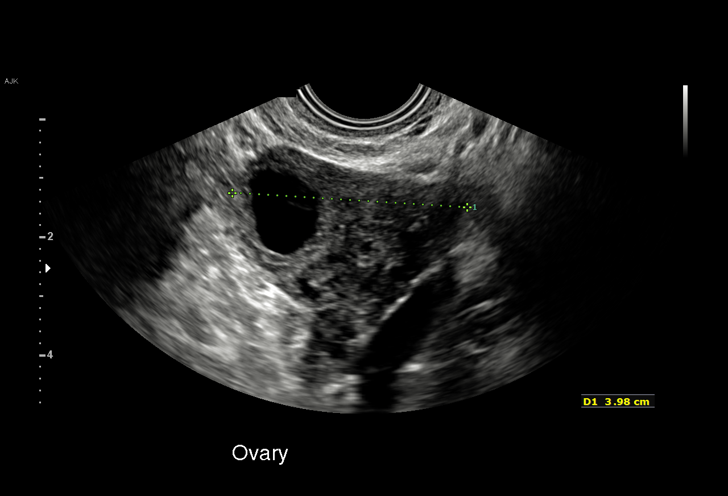
[im 66/78]
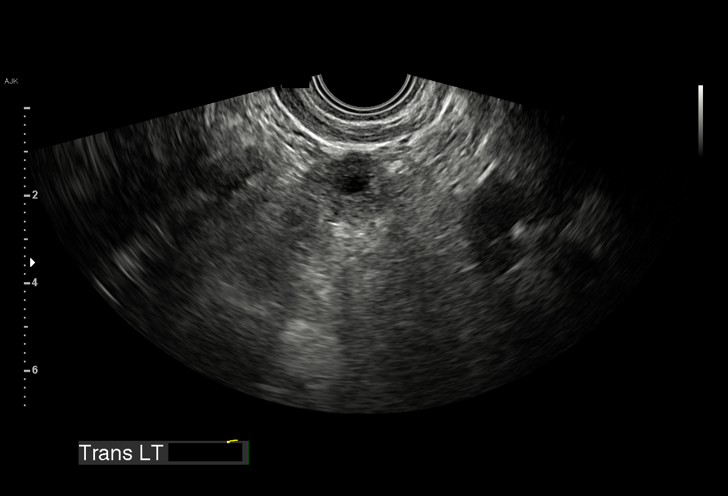
[im 72/78]
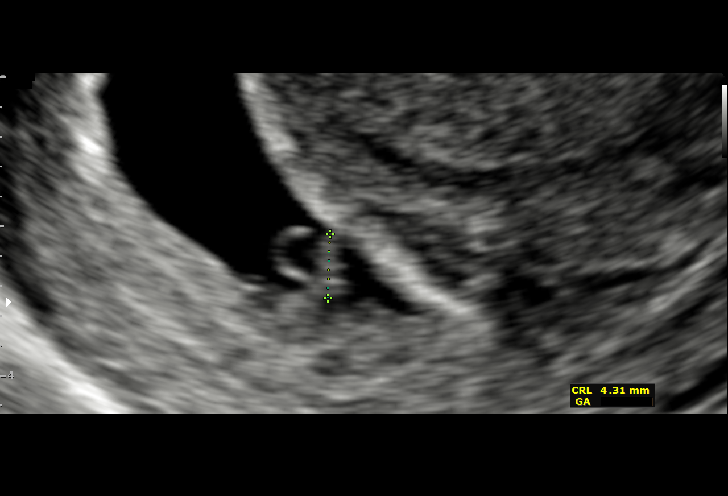
[im 78/78]
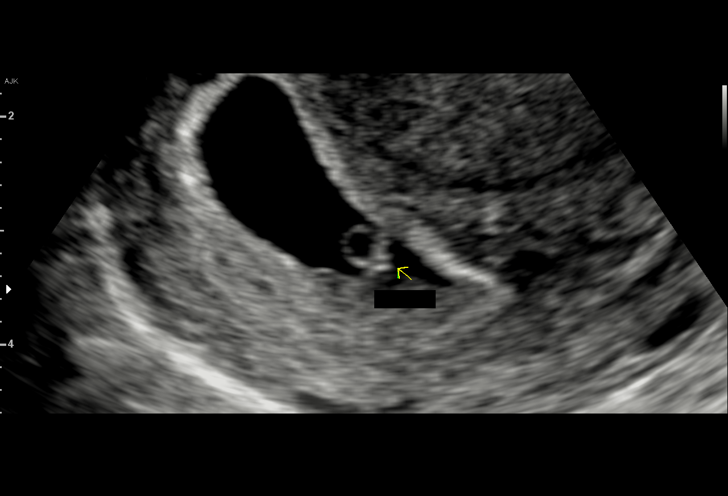

[15 of 28 positions shown; findings below may reference images not displayed]

FINDINGS: Intrauterine gestational sac: Single

Yolk sac:  Visualized.

Embryo:  Visualized.

Cardiac Activity: Visualized.

Heart Rate: 159 bpm

CRL:  4.3 mm   6 w   1 d                  US EDC: 02/07/2019

Subchorionic hemorrhage: There is a small amount of subchorionic
hemorrhage.

Maternal uterus/adnexae: The right ovary measures 3.8 x 2.4 x
cm. The left ovary measures 4.2 x 2.6 x 4.0 cm. There is likely a
corpus luteal cyst within the left ovary.
IMPRESSION: Single live IUP at 6 weeks and 1 day. No acute abnormality detected.

## 2021-02-12 IMAGING — US US MFM OB FOLLOW-UP
1 series · 14 of 28 positions shown · non-contrast
Comparison: none

[Series 1: us mfm ob follow-up · 30 acquisitions, 14 frames shown]
[im 2/30]
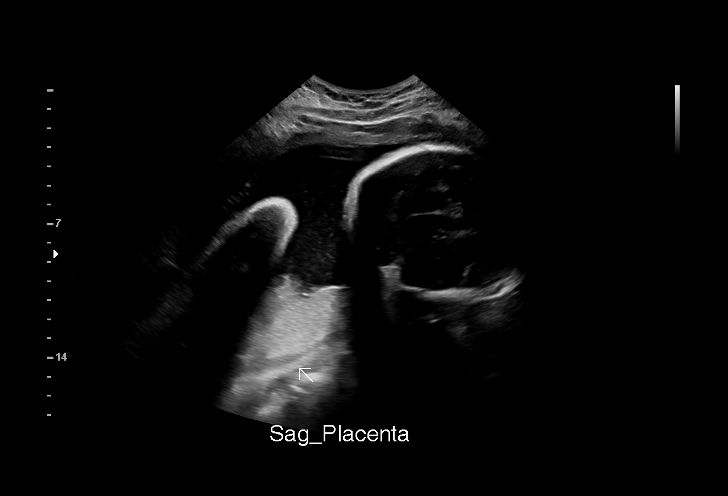
[im 4/30]
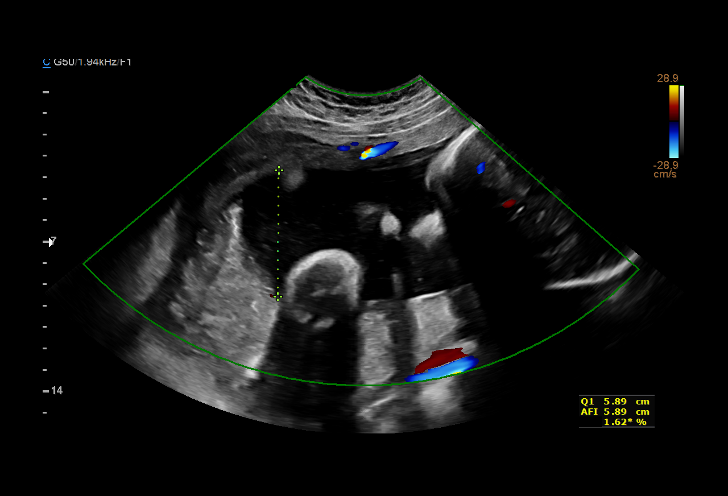
[im 6/30]
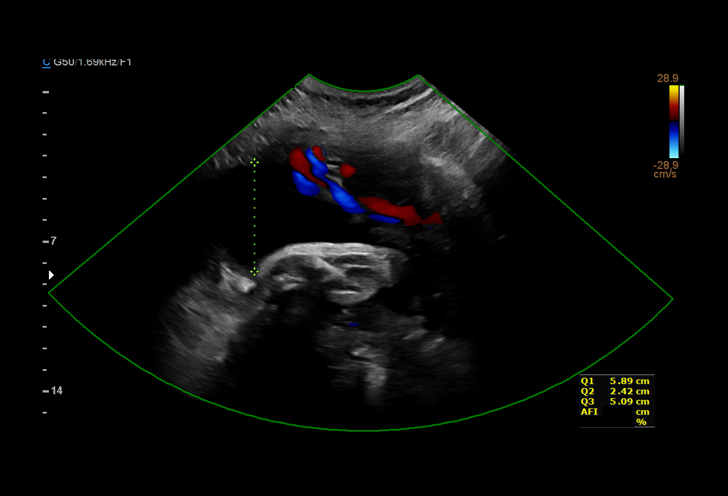
[im 8/30]
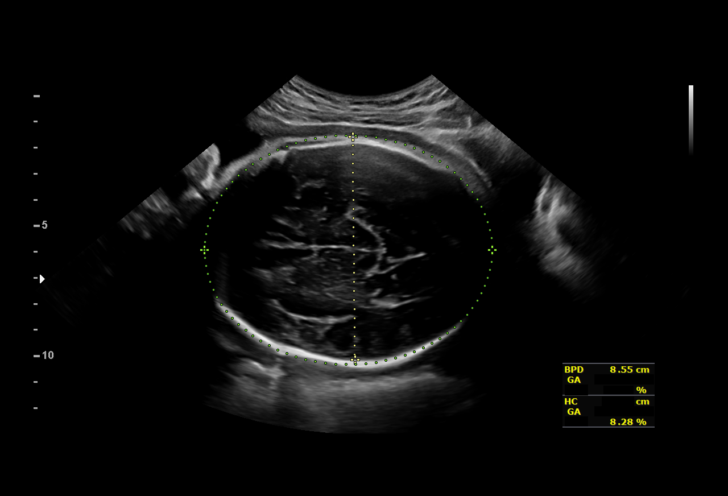
[im 10/30]
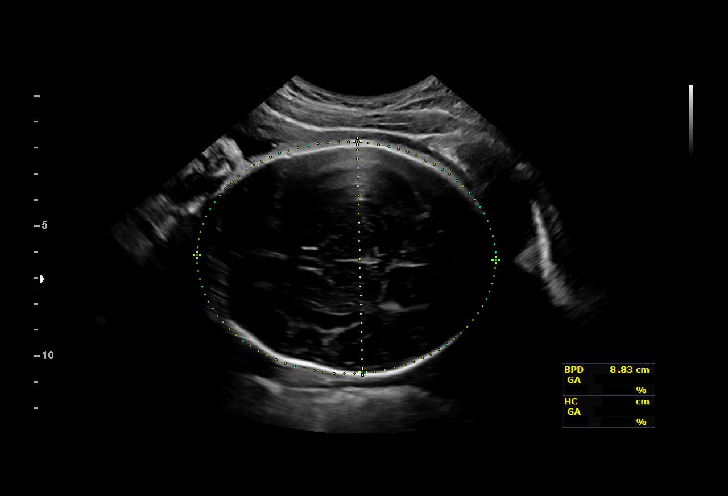
[im 12/30]
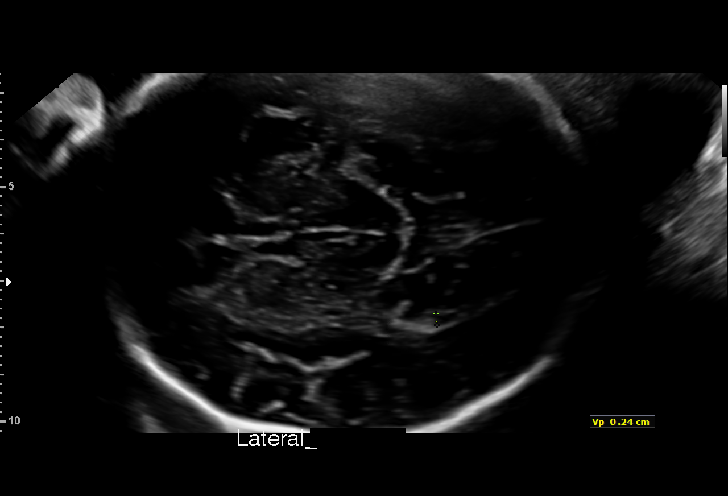
[im 14/30]
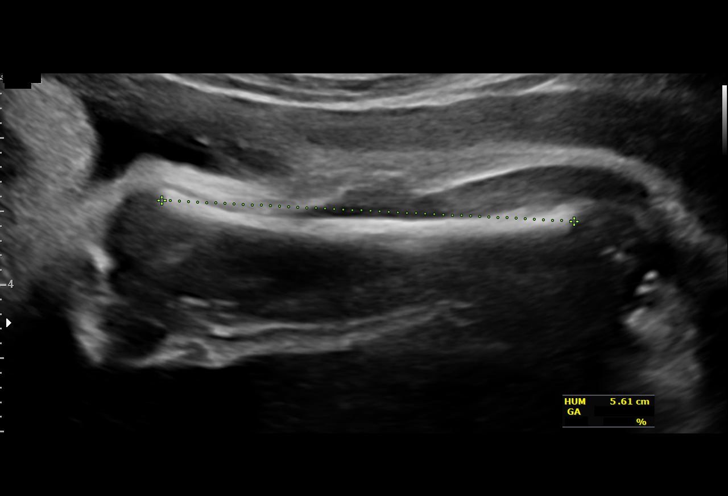
[im 17/30]
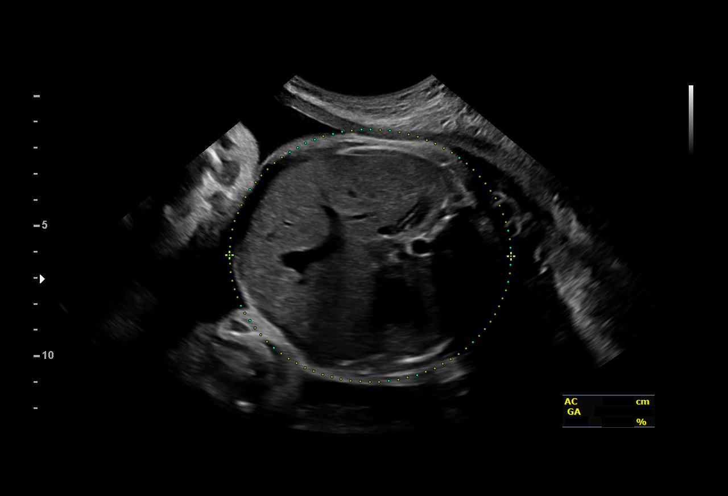
[im 19/30]
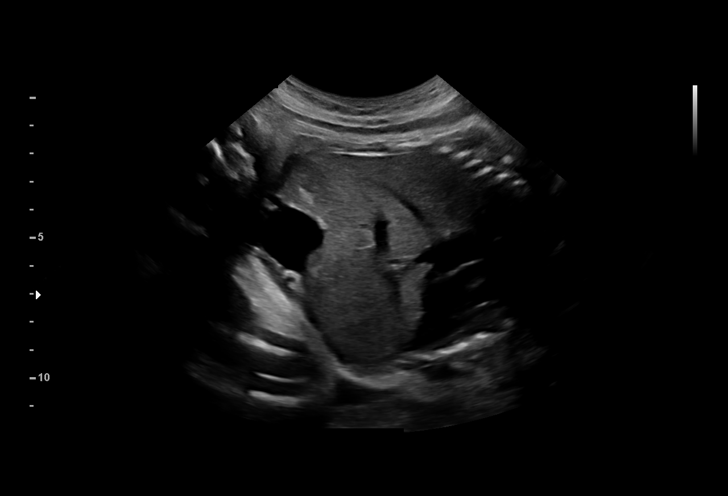
[im 21/30]
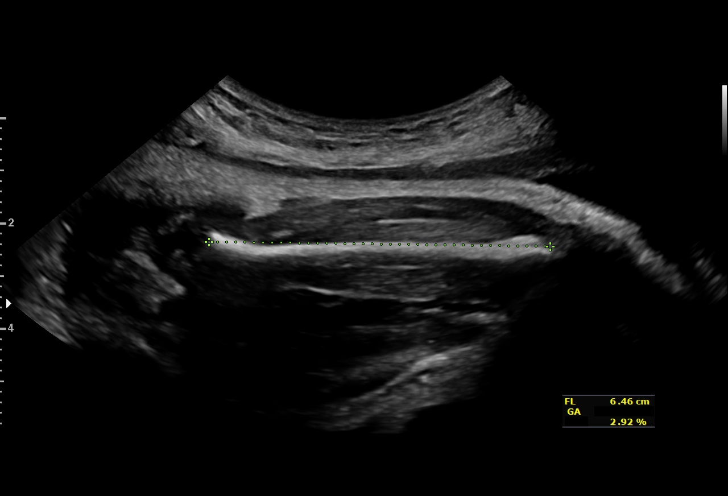
[im 23/30]
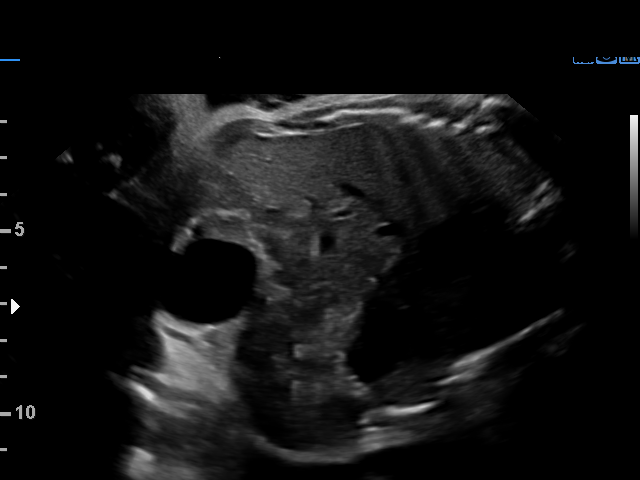
[im 25/30]
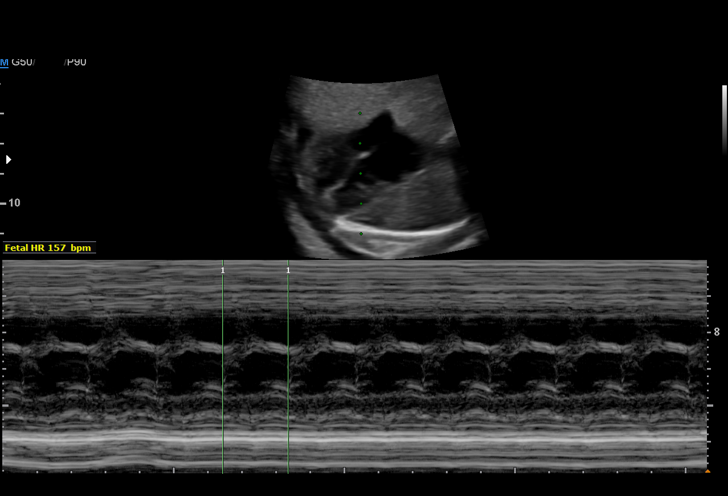
[im 27/30]
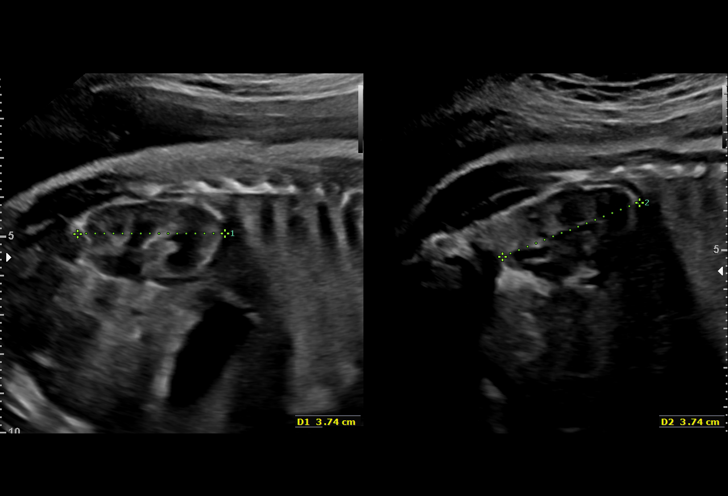
[im 30/30]
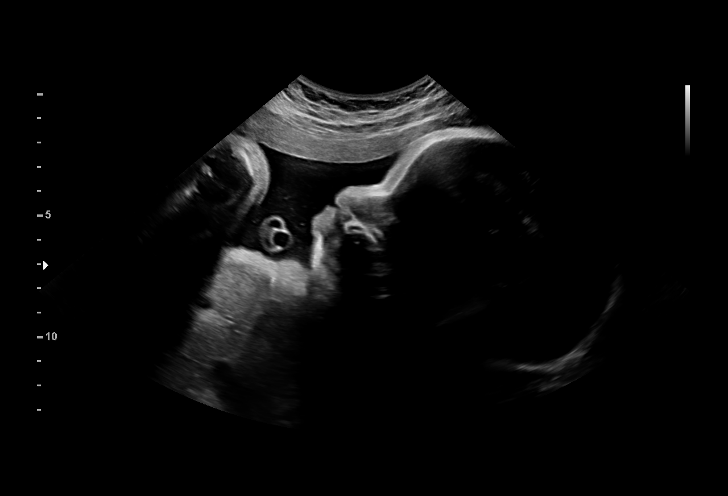

[14 of 28 positions shown; findings below may reference images not displayed]

----------------------------------------------------------------------

 ----------------------------------------------------------------------
Indications

  Encounter for other antenatal screening
  follow-up
  Gestational diabetes in pregnancy, diet
  controlled
  35 weeks gestation of pregnancy
 ----------------------------------------------------------------------
Fetal Evaluation

 Num Of Fetuses:          1
 Fetal Heart Rate(bpm):   157
 Cardiac Activity:        Observed
 Presentation:            Cephalic
 Placenta:                Posterior
 P. Cord Insertion:       Previously Visualized

 Amniotic Fluid
 AFI FV:      Within normal limits

 AFI Sum(cm)     %Tile       Largest Pocket(cm)
 16.82           62

 RUQ(cm)       RLQ(cm)       LUQ(cm)        LLQ(cm)

Biometry

 BPD:      87.1  mm     G. Age:  35w 1d         37  %    CI:        75.36   %    70 - 86
                                                         FL/HC:       21.0  %    20.1 -
 HC:      318.2  mm     G. Age:  35w 6d         19  %    HC/AC:       0.99       0.93 -
 AC:      320.2  mm     G. Age:  36w 0d         62  %    FL/BPD:      76.8  %    71 - 87
 FL:       66.9  mm     G. Age:  34w 3d         13  %    FL/AC:       20.9  %    20 - 24
 HUM:      56.7  mm     G. Age:  33w 0d         13  %
 LV:        2.4  mm

 Est. FW:    9704   gm   5 lb 15 oz      39  %
OB History

 Gravidity:    2         Term:   0        Prem:   0        SAB:   1
 TOP:          0       Ectopic:  0        Living: 0
Gestational Age

 U/S Today:     35w 3d                                        EDD:   02/10/19
 Best:          35w 6d     Det. By:  Early Ultrasound         EDD:   02/07/19
                                     (06/15/18)
Anatomy

 Cranium:               Appears normal         LVOT:                   Previously seen
 Cavum:                 Appears normal         Aortic Arch:            Previously seen
 Ventricles:            Appears normal         Ductal Arch:            Previously seen
 Choroid Plexus:        Previously seen        Diaphragm:              Appears normal
 Cerebellum:            Previously seen        Stomach:                Appears normal, left
                                                                       sided
 Posterior Fossa:       Previously seen        Abdomen:                Appears normal
 Nuchal Fold:           Not applicable (>20    Abdominal Wall:         Previously seen
                        wks GA)
 Face:                  Orbits and profile     Cord Vessels:           Previously seen
                        previously seen
 Lips:                  Previously seen        Kidneys:                Appear normal
 Palate:                Previously seen        Bladder:                Appears normal
 Thoracic:              Appears normal         Spine:                  Previously seen
 Heart:                 Previously seen        Upper Extremities:      Previously seen
 RVOT:                  Previously seen        Lower Extremities:      Previously seen
Impression

 Normal interval growth
 [id number]
 Good amniotic fluid and fetal movement
Recommendations

 Follow up as clinically indcated.

## 2021-04-22 ENCOUNTER — Ambulatory Visit: Payer: Medicaid Other | Admitting: Obstetrics

## 2021-04-23 ENCOUNTER — Ambulatory Visit: Payer: Medicaid Other | Admitting: Obstetrics

## 2021-05-06 ENCOUNTER — Ambulatory Visit: Payer: Medicaid Other | Admitting: Advanced Practice Midwife

## 2021-05-06 NOTE — Progress Notes (Deleted)
   Subjective:     Christy [name] is a 26 y.o. female here at [facility] *** for a routine exam.  Current complaints: ***.  Personal health questionnaire reviewed: {yes/no:9010}.  Do you have a primary care provider? *** Do you feel safe at home? ***    Health Maintenance Due  Topic Date Due   HEMOGLOBIN A1C  Never done   COVID-19 Vaccine (1) Never done   FOOT EXAM  Never done   OPHTHALMOLOGY EXAM  Never done   URINE MICROALBUMIN  Never done   HPV VACCINES (1 - 2-dose series) Never done   Hepatitis C Screening  Never done   CHLAMYDIA SCREENING  01/16/2020     Risk factors for chronic health problems: Smoking: Alchohol/how much: Pt BMI: There is no height or weight on file to calculate BMI.   Gynecologic History No LMP recorded. Contraception: {method:5051} Last Pap: 11/29/2018. Results were: normal Last mammogram: n/a.   Obstetric History OB History  Gravida Para Term Preterm AB Living  2 1 1  0 1 1  SAB IAB Ectopic Multiple Live Births  1 0 0 0 1    # Outcome Date GA Lbr Len/2nd Weight Sex Delivery Anes PTL Lv  2 Term 01/29/19 [redacted]w[redacted]d 12:01 / 00:45 6 lb 2.9 oz (2.804 kg) F Vag-Spont EPI  LIV  1 SAB 04/2018             {Common ambulatory SmartLinks:19316}  Review of Systems {ros; complete:30496}    Objective:   There were no vitals taken for this visit. VS reviewed, nursing note reviewed,  Constitutional: well developed, well nourished, no distress HEENT: normocephalic CV: normal rate Pulm/chest wall: normal effort Breast Exam:  ***Deferred with low risks and shared decision making, discussed recommendation to start mammogram between 40-50 yo/ exam performed: right breast normal without mass, skin or nipple changes or axillary nodes, left breast normal without mass, skin or nipple changes or axillary nodes Abdomen: soft Neuro: alert and oriented x 3 Skin: warm, dry Psych: affect normal Pelvic exam: ***Deferred/ Performed: Cervix pink, visually closed,  without lesion, scant white creamy discharge, vaginal walls and external genitalia normal Bimanual exam: Cervix 0/long/high, firm, anterior, neg CMT, uterus nontender, nonenlarged, adnexa without tenderness, enlargement, or mass       Assessment/Plan:   There are no diagnoses linked to this encounter.     No follow-ups on file.   05/2018, CNM 8:26 AM

## 2022-04-20 ENCOUNTER — Emergency Department (EMERGENCY_DEPARTMENT_HOSPITAL)
Admission: EM | Admit: 2022-04-20 | Discharge: 2022-04-21 | Disposition: A | Discharge status: Other Acute Inpatient Hospital | Payer: Medicaid Other | Source: Home / Self Care | Attending: Emergency Medicine | Admitting: Emergency Medicine

## 2022-04-20 ENCOUNTER — Encounter (HOSPITAL_COMMUNITY): Payer: Self-pay

## 2022-04-20 ENCOUNTER — Other Ambulatory Visit: Payer: Self-pay

## 2022-04-20 DIAGNOSIS — Z1152 Encounter for screening for COVID-19: Secondary | ICD-10-CM | POA: Insufficient documentation

## 2022-04-20 DIAGNOSIS — F1210 Cannabis abuse, uncomplicated: Secondary | ICD-10-CM | POA: Insufficient documentation

## 2022-04-20 DIAGNOSIS — S51812A Laceration without foreign body of left forearm, initial encounter: Secondary | ICD-10-CM | POA: Insufficient documentation

## 2022-04-20 DIAGNOSIS — F32A Depression, unspecified: Secondary | ICD-10-CM

## 2022-04-20 DIAGNOSIS — F23 Brief psychotic disorder: Secondary | ICD-10-CM | POA: Insufficient documentation

## 2022-04-20 DIAGNOSIS — R45851 Suicidal ideations: Secondary | ICD-10-CM | POA: Insufficient documentation

## 2022-04-20 DIAGNOSIS — Z23 Encounter for immunization: Secondary | ICD-10-CM | POA: Insufficient documentation

## 2022-04-20 DIAGNOSIS — X780XXA Intentional self-harm by sharp glass, initial encounter: Secondary | ICD-10-CM | POA: Insufficient documentation

## 2022-04-20 LAB — RAPID URINE DRUG SCREEN, HOSP PERFORMED
Amphetamines: NOT DETECTED
Barbiturates: NOT DETECTED
Benzodiazepines: NOT DETECTED
Cocaine: NOT DETECTED
Opiates: NOT DETECTED
Tetrahydrocannabinol: POSITIVE — AB

## 2022-04-20 LAB — COMPREHENSIVE METABOLIC PANEL
ALT: 12 U/L (ref 0–44)
AST: 15 U/L (ref 15–41)
Albumin: 4.5 g/dL (ref 3.5–5.0)
Alkaline Phosphatase: 35 U/L — ABNORMAL LOW (ref 38–126)
Anion gap: 7 (ref 5–15)
BUN: 5 mg/dL — ABNORMAL LOW (ref 6–20)
CO2: 25 mmol/L (ref 22–32)
Calcium: 8.9 mg/dL (ref 8.9–10.3)
Chloride: 107 mmol/L (ref 98–111)
Creatinine, Ser: 0.8 mg/dL (ref 0.44–1.00)
GFR, Estimated: >60 mL/min (ref >60)
Glucose, Bld: 83 mg/dL (ref 70–99)
Potassium: 3.5 mmol/L (ref 3.5–5.1)
Sodium: 139 mmol/L (ref 135–145)
Total Bilirubin: 0.6 mg/dL (ref 0.3–1.2)
Total Protein: 7.5 g/dL (ref 6.5–8.1)

## 2022-04-20 LAB — CBC WITH DIFFERENTIAL/PLATELET
Abs Immature Granulocytes: 0.02 10*3/uL (ref 0.00–0.07)
Basophils Absolute: 0.1 10*3/uL (ref 0.0–0.1)
Basophils Relative: 1 %
Eosinophils Absolute: 0 10*3/uL (ref 0.0–0.5)
Eosinophils Relative: 0 %
HCT: 38.3 % (ref 36.0–46.0)
Hemoglobin: 13.4 g/dL (ref 12.0–15.0)
Immature Granulocytes: 0 %
Lymphocytes Relative: 21 %
Lymphs Abs: 1.9 10*3/uL (ref 0.7–4.0)
MCH: 31.9 pg (ref 26.0–34.0)
MCHC: 35 g/dL (ref 30.0–36.0)
MCV: 91.2 fL (ref 80.0–100.0)
Monocytes Absolute: 0.5 10*3/uL (ref 0.1–1.0)
Monocytes Relative: 5 %
Neutro Abs: 6.8 10*3/uL (ref 1.7–7.7)
Neutrophils Relative %: 73 %
Platelets: 254 10*3/uL (ref 150–400)
RBC: 4.2 MIL/uL (ref 3.87–5.11)
RDW: 11.6 % (ref 11.5–15.5)
WBC: 9.3 10*3/uL (ref 4.0–10.5)
nRBC: 0 % (ref 0.0–0.2)

## 2022-04-20 LAB — I-STAT BETA HCG BLOOD, ED (MC, WL, AP ONLY): I-stat hCG, quantitative: <5 m[IU]/mL (ref <5)

## 2022-04-20 LAB — SALICYLATE LEVEL: Salicylate Lvl: <7 mg/dL — ABNORMAL LOW (ref 7.0–30.0)

## 2022-04-20 LAB — ACETAMINOPHEN LEVEL: Acetaminophen (Tylenol), Serum: <10 ug/mL — ABNORMAL LOW (ref 10–30)

## 2022-04-20 LAB — ETHANOL: Alcohol, Ethyl (B): <10 mg/dL (ref <10)

## 2022-04-20 MED ORDER — LORAZEPAM 1 MG PO TABS: 1.0000 mg | ORAL_TABLET | ORAL | Status: DC | PRN

## 2022-04-20 MED ORDER — TETANUS-DIPHTH-ACELL PERTUSSIS 5-2.5-18.5 LF-MCG/0.5 IM SUSY
0.5000 mL | PREFILLED_SYRINGE | Freq: Once | INTRAMUSCULAR | Status: AC
  Administered 2022-04-20: 0.5 mL via INTRAMUSCULAR
  Filled 2022-04-20: qty 0.5

## 2022-04-20 NOTE — ED Notes (Signed)
One bag of patient belongings was placed into locker 30.

## 2022-04-20 NOTE — ED Provider Notes (Signed)
Patient does not speak to provider, does not acknowledge writer however when walk in room, she continues to keep head down, staring at floor, arms folded across chest. She chooses to remain nonverbal.  Patient is not willing to partcipate in the assessment and does not make eye contact.  Will have TTS reassess.

## 2022-04-20 NOTE — ED Provider Notes (Signed)
[place] [facility] AT [facility] Provider Note   CSN: [id number] Arrival date & time: 04/20/22  1809     History  Chief Complaint  Patient presents with   Psychiatric Evaluation    Christy [name] is a 27 y.o. female otherwise healthy here presenting with psych eval.  Patient apparently texted mother and mother was very concerned that she sounds aggressive.  Mother came and check on the patient and noticed broken mirrors and broken limbs and collar was not taking care of.  Patient also has some superficial cuts to the arm.  Patient was very tearful when I try to interview her.  She refused to answer any questions.  She did state that she cut herself today.  She has no previous psych history.  The history is provided by the patient.       Home Medications Prior to Admission medications   Medication Sig Start Date End Date Taking? Authorizing Provider  medroxyPROGESTERone (DEPO-PROVERA) 150 MG/ML injection Inject 1 mL (150 mg total) into the muscle every 3 (three) months. 03/23/19   Hermina Staggers, MD  ondansetron (ZOFRAN) 8 MG tablet Take by mouth every 8 (eight) hours as needed for nausea or vomiting.    [provider]  Prenatal Vit-Fe Fumarate-FA (PRENATAL MULTIVITAMIN) TABS tablet Take 1 tablet by mouth daily at 12 noon.    [provider]      Allergies    Penicillins    Review of Systems   Review of Systems  Skin:  Positive for wound.  All other systems reviewed and are negative.   Physical Exam Updated Vital Signs BP (!) 132/93 (BP Location: Right Arm)   Pulse (!) 113   Temp 98.9 F (37.2 C) (Oral)   Resp 16   Wt 66 kg   SpO2 100%   BMI 26.61 kg/m  Physical Exam Vitals and nursing note reviewed.  Constitutional:      Comments: Tearful  HENT:     Head: Normocephalic.     Nose: Nose normal.     Mouth/Throat:     Mouth: Mucous membranes are moist.  Eyes:     Extraocular Movements: Extraocular movements  intact.     Pupils: Pupils are equal, round, and reactive to light.  Cardiovascular:     Rate and Rhythm: Normal rate and regular rhythm.     Pulses: Normal pulses.     Heart sounds: Normal heart sounds.  Pulmonary:     Effort: Pulmonary effort is normal.     Breath sounds: Normal breath sounds.  Abdominal:     General: Abdomen is flat.     Palpations: Abdomen is soft.  Musculoskeletal:     Cervical back: Normal range of motion and neck supple.     Comments: Patient has multiple superficial cuts on the left forearm.  There is no obvious deep lacerations or tendon exposed.  Patient is neurovascularly intact in bilateral upper extremities  Skin:    General: Skin is warm.     Capillary Refill: Capillary refill takes less than 2 seconds.  Neurological:     General: No focal deficit present.     Mental Status: She is oriented to person, place, and time.  Psychiatric:        Mood and Affect: Mood normal.        Behavior: Behavior normal.     ED Results / Procedures / Treatments   Labs (all labs ordered are listed, but only abnormal results  are displayed) Labs Reviewed  CBC WITH DIFFERENTIAL/PLATELET  COMPREHENSIVE METABOLIC PANEL  RAPID URINE DRUG SCREEN, HOSP PERFORMED  ETHANOL  SALICYLATE LEVEL  ACETAMINOPHEN LEVEL  I-STAT BETA HCG BLOOD, ED ([facility], [facility], [facility] ONLY)    EKG None  Radiology No results found.  Procedures Procedures    Medications Ordered in ED Medications - No data to display  ED Course/ Medical Decision Making/ A&P                             Medical Decision Making Christy [name] is a 27 y.o. female here presenting with possible agitation and self cutting behavior.  Patient is tearful and very withdrawn.  Will get psych clearance labs and update tetanus and consult TTS.  8:35 PM Labs unremarkable.  Patient is medically cleared for psych eval  Amount and/or Complexity of Data Reviewed Labs: ordered. Decision-making details documented in ED  Course.  Risk Prescription drug management.   Final Clinical Impression(s) / ED Diagnoses Final diagnoses:  None    Rx / DC Orders ED Discharge Orders     None         Charlynne Pander, MD 04/20/22 2036

## 2022-04-20 NOTE — ED Notes (Signed)
Pt belonging in triage marked pt belongings. Black shirt, pair of [employer] slides, and leggings. Pt give mother her cell phone

## 2022-04-20 NOTE — ED Notes (Signed)
Patient's mother requests updates.

## 2022-04-20 NOTE — ED Triage Notes (Signed)
Pt sent text to mother this am that mother states "sounded aggressive and unsafe for granddaughter" and mother come to check on patient and found broken mirrors and broken lamps, toddler potty for daughter full.  Superficial cuts to arms.  Pt will not speak to triage nurse.

## 2022-04-21 ENCOUNTER — Encounter (HOSPITAL_COMMUNITY): Payer: Self-pay | Admitting: Radiology

## 2022-04-21 ENCOUNTER — Emergency Department (HOSPITAL_COMMUNITY): Payer: Medicaid Other

## 2022-04-21 ENCOUNTER — Encounter (HOSPITAL_COMMUNITY): Payer: Self-pay | Admitting: Psychiatry

## 2022-04-21 ENCOUNTER — Inpatient Hospital Stay (HOSPITAL_COMMUNITY)
Admission: AD | Admit: 2022-04-21 | Discharge: 2022-04-28 | DRG: 885 | Disposition: A | Discharge status: Home / Self Care | Payer: Medicaid Other | Source: Other Acute Inpatient Hospital | Attending: Psychiatry | Admitting: Psychiatry

## 2022-04-21 DIAGNOSIS — R410 Disorientation, unspecified: Secondary | ICD-10-CM | POA: Diagnosis present

## 2022-04-21 DIAGNOSIS — Z1152 Encounter for screening for COVID-19: Secondary | ICD-10-CM | POA: Diagnosis not present

## 2022-04-21 DIAGNOSIS — Z6372 Alcoholism and drug addiction in family: Secondary | ICD-10-CM | POA: Diagnosis not present

## 2022-04-21 DIAGNOSIS — Z560 Unemployment, unspecified: Secondary | ICD-10-CM

## 2022-04-21 DIAGNOSIS — F23 Brief psychotic disorder: Secondary | ICD-10-CM | POA: Diagnosis present

## 2022-04-21 DIAGNOSIS — Z811 Family history of alcohol abuse and dependence: Secondary | ICD-10-CM

## 2022-04-21 DIAGNOSIS — Z23 Encounter for immunization: Secondary | ICD-10-CM | POA: Diagnosis not present

## 2022-04-21 DIAGNOSIS — Z880 Allergy status to penicillin: Secondary | ICD-10-CM

## 2022-04-21 DIAGNOSIS — Z833 Family history of diabetes mellitus: Secondary | ICD-10-CM

## 2022-04-21 DIAGNOSIS — F1210 Cannabis abuse, uncomplicated: Secondary | ICD-10-CM | POA: Diagnosis present

## 2022-04-21 DIAGNOSIS — Z8249 Family history of ischemic heart disease and other diseases of the circulatory system: Secondary | ICD-10-CM

## 2022-04-21 DIAGNOSIS — F1290 Cannabis use, unspecified, uncomplicated: Secondary | ICD-10-CM | POA: Insufficient documentation

## 2022-04-21 DIAGNOSIS — F940 Selective mutism: Secondary | ICD-10-CM | POA: Diagnosis present

## 2022-04-21 LAB — RESP PANEL BY RT-PCR (RSV, FLU A&B, COVID)  RVPGX2
Influenza A by PCR: NEGATIVE
Influenza B by PCR: NEGATIVE
Resp Syncytial Virus by PCR: NEGATIVE
SARS Coronavirus 2 by RT PCR: NEGATIVE

## 2022-04-21 LAB — TSH: TSH: 1.707 u[IU]/mL (ref 0.350–4.500)

## 2022-04-21 LAB — LIPID PANEL
Cholesterol: 180 mg/dL (ref 0–200)
HDL: 42 mg/dL (ref >40)
LDL Cholesterol: 120 mg/dL — ABNORMAL HIGH (ref 0–99)
Total CHOL/HDL Ratio: 4.3 RATIO
Triglycerides: 88 mg/dL (ref <150)
VLDL: 18 mg/dL (ref 0–40)

## 2022-04-21 LAB — HEMOGLOBIN A1C
Hgb A1c MFr Bld: 4.7 % — ABNORMAL LOW (ref 4.8–5.6)
Mean Plasma Glucose: 88.19 mg/dL

## 2022-04-21 MED ORDER — LORAZEPAM 1 MG PO TABS
1.0000 mg | ORAL_TABLET | ORAL | Status: DC
  Filled 2022-04-21: qty 1

## 2022-04-21 MED ORDER — MAGNESIUM HYDROXIDE 400 MG/5ML PO SUSP: 30.0000 mL | Freq: Every day | ORAL | Status: DC | PRN

## 2022-04-21 MED ORDER — PRENATAL MULTIVITAMIN CH
1.0000 | ORAL_TABLET | Freq: Every day | ORAL | Status: DC
  Administered 2022-04-22 – 2022-04-28 (×7): 1 via ORAL
  Filled 2022-04-21 (×8): qty 1

## 2022-04-21 MED ORDER — HYDROXYZINE HCL 25 MG PO TABS
25.0000 mg | ORAL_TABLET | Freq: Three times a day (TID) | ORAL | Status: DC | PRN
  Administered 2022-04-23: 25 mg via ORAL
  Filled 2022-04-21 (×2): qty 1

## 2022-04-21 MED ORDER — HALOPERIDOL LACTATE 5 MG/ML IJ SOLN: 5.0000 mg | Freq: Three times a day (TID) | INTRAMUSCULAR | Status: DC | PRN

## 2022-04-21 MED ORDER — HALOPERIDOL 5 MG PO TABS: 5.0000 mg | ORAL_TABLET | Freq: Three times a day (TID) | ORAL | Status: DC | PRN

## 2022-04-21 MED ORDER — DIPHENHYDRAMINE HCL 50 MG/ML IJ SOLN: 50.0000 mg | Freq: Three times a day (TID) | INTRAMUSCULAR | Status: DC | PRN

## 2022-04-21 MED ORDER — MEDROXYPROGESTERONE ACETATE 150 MG/ML IM SUSP: 150.0000 mg | INTRAMUSCULAR | Status: DC

## 2022-04-21 MED ORDER — ALUM & MAG HYDROXIDE-SIMETH 200-200-20 MG/5ML PO SUSP: 30.0000 mL | ORAL | Status: DC | PRN

## 2022-04-21 MED ORDER — LORAZEPAM 2 MG/ML IJ SOLN: 2.0000 mg | Freq: Three times a day (TID) | INTRAMUSCULAR | Status: DC | PRN

## 2022-04-21 MED ORDER — TRAZODONE HCL 50 MG PO TABS
50.0000 mg | ORAL_TABLET | Freq: Every evening | ORAL | Status: DC | PRN
  Administered 2022-04-24 – 2022-04-26 (×2): 50 mg via ORAL
  Filled 2022-04-21 (×3): qty 1

## 2022-04-21 MED ORDER — DIPHENHYDRAMINE HCL 25 MG PO CAPS: 50.0000 mg | ORAL_CAPSULE | Freq: Three times a day (TID) | ORAL | Status: DC | PRN

## 2022-04-21 MED ORDER — LORAZEPAM 2 MG/ML IJ SOLN: 1.0000 mg | INTRAMUSCULAR | Status: DC

## 2022-04-21 MED ORDER — LORAZEPAM 1 MG PO TABS: 2.0000 mg | ORAL_TABLET | Freq: Three times a day (TID) | ORAL | Status: DC | PRN

## 2022-04-21 MED ORDER — ACETAMINOPHEN 325 MG PO TABS
650.0000 mg | ORAL_TABLET | Freq: Four times a day (QID) | ORAL | Status: DC | PRN
  Administered 2022-04-23 – 2022-04-27 (×2): 650 mg via ORAL
  Filled 2022-04-21 (×3): qty 2

## 2022-04-21 NOTE — ED Notes (Signed)
Pt refusing ativan PO or IM. Pt requesting her clothes. When policy was explained to pt, pt states "get the fuck out of my face". Sitter remains with patient.

## 2022-04-21 NOTE — ED Notes (Addendum)
This Clinical research associate completed a CPS report due to the allegations related to the safety of patients child.   CPS Contact:  Annie Paras  [phone]

## 2022-04-21 NOTE — Consult Note (Addendum)
[facility] ED ASSESSMENT    Reason for Consult: Psych Consult  Referring Physician:  Dr. Silverio Lay Patient Identification: Christy [name] MRN:  [id number] ED Chief Complaint: Psychosis, brief reactive  Diagnosis:  Principal Problem:   Psychosis, brief reactive   ED Assessment Time Calculation: Start Time: 0930 Stop Time: 1000 Total Time in Minutes (Assessment Completion): 30    HPI: Per Triage Note: Pt sent text to mother this am that mother states "sounded aggressive and unsafe for granddaughter" and mother come to check on patient and found broken mirrors and broken lamps, toddler potty for daughter full. Superficial cuts to arms.  Pt will not speak to triage nurse.    Subjective:  Christy [name], 27 y.o., female patient seen face to face by this provider, consulted with Dr. Lucianne Muss; and chart reviewed on 04/21/22.  On evaluation Christy [name] is laying on her left side, with her eyes closed, she is easily awaken when provider walks in the room. Provider is asking patient questions, and patient appears to be having some thought blocking moments, where she wants to speak but unable to verbalize it. Provider ask patient about her daughter and she yells out "my daughter is safe, I just had a moment." Patient then shuts down again, and not engages. Patient denies SI/HI/AVH, by shaking her head no. Patient then says "it was just too much, my daughter is fine, I need to call my mother". Patient then lays back down on her bed, closes her eyes, and will not engage.  During evaluation [name] is sitting up in bed in no acute distress. She is alert, oriented x 3, calm.  Her mood is bizarre with congruent/flat affect. She has normal speech, and bizarre/ irritable behavior. By placing hands over eyes, verbally agitated  Patient appears to be demonstrating some thought blocking behavior, and possibly responding to internal stimuli. She denies suicidal/self-harm/homicidal ideation,  psychosis, and paranoia. Patient is irritable, and she has some superficial cuts to both arms. She has no previous psych history. EK G needed, patient UDS positive for THC.    Spoke with patient mother, Christy [name], and she stated that patient is not currently working, she states that patient sits at home all day. Christy [name] says that she assist patient financially with bills, and groceries. Christy [name] says that she first noticed this behavior in February 2024, daughter acting bizarre, and not engaging with mother, shutting mother out, except she would ask for financial assistance. Christy [name] says this os the worst she has been, and she stated she will do whatever she can to help her daughter, she is also interested in temporary custody of granddaughter, as patient is the only child.     Past Psychiatric History: None  Risk to Self or Others: Is the patient at risk to self? Yes Has the patient been a risk to self in the past 6 months? Yes Has the patient been a risk to self within the distant past? No Is the patient a risk to others? Yes Has the patient been a risk to others in the past 6 months? Yes Has the patient been a risk to others within the distant past? No  [place] Scale:  Flowsheet Row ED from 04/20/2022 in [facility] Emergency Department at [facility]  C-SSRS RISK CATEGORY No Risk       AIMS:  , , ,  ,   ASAM:    Substance Abuse:     Past Medical History:  Past Medical History:  Diagnosis Date   Anemia    Chlamydia 2018, 2019   Gestational diabetes    Gestational hypertension 01/29/2019   Gonorrhea 2018, 2020   Heartburn in pregnancy 11/29/2018   History of maternal chlamydia infection, currently pregnant 11/29/2018   TOC done 11-2018   Infection    UTI   Nausea and vomiting in pregnancy 11/29/2018   Normal labor 01/28/2019   Supervision of other normal pregnancy, antepartum 11/29/2018    Nursing Staff Provider Office Location  [facility] Dating  LMP and 6wk [place]  per patient report Language   Anatomy US  Requesting records Flu Vaccine  11/18 Genetic Screen  NIPS: low risk  AFP:   First Screen:  Quad:   TDaP vaccine   11/18 Hgb A1C or  GTT Early  Third trimester  Rhogam  11/18   LAB RESULTS  Feeding Plan  breast Blood Type --/--/A NEG, A NEG (04/15 1513)  Contraception  Depo Antibody NEG   SVD (spontaneous vaginal delivery) 01/29/2019    Past Surgical History:  Procedure Laterality Date   NO PAST SURGERIES     Family History:  Family History  Problem Relation Age of Onset   Hypertension Mother    Hypertension Father    Hypertension Maternal Grandmother      Social History:  Social History   Substance and Sexual Activity  Alcohol Use No     Social History   Substance and Sexual Activity  Drug Use Not Currently   Types: Marijuana   Comment: last smoked 2 days ago, est 3 x weekly    Social History   Socioeconomic History   Marital status: Single    Spouse name: Not on file   Number of children: Not on file   Years of education: Not on file   Highest education level: Not on file  Occupational History   Not on file  Tobacco Use   Smoking status: Never   Smokeless tobacco: Never  Vaping Use   Vaping Use: Never used  Substance and Sexual Activity   Alcohol use: No   Drug use: Not Currently    Types: Marijuana    Comment: last smoked 2 days ago, est 3 x weekly   Sexual activity: Not Currently    Birth control/protection: None  Other Topics Concern   Not on file  Social History Narrative   Not on file   Social Determinants of Health   Financial Resource Strain: Not on file  Food Insecurity: Not on file  Transportation Needs: Not on file  Physical Activity: Not on file  Stress: Not on file  Social Connections: Not on file    Allergies:   Allergies  Allergen Reactions   Penicillins Hives    DID THE REACTION INVOLVE: Swelling of the face/tongue/throat, SOB, or low BP? Yes  Sudden or severe rash/hives, skin peeling, or  the inside of the mouth or nose? Yes  Did it require medical treatment? No  When did it last happen?      2019  If all above answers are "NO", may proceed with cephalosporin use.  DID THE REACTION INVOLVE: Swelling of the face/tongue/throat, SOB, or low BP? Yes  Sudden or severe rash/hives, skin peeling, or the inside of the mouth or nose? Yes  Did it require medical treatment? No  When did it last happen?      2019  If all above answers are "NO", may proceed with cephalosporin use.  DID THE REACTION  INVOLVE: Swelling of the face/tongue/throat, SOB, or low BP? Yes  Sudden or severe rash/hives, skin peeling, or the inside of the mouth or nose? Yes  Did it require medical treatment? No  When did it last happen?      2019  If all above answers are "NO", may proceed with cephalosporin use.  DID THE REACTION INVOLVE: Swelling of the face/tongue/throat, SOB, or low BP? Yes, Sudden or severe rash/hives, skin peeling, or the inside of the mouth or nose? Yes, Did it require medical treatment? No, When did it last happen?      2019, If all above answers are "NO", may proceed with cephalosporin use.    Labs:  Results for orders placed or performed during the hospital encounter of 04/20/22 (from the past 48 hour(s))  Rapid urine drug screen (hospital performed)     Status: Abnormal   Collection Time: 04/20/22  6:42 PM  Result Value Ref Range   Opiates NONE DETECTED NONE DETECTED   Cocaine NONE DETECTED NONE DETECTED   Benzodiazepines NONE DETECTED NONE DETECTED   Amphetamines NONE DETECTED NONE DETECTED   Tetrahydrocannabinol POSITIVE (A) NONE DETECTED   Barbiturates NONE DETECTED NONE DETECTED    Comment: (NOTE) DRUG SCREEN FOR MEDICAL PURPOSES ONLY.  IF CONFIRMATION IS NEEDED FOR ANY PURPOSE, NOTIFY LAB WITHIN 5 DAYS.  LOWEST DETECTABLE LIMITS FOR URINE DRUG SCREEN Drug Class                     Cutoff (ng/mL) Amphetamine and metabolites    1000 Barbiturate and metabolites     200 Benzodiazepine                 200 Opiates and metabolites        300 Cocaine and metabolites        300 THC                            50 Performed at [facility], [address] [place], [place] [address]   CBC with Differential     Status: None   Collection Time: 04/20/22  6:58 PM  Result Value Ref Range   WBC 9.3 4.0 - 10.5 K/uL   RBC 4.20 3.87 - 5.11 MIL/uL   Hemoglobin 13.4 12.0 - 15.0 g/dL   HCT 60.4 54.0 - 98.1 %   MCV 91.2 80.0 - 100.0 fL   MCH 31.9 26.0 - 34.0 pg   MCHC 35.0 30.0 - 36.0 g/dL   RDW 19.1 47.8 - 29.5 %   Platelets 254 150 - 400 K/uL   nRBC 0.0 0.0 - 0.2 %   Neutrophils Relative % 73 %   Neutro Abs 6.8 1.7 - 7.7 K/uL   Lymphocytes Relative 21 %   Lymphs Abs 1.9 0.7 - 4.0 K/uL   Monocytes Relative 5 %   Monocytes Absolute 0.5 0.1 - 1.0 K/uL   Eosinophils Relative 0 %   Eosinophils Absolute 0.0 0.0 - 0.5 K/uL   Basophils Relative 1 %   Basophils Absolute 0.1 0.0 - 0.1 K/uL   Immature Granulocytes 0 %   Abs Immature Granulocytes 0.02 0.00 - 0.07 K/uL    Comment: Performed at [facility], [address] [place], [place] [address]  Comprehensive metabolic panel     Status: Abnormal   Collection Time: 04/20/22  6:58 PM  Result Value Ref Range   Sodium 139 135 - 145  mmol/L   Potassium 3.5 3.5 - 5.1 mmol/L   Chloride 107 98 - 111 mmol/L   CO2 25 22 - 32 mmol/L   Glucose, Bld 83 70 - 99 mg/dL    Comment: Glucose reference range applies only to samples taken after fasting for at least 8 hours.   BUN 5 (L) 6 - 20 mg/dL   Creatinine, Ser 1.610.80 0.44 - 1.00 mg/dL   Calcium 8.9 8.9 - 09.610.3 mg/dL   Total Protein 7.5 6.5 - 8.1 g/dL   Albumin 4.5 3.5 - 5.0 g/dL   AST 15 15 - 41 U/L   ALT 12 0 - 44 U/L   Alkaline Phosphatase 35 (L) 38 - 126 U/L   Total Bilirubin 0.6 0.3 - 1.2 mg/dL   GFR, Estimated >04>60 >54>60 mL/min    Comment: (NOTE) Calculated using the CKD-EPI Creatinine Equation (2021)    Anion gap 7 5 - 15     Comment: Performed at [facility], [address] [place], [place] [address]  Ethanol     Status: None   Collection Time: 04/20/22  6:58 PM  Result Value Ref Range   Alcohol, Ethyl (B) <10 <10 mg/dL    Comment: (NOTE) Lowest detectable limit for serum alcohol is 10 mg/dL.  For medical purposes only. Performed at [facility], [address] [place], [place] [address]   Salicylate level     Status: Abnormal   Collection Time: 04/20/22  6:58 PM  Result Value Ref Range   Salicylate Lvl <7.0 (L) 7.0 - 30.0 mg/dL    Comment: Performed at [facility], [address] [place], [place] [address]  Acetaminophen level     Status: Abnormal   Collection Time: 04/20/22  6:58 PM  Result Value Ref Range   Acetaminophen (Tylenol), Serum <10 (L) 10 - 30 ug/mL    Comment: (NOTE) Therapeutic concentrations vary significantly. A range of 10-30 ug/mL  may be an effective concentration for many patients. However, some  are best treated at concentrations outside of this range. Acetaminophen concentrations >150 ug/mL at 4 hours after ingestion  and >50 ug/mL at 12 hours after ingestion are often associated with  toxic reactions.  Performed at [facility], [address] [place], [place] [address]   Lipid panel     Status: Abnormal   Collection Time: 04/20/22  6:58 PM  Result Value Ref Range   Cholesterol 180 0 - 200 mg/dL   Triglycerides 88 <578<150 mg/dL   HDL 42 >46>40 mg/dL   Total CHOL/HDL Ratio 4.3 RATIO   VLDL 18 0 - 40 mg/dL   LDL Cholesterol 962120 (H) 0 - 99 mg/dL    Comment:        Total Cholesterol/HDL:CHD Risk Coronary Heart Disease Risk Table                     Men   Women  1/2 Average Risk   3.4   3.3  Average Risk       5.0   4.4  2 X Average Risk   9.6   7.1  3 X Average Risk  23.4   11.0        Use the calculated Patient Ratio above and the CHD Risk Table to determine the patient's CHD Risk.         ATP III CLASSIFICATION (LDL):  <100     mg/dL   Optimal  [id number]  mg/dL  Near or Above                    Optimal  130-159  mg/dL   Borderline  161-096  mg/dL   High  >045     mg/dL   Very High Performed at [facility], [address], [place], [place] [address]   I-Stat beta hCG blood, ED ([facility], [facility], [facility] only)     Status: None   Collection Time: 04/20/22  7:06 PM  Result Value Ref Range   I-stat hCG, quantitative <5.0 <5 mIU/mL   Comment 3            Comment:   GEST. AGE      CONC.  (mIU/mL)   <=1 WEEK        5 - 50     2 WEEKS       50 - 500     3 WEEKS       100 - 10,000     4 WEEKS     1,000 - 30,000        FEMALE AND NON-PREGNANT FEMALE:     LESS THAN 5 mIU/mL   Resp panel by RT-PCR (RSV, Flu A&B, Covid) Anterior Nasal Swab     Status: None   Collection Time: 04/20/22 11:09 PM   Specimen: Anterior Nasal Swab  Result Value Ref Range   SARS Coronavirus 2 by RT PCR NEGATIVE NEGATIVE    Comment: (NOTE) SARS-CoV-2 target nucleic acids are NOT DETECTED.  The SARS-CoV-2 RNA is generally detectable in upper respiratory specimens during the acute phase of infection. The lowest concentration of SARS-CoV-2 viral copies this assay can detect is 138 copies/mL. A negative result does not preclude SARS-Cov-2 infection and should not be used as the sole basis for treatment or other patient management decisions. A negative result may occur with  improper specimen collection/handling, submission of specimen other than nasopharyngeal swab, presence of viral mutation(s) within the areas targeted by this assay, and inadequate number of viral copies(<138 copies/mL). A negative result must be combined with clinical observations, patient history, and epidemiological information. The expected result is Negative.  Fact Sheet for Patients:  BloggerCourse.com  Fact Sheet for Healthcare Providers:  SeriousBroker.it  This test  is no t yet approved or cleared by the [place] FDA and  has been authorized for detection and/or diagnosis of SARS-CoV-2 by FDA under an Emergency Use Authorization (EUA). This EUA will remain  in effect (meaning this test can be used) for the duration of the COVID-19 declaration under Section 564(b)(1) of the Act, 21 U.S.C.section 360bbb-3(b)(1), unless the authorization is terminated  or revoked sooner.       Influenza A by PCR NEGATIVE NEGATIVE   Influenza B by PCR NEGATIVE NEGATIVE    Comment: (NOTE) The Xpert Xpress SARS-CoV-2/FLU/RSV plus assay is intended as an aid in the diagnosis of influenza from Nasopharyngeal swab specimens and should not be used as a sole basis for treatment. Nasal washings and aspirates are unacceptable for Xpert Xpress SARS-CoV-2/FLU/RSV testing.  Fact Sheet for Patients: BloggerCourse.com  Fact Sheet for Healthcare Providers: SeriousBroker.it  This test is not yet approved or cleared by the [place] FDA and has been authorized for detection and/or diagnosis of SARS-CoV-2 by FDA under an Emergency Use Authorization (EUA). This EUA will remain in effect (meaning this test can be used) for the duration of the COVID-19 declaration under Section 564(b)(1) of the Act, 21 U.S.C. section 360bbb-3(b)(1),  unless the authorization is terminated or revoked.     Resp Syncytial Virus by PCR NEGATIVE NEGATIVE    Comment: (NOTE) Fact Sheet for Patients: BloggerCourse.com  Fact Sheet for Healthcare Providers: SeriousBroker.it  This test is not yet approved or cleared by the [place] FDA and has been authorized for detection and/or diagnosis of SARS-CoV-2 by FDA under an Emergency Use Authorization (EUA). This EUA will remain in effect (meaning this test can be used) for the duration of the COVID-19 declaration under Section 564(b)(1) of the  Act, 21 U.S.C. section 360bbb-3(b)(1), unless the authorization is terminated or revoked.  Performed at [facility], [address], [place], [place] [address]     [name] Facility-Administered Medications  Medication Dose Route Frequency Provider Last Rate Last Admin   LORazepam (ATIVAN) tablet 1 mg  1 mg Oral NOW Motley-Mangrum, Alysia Scism A, PMHNP       Or   LORazepam (ATIVAN) injection 1 mg  1 mg Intramuscular NOW Motley-Mangrum, Mendy Chou A, PMHNP       LORazepam (ATIVAN) tablet 1 mg  1 mg Oral Q4H PRN Charlynne Pander, MD       [name] Outpatient Medications  Medication Sig Dispense Refill   medroxyPROGESTERone (DEPO-PROVERA) 150 MG/ML injection Inject 1 mL (150 mg total) into the muscle every 3 (three) months. 1 mL 3   ondansetron (ZOFRAN) 8 MG tablet Take by mouth every 8 (eight) hours as needed for nausea or vomiting.     Prenatal Vit-Fe Fumarate-FA (PRENATAL MULTIVITAMIN) TABS tablet Take 1 tablet by mouth daily at 12 noon.      Musculoskeletal: Strength & Muscle Tone: within normal limits Gait & Station: normal Patient leans: N/A   Psychiatric Specialty Exam: Presentation  General Appearance:  Bizarre  Eye Contact: Poor  Speech: Clear and Coherent  Speech Volume: Normal  Handedness: Right   Mood and Affect  Mood: Irritable; Anxious  Affect: Flat; Inappropriate   Thought Process  Thought Processes: Disorganized  Descriptions of Associations:Loose  Orientation:Partial  Thought Content:Scattered  History of Schizophrenia/Schizoaffective disorder:No data recorded Duration of Psychotic Symptoms:No data recorded Hallucinations:Hallucinations: None  Ideas of Reference:None  Suicidal Thoughts:Suicidal Thoughts: No  Homicidal Thoughts:Homicidal Thoughts: No   Sensorium  Memory: Other (comment) (unable to assess)  Judgment: Poor  Insight: Poor   Executive Functions  Concentration: Fair  Attention  Span: Poor  Recall: Poor  Fund of Knowledge: Poor  Language: Fair   Lexicographer Activity: Psychomotor Activity: Normal   Assets  Assets: Manufacturing systems engineer; Housing; Social Support    Sleep  Sleep: Sleep: Poor   Physical Exam: Physical Exam HENT:     Nose: Nose normal.  Pulmonary:     Effort: Pulmonary effort is normal.  Musculoskeletal:        General: Normal range of motion.     Cervical back: Normal range of motion.  Neurological:     Mental Status: She is alert.  Psychiatric:        Attention and Perception: She is inattentive.        Mood and Affect: Mood is depressed. Affect is flat.        Speech: Speech normal.        Behavior: Behavior is uncooperative and agitated.        Thought Content: Thought content normal.        Cognition and Memory: Cognition is impaired.        Judgment: Judgment is inappropriate.    Review of  Systems  Constitutional: Negative.   Respiratory: Negative.    Genitourinary: Negative.   Psychiatric/Behavioral:  Positive for depression and suicidal ideas.    Blood pressure 129/87, pulse 89, temperature 99.4 F (37.4 C), temperature source Oral, resp. rate 18, weight 66 kg, SpO2 100 %. Body mass index is 26.61 kg/m.   Medical Decision Making: Patient case review and discussed with Dr. Lucianne Muss, and with the assistance of Caryn Bee, patient is placed under IVC at this time due to bizarre behaviors, psychosis, and non-verbal communication. Catatonia is suspected in this patient, she refused the Ativan challenge. Diagnosis brief psychotic disorder; differential diagnosis catatonia however, she is able to verbalize some today, she has refused Ativan, IVC is pending. TTS will continue to monitor. Patient will need inpatient psychiatry admission for crisis stabilization, medication management, and coping skills. Communication with the [facility] has been initiated, bed available this afternoon.        Disposition: Recommend psychiatric Inpatient admission when medically cleared.   Donyel Castagnola MOTLEY-MANGRUM, PMHNP 04/21/2022 10:07 AM

## 2022-04-21 NOTE — Progress Notes (Signed)
Today was pts first day on the unit. Pt was asleep during group discussion

## 2022-04-21 NOTE — ED Notes (Signed)
Pt father, Marshawna Lippitt, [phone], called to check on pt. Pt notified that her father would like a call back. Pt declined at this time.

## 2022-04-21 NOTE — Progress Notes (Signed)
Pt was accepted to [facility] TODAY 04/21/2022, pending IVC paperwork faxed to [phone]. Bed assignment: 504-1  Pt meets inpatient criteria per Alona Bene, PMHNP  Attending Physician will be Phineas Inches, MD  Report can be called to: - Adult unit: [phone]  Pt can arrive after pending items are received; [facility] AC to coordinate arrival time, based on discharge  Care Team Notified: [facility] Rona Ravens, RN, Barrie Dunker, RN, Annie Sable Ward, NT, Caryn Bee, NP, Sherryl Manges, RN, Malva Limes, RN, and [facility], PMHNP  [place], [place]  04/21/2022 11:24 AM

## 2022-04-21 NOTE — ED Notes (Addendum)
Patient is not speaking with staff. She continues to be non verbal the only time she speaks is to tell staff to leave the light on or leave the door open. She is sitting up in bed and has not laid down. Psychiatry attempted to do TTS and patient would not respond.

## 2022-04-21 NOTE — ED Notes (Signed)
Sheriff's officer has arrived to take the patient to [facility]. Belongings sent with officer Andria Meuse.

## 2022-04-21 NOTE — Progress Notes (Signed)
   04/21/22 2100  Psych Admission Type (Psych Patients Only)  Admission Status Involuntary  Psychosocial Assessment  Patient Complaints Suspiciousness  Eye Contact Fair  Facial Expression Flat  Affect Anxious;Preoccupied  Speech Soft  Interaction Guarded;Cautious  Motor Activity Slow  Appearance/Hygiene In scrubs  Behavior Characteristics Cooperative  Mood Anxious;Suspicious  Aggressive Behavior  Effect No apparent injury  Thought Process  Coherency Blocking  Content Preoccupation  Delusions Paranoid  Perception WDL  Hallucination None reported or observed  Judgment Poor  Confusion None  Danger to Self  Current suicidal ideation? Denies  Danger to Others  Danger to Others None reported or observed

## 2022-04-21 NOTE — ED Notes (Signed)
This nurse asked patient if she wanted something to eat. Patient remained non verbal but laughed. Patient cut the lights on and off a couple of times before leaving them off. Patient is in bed laying down but not sleep.

## 2022-04-21 NOTE — Progress Notes (Signed)
This CSW was contacted by Dennison Bulla NP, to "Assist with CPS report." CPS report made. Intake worker reported she will forward report to her supervisor to see if it will be screened in.

## 2022-04-21 NOTE — Tx Team (Signed)
Initial Treatment Plan 04/21/2022 5:09 PM [name] VEZ:[id number]    PATIENT STRESSORS: Financial difficulties   Marital or family conflict   Substance abuse     PATIENT STRENGTHS: Supportive family/friends    PATIENT IDENTIFIED PROBLEMS: Psychosis  Financial difficulties  Marital Family conflict   Medication adjustment                DISCHARGE CRITERIA:  Improved stabilization in mood, thinking, and/or behavior Verbal commitment to aftercare and medication compliance  PRELIMINARY DISCHARGE PLAN: Return to previous living arrangement  PATIENT/FAMILY INVOLVEMENT: This treatment plan has been presented to and reviewed with the patient, Christy [name]. The patient has been given the opportunity to ask questions and make suggestions.  Roseanne Reno, RN 04/21/2022, 5:09 PM

## 2022-04-21 NOTE — BH Assessment (Addendum)
TTS attempted to evaluate pt. Patient did not speak to TTS. Pt avoided eye contact and refused to answer any questions. Pt was seen with her arms folded under her shirt. Patient is not willing to partcipate in the assessment at this time.   TTS provided an update to [employer] via [employer]. Morrell Riddle, RN will inform TTS when pt is willing to participate in assessment.   Morrie Sheldon J,MA,LCMHCA, NCC

## 2022-04-21 NOTE — ED Notes (Signed)
Pt has refused EKG multiple times.

## 2022-04-21 NOTE — Progress Notes (Addendum)
Christy [name], 27 year old female IVC due to bizarre behavior, psychosis, and non-verbal communication suspecting catatonia. Per [facility] ED nurse report, patient's mother stated that she started acting bizarre 2 days ago requesting for her mother to come pick her daughter ([name]) up and making threats against her daughter. Mother had to leave work to check on patient and granddaughter and when arriving patient would not let mother into apartment forcing her to call 911. Mother managed to pick up granddaughter and found glass everywhere with patient appearing agitated/aggressive. Patient had superficial cuts to arms bilaterally and toddlers diaper was full. Patient is currently a single mom, unemployed, and going through financial issues. Her [name] is currently in custody of patient's mom. Ed nurse also reports patient was not verbally communicating suspecting catatonia and refused her ativan earlier today.   Currently, patient presents tearful with flat affect but is minimally verbal. Patient verbalized concerned for her daughter and asked for her family to be contacted. Patient selectively verbal and appeared suspicious and guarded. Some thought blocking noted. Patient does not appear to be responding to internal stimuli and denies SI,HI,and A/V/H with no plan or intent. Patient does not say much but remains cooperative and oriented to unit. UDS positive for THC. CPS contacted earlier in ED due to child safety concern.

## 2022-04-21 NOTE — Consult Note (Addendum)
  Collateral obtained from mother ([name]). She reports that her daughter began acting bizarre about 2 days ago. She initially texted requesting her to come get her daughter. Mother sent her boyfriend over to check on the two, however patient would not let him enter the home. Patient then texted mom around 2pm, "come get this bi**h before I drag her a** out this house. " Mother is confused about the message in which she asked her "who are you talking about?" Patient replied [name] (daughter). Mother at that time left work to go check on them. Upon arrival patient would not let her enter the apartment, she forced the child out the door. She wouldn't provide clothes or shoes at that time. Mother vacated the premises with the granddaughter. Moments after her apartment manager called mother and stated" the neighbor says your daughter doesn't look right. Suspect she maybe laced with something. " Mother asked to call 911 and met them at her daughters apartment complex, apartment had glass everywhere, unkempt, and "mess". Her daughter did present with bizarre behaviors, difficulty speaking, placing hands over eyes, verbally agitated and mild aggression at that time. Mother reports some stressors to include being a single mom (child's father incarcerated), unemployed, adulting, and financial issues. She states her daughter has never presented in such a manner, and she is aware that she has been smoking weed. Mother reports an isolated episode in which she went into [place], left daughter and granddaughter in the car. Upon exiting [employer] daughter was a sleep and granddaughter in the front seat messing with lights. She reports this as strange, as the daughter didn't realize that she was asleep. Mother confirmed that granddaughter ([name]) is in her custody and safe at this time.    Patient is placed under IVC at this time due to bizarre behaviors, psychosis, and non-verbal communication. Petition and IVC completed by this  provider, awaiting notary signature and fax to the magistrate.   -Will obtain CT head to r/o organic causes of psychosis at this time.  -Will obtain EKG at this time.  -Will add on metabolic panel (liipd and A1c)  and TSH (1.707) . All other labs reviewed have been assessed  -Will start Ativan challenge and attempt to complete Loni Beckwith scale to assess for catatonia.  -IVC has been completed, given to Clydie Braun for [employer] and fax to [employer] IVC order will be initiated at this time.  -Recommend TOC referral to file CPS report.   TTS will continue to monitor. Patient will need inpatient psychiatry admission for crisis stabilization, medication management, and coping skills. Communication with the [facility] has been initiated, bed available this afternoon.

## 2022-04-21 NOTE — ED Notes (Signed)
Patient has sat in the same spot and in the same position since 2300.

## 2022-04-22 DIAGNOSIS — F23 Brief psychotic disorder: Secondary | ICD-10-CM

## 2022-04-22 MED ORDER — OLANZAPINE 5 MG PO TBDP
5.0000 mg | ORAL_TABLET | Freq: Two times a day (BID) | ORAL | Status: DC
  Administered 2022-04-22 – 2022-04-23 (×3): 5 mg via ORAL
  Filled 2022-04-22 (×5): qty 1

## 2022-04-22 MED ORDER — OLANZAPINE 10 MG IM SOLR
5.0000 mg | Freq: Two times a day (BID) | INTRAMUSCULAR | Status: DC
  Filled 2022-04-22 (×5): qty 10

## 2022-04-22 MED ORDER — OLANZAPINE 5 MG PO TBDP
5.0000 mg | ORAL_TABLET | Freq: Three times a day (TID) | ORAL | Status: DC | PRN
  Administered 2022-04-26: 5 mg via ORAL

## 2022-04-22 MED ORDER — OLANZAPINE 10 MG IM SOLR: 5.0000 mg | Freq: Three times a day (TID) | INTRAMUSCULAR | Status: DC | PRN

## 2022-04-22 NOTE — Group Note (Signed)
Date:  04/22/2022 Time:  9:41 AM  Group Topic/Focus:  Goals Group:   The focus of this group is to help patients establish daily goals to achieve during treatment and discuss how the patient can incorporate goal setting into their daily lives to aide in recovery. Orientation:   The focus of this group is to educate the patient on the purpose and policies of crisis stabilization and provide a format to answer questions about their admission.  The group details unit policies and expectations of patients while admitted.    Participation Level:  Did Not Attend   Christy [name] 04/22/2022, 9:41 AM  

## 2022-04-22 NOTE — Progress Notes (Signed)
[facility] Second Physician Opinion Progress Note for Medication Administration to Non-consenting Patients (For Involuntarily Committed Patients)  Patient: Christy [name] Date of Birth: [birth year] MRN: [id number]  Reason for the Medication: The patient, without the benefit of the specific treatment measure, is incapable of participating in any available treatment plan that will give the patient a realistic opportunity of improving the patient's condition.  Consideration of Side Effects: Consideration of the side effects related to the medication plan has been given.  Rationale for Medication Administration: The patient is a 27 year old female with no prior psychiatric history who was admitted with bizarre behavior.  According to the records the patient has been very labile, confused and acting in a manner. Family reports that she may have been abusing Marijuana and her UDS was positive for THC. Family felt that there was a significant decline in her mental status. Mother found her in her room with broken mirrors and broken lamps and a toddler potty that was full.  She was unable to take care of herself or others.  She was noted to be mute , disheveled and acting bizarre. She was not taking care of her hygiene and was acting in a manner that could be a danger to self and others .She was IVC to this facility for further treatment. Since admission patient has been quite withdrawn and was unable to provide any coherent information.  Staff notes that she remains suspicious and very delusional.  When seen by this writer the patient was lying in bed.  She was alert and extremely hypervigilant.  She appears disheveled, extremely suspicious.  She was constantly scanning the room with her eyes.  She was unable to answer any questions verbally.  She is not able to acknowledge even with nonverbal gestures.  Her affect is flat.  She seems quite delusional and may be responding to internal stimuli. During her present  condition, the patient is unable to provide any consent for treatment.  She is in need of acute stabilization and antipsychotics like olanzapine are indicated.  It is likely that the patient may refuse oral medications. Present condition it is unlikely she will improve without aggressive treatment of her psychosis.  Initiating nonemergency medication administration will ensure that she will receive either oral or intramuscular injections to expedite improvement and reduce her suffering. I agree with Dr. Massingill/Dr.Chien on the use of olanzapine oral or injectable for initial stabilization.    Rex Kras, MD 04/22/22  1:26 PM   This documentation is good for (7) seven days from the date of the MD signature. New documentation must be completed every seven (7) days with detailed justification in the medical record if the patient requires continued non-emergent administration of psychotropic medications.

## 2022-04-22 NOTE — Group Note (Signed)
Recreation Therapy Group Note   Group Topic:Healthy Decision Making  Group Date: 04/22/2022 Start Time: 1005 End Time: 1045 Facilitators: Antony Sian-McCall, LRT,CTRS Location: [address] Dayroom   Goal Area(s) Addresses:  Patient will effectively work with peer towards shared goal.  Patient will identify factors that guided their decision making.  Patient will pro-socially communicate ideas during group session.   Group Description: Patients were given a scenario that they were going to be stranded on a deserted [place] for several months before being rescued. Writer tasked them with making a list of 15 things they would choose to bring with them for "survival". The list of items was prioritized most important to least. Each patient would come up with their own list, then work together to create a new list of 15 items while in a group of 3-5 peers. LRT discussed each person's list and how it differed from others. The debrief included discussion of priorities, good decisions versus bad decisions, and how it is important to think before acting so we can make the best decision possible. LRT tied the concept of effective communication among group members to patient's support systems outside of the hospital and its benefit post discharge.   Affect/Mood: N/A   Participation Level: Did not attend    Clinical Observations/Individualized Feedback:     Plan: Continue to engage patient in RT group sessions 2-3x/week.   Sophee Mckimmy-McCall, LRT,CTRS  04/22/2022 12:16 PM

## 2022-04-22 NOTE — Progress Notes (Signed)
Pt up staring into space, pt only responds with head nods , pt refused PRN  HS medication this evening, pt encouraged to talk to the doctor in the morning

## 2022-04-22 NOTE — Progress Notes (Signed)
Patient mostly isolative to room and selectively mute with staff and social worker. Patient med compliant with much encouragement and EKG complete. Patient presents flat,guarded, and suspicious. Patient encouraged to shower and provided with hygiene bag and towels. Patient stays mostly to self in room and did not join any groups or recreation. Does not appear to respond to internal stimuli. Patient in no current distress and remains safe on unit.

## 2022-04-22 NOTE — Progress Notes (Signed)
Recreation Therapy Notes  11:11  LRT attempted to complete recreation therapy group session.  Pt would not answer the questions.  Pt would just look at LRT before closing her eyes.  LRT will attempt to complete recreation therapy group session at a later time.      Lewis Grivas-McCall, LRT,CTRS Arly Salminen A Darinda [place]-McCall 04/22/2022 1:15 PM

## 2022-04-22 NOTE — [facility] Suicide Risk Assessment (Signed)
[facility]   Nursing information obtained from:  Patient Demographic factors:  Unemployed, Low socioeconomic status Current Mental Status:  Self-harm behaviors Loss Factors:  Loss of significant relationship, Legal issues, Financial problems / change in socioeconomic status Historical Factors:  Impulsivity Risk Reduction Factors:  Responsible for children under 26 years of age, Sense of responsibility to family, Positive social support  Total Time spent with patient: 1 hour Principal Problem: Brief psychotic disorder Diagnosis:  Principal Problem:   Brief psychotic disorder  Subjective Data:  Christy [name] is a 27 y.o., female with no known past psychiatric history who [facility] for evaluation and management of acute psychosis.    No current outpatient medications    According to outside records[facility] and self-cutting behavior. She was tearful and withdrawn. Per [facility] EDP note "Christy [name] is a 27 y.o. female otherwise healthy here presenting with psych eval.  Patient apparently texted mother and mother was very concerned that she sounds aggressive.  Mother came and check on the patient and noticed broken mirrors and broken limbs and collar was not taking care of.  Patient also has some superficial cuts to the arm.  Patient was very tearful when I try to interview her.  She refused to answer any questions.  She did state that she cut herself today.  She has no previous psych history."    Per TTS note, "On evaluation Christy [name] is laying on her left side, with her eyes closed, she is easily awaken when provider walks in the room. Provider is asking patient questions, and patient appears to be having some thought blocking moments, where she wants to speak but unable to verbalize it. Provider ask patient about her  Christy [name] and she yells out "my Christy [name] is safe, I just had a moment." Patient then shuts down again, and not engages. Patient denies SI/HI/AVH, by shaking her head no. Patient then says "it was just too much, my Christy [name] is fine, I need to call my mother". Patient then lays back down on her bed, closes her eyes, and will not engage. She has normal speech, and bizarre/ irritable behavior. By placing hands over eyes, verbally agitated  Patient appears to be demonstrating some thought blocking behavior, and possibly responding to internal stimuli. She denies  suicidal/self-harm/homicidal ideation, psychosis, and paranoia. Patient is irritable, and she has some superficial cuts to both arms. She has no previous psych history. EKG needed, patient UDS positive for THC. Spoke with patient mother, Christy DareChinita [name], and she stated that patient is not currently working, she states that patient sits at home all day. [name] says that she assist patient financially with bills, and groceries. [name] says that she first noticed this behavior in February 2024, Christy [name] acting bizarre, and not engaging with mother, shutting mother out, except she would ask for financial assistance. [name] says this os the worst she has been, and she stated she will do whatever she can to help her Christy [name], she is also interested in temporary custody of Christy [name], as patient is the only child."  Catatonia was suspected, patient refused ativan challenge. She also refused EKG in ED. CPS [facility] was [facility].     Labs notable for UDS+THC, CBC wnl, CMP wnl, ethanol<10, salicylate <7, tylenol<10, TSH wnl, LDL 120, A1c 4.7, beta hcg<5.    During  admission interview, patient was unresponsive to most questions. Her statements included, " I want my Christy [name]," "Where are my discharge papers?" She stated she has repeated dreams that  she cannot remember, when she "watches something she has flashbacks." Unable to assess for  delusions, hallucinations, or SI. She shook her head when asked about HI. She nodded to getting sleep and shook her head to eating this morning. She was unresponsive when asked about other components of the interview, including past psychiatric and medical history, events that proceeded her admission with eyes constantly looking around the room.    Collateral information obtained from Christy [name]' mother Christy: [phone]    Patient's mom states Christy [name]  "started acting strange in February, something was off." Reports that she was falling asleep throughout the day. One time, she found Christy [name] and her Christy [name] Christy [name] in the car and Christy [name] was passed out asleep. Christy [name] was disoriented and didn't know where she was. Christy states that in March, Christy [name] started shutting her out, especially for the last two weeks, blocking her on social media and messenger. On Monday this week, another family member saw a post that [name] made on [employer] stating "I'm going to get her" and told Christy that "something's wrong with Christy [name]." Patient's mom was concerned about substance use in [name] and asked if she was using pills. Christy [name] got defensive and told her to get her out of the house. She states that Christy [name] has been more "irate" recently. Christy says that her Christy [name] is "pretty normal" at baseline. Christy reports she is unaware of any triggers for Christy [name] recently. She knows that she has been isolated in her apartment for the past month and keeps her curtains closed. She states the apartment manager hasn't seen her in the past month. She states that the baby's father is in jail. She is unaware of any conversations between Christy [name] and the baby's father recently.    Incident on Tuesday that proceeded ED visit. Christy [name] texted her mom at 11PM at night and said that she needed to come and get Christy [name]. Mom didn't see it until 7:30 the next day when she was on her way to work. She asked boyfriend to go check on Vincent and Christy [name]. Shanitha  wouldn't let him in but boyfriend thought she looked OK. Mom texted Dasja back later that morning and asked what was wrong and Tejah responded with "I'm going to hurt that black ass hoe, come get it before I drag somebody out of their place." Christy thinks Yaret was referring to her Christy [name] Christy [name]. When Christy called [facility], Alessi was screaming "Come get it!" repeatedly, so she left work and went to [place]' home. When she got there, she thought "Michalina looked possessed. She was not my Christy [name]." Christy took Christy [name] and went to her own home.  Later, an Print production planner from [place]' apartment complex called and said a neighbor said that [place] did not look like herself, which prompted Christy to call Police and EMS. When they got to the apartment, there were broken pieces of mirror, clothes all over the floor, TV was turned over, kitchen cabinet door open, apartment was a mess. Jamika refused to talk. She would look at her mom with a blank stare. She would grab a pen, take water and slam it down. Mom noticed some cuts on her arm. Mom came back at 9:30 later that night, and Aliveah said "I'm anxious and overwhelmed." She then refused to talk.    Associated Signs/Symptoms:  Depression Symptoms:  unable to assess (Hypo) Manic Symptoms:   unable to assess  Anxiety Symptoms:   unable to assess Psychotic Symptoms:  Delusions, Paranoia, PTSD Symptoms: NA. Mom denies traumatic exposure.    Chart review: No prior psychiatric admissions    Sleep: per nurse patient slept 3.5 hours yesterday Appetite: reportedly ate breakfast this AM   Collateral information: Christy [name] (Patient's mother): [phone] as above    Past Psychiatric History:  Previous Psych Diagnoses: Patient's mom reports none  Prior inpatient treatment: Patient's mom reports none  Current/prior outpatient treatment: Patient's mom reports none  Prior rehab hx: Patient's mom reports none  Psychotherapy hx: Patient's mom reports none   History of suicide: Patient's mom reports none  History of homicide: Patient's mom reports none  Psychiatric medication history: Patient's mom reports none  Psychiatric medication compliance history: not applicable  Neuromodulation history: Patient's mom reports none  Current Psychiatrist: Patient's mom reports none  Current therapist: Patient's mom reports none    Substance Use History: Alcohol: Patient's mom reports none  Tobacoo: Patient's mom reports none  Marijuana: Patient's mom reports she has been using marijuana for years  Cocaine: Patient's mom reports none  Stimulants: Patient's mom reports none  IV drug use: Patient's mom reports none  Opiates: Patient's mom reports none  Prescribed Meds abuse: Patient's mom reports none  H/O withdrawals, blackouts, DTs: Patient's mom reports none  History of Detox / Rehab: Patient's mom reports none  DUI: Patient's mom reports none    Past Medical/Surgical History:  Medical Diagnoses: Patient's mom reports none  Home Rx: Patient's mom reports none  Prior Hosp: Patient's mom reports none  Prior Surgeries/Trauma: Patient's mom reports none  Head trauma, LOC, concussions, seizures: none Allergies: Penicillin- rash, hives, anaphylaxis as a child  LMP: unable to assess  Contraception: Patient's mom reports none  PCP: none noted in chart review    Family Psychiatric History:  Medical: Mother - Hypertension, Maternal and paternal grandparents have hypertension and diabetes  Psych: Patient's mom reports none  Psych Rx: Not applicable  SA/HA: Patient's mom reports none  Substance use family hx: Maternal grandmother smokes marijuana and cocaine, Maternal aunt has alcoholism, Paternal aunt has hx of cocaine use.    Social History:  Childhood: Grew up in [place] until third grade. Moved to [place] in third grade.  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  World Science writer [facility]). Score between 0-7:  no or low risk or alcohol related problems. Score between 8-15:  moderate risk of alcohol related problems. Score between 16-19:  high risk of alcohol related problems. Score 20 or above:  warrants further diagnostic evaluation for alcohol dependence and treatment.  CLINICAL FACTORS:   Currently Psychotic  Musculoskeletal: unable to assess, patient refused Physical Findings: unable to assess, patient refused   Psychiatric Specialty Exam  See H&P  Assets  Assets: Manufacturing systems engineer; Housing; Social Support   Sleep  Sleep: Sleep: Poor    Physical Exam: See H&P  Review of Systems  See H&P  Blood pressure (!) 135/97, pulse (!) 110, temperature 98.9 F (37.2 C), temperature source Oral, resp. rate 20, weight 63.9 kg, SpO2 100 %. Body mass index is 25.75 kg/m.   COGNITIVE FEATURES THAT CONTRIBUTE TO RISK:  Loss of executive function    SUICIDE RISK:   Moderate:  Patient is currently psychotic. Some risk factors present, and identifiable protective factors, including available and accessible social support.  PLAN OF CARE: See H&P  ASSESSMENT: See H&P  I certify that inpatient services furnished can reasonably be expected to improve the patient's condition.   Karie Fetch, MD, PGY-1 04/22/2022, 2:47 PM

## 2022-04-22 NOTE — Progress Notes (Signed)
[facility] LCSW Progress Note   04/22/2022    1:55 PM  Christy [name]   [id number]   Type of Contact and Topic:  PSA attempt #1   CSW attempted to completed PSA with patient, unable to complete at this time. Patietn is currently electively mute. CSW to make additional attempt to complete PSA with patient.    Signed:  Corky Crafts, MSW, LCSW, LCAS 04/22/2022 1:55 PM

## 2022-04-22 NOTE — Group Note (Signed)
Date:  04/22/2022 Time:  9:57 AM  Group Topic/Focus:  Managing Feelings:   The focus of this group is to identify what feelings patients have difficulty handling and develop a plan to handle them in a healthier way upon discharge.    Participation Level:  Did Not Attend   Sonny Dandy Jaylene Schrom 04/22/2022, 9:57 AM

## 2022-04-22 NOTE — Group Note (Signed)
Occupational Therapy Group Note  Group Topic:Coping Skills  Group Date: 04/22/2022 Start Time: 1400 End Time: 1430 Facilitators: Ted Mcalpine, OT   Group Description: Group encouraged increased engagement and participation through discussion and activity focused on "Coping Ahead." Patients were split up into teams and selected a card from a stack of positive coping strategies. Patients were instructed to act out/charade the coping skill for other peers to guess and receive points for their team. Discussion followed with a focus on identifying additional positive coping strategies and patients shared how they were going to cope ahead over the weekend while continuing hospitalization stay.  Therapeutic Goal(s): Identify positive vs negative coping strategies. Identify coping skills to be used during hospitalization vs coping skills outside of hospital/at home Increase participation in therapeutic group environment and promote engagement in treatment   Participation Level: Did not attend   Participation Quality:   Behavior:   Speech/Thought Process:   Affect/Mood:   Insight:   Judgement:   Individualization:   Modes of Intervention:   Patient Response to Interventions:    Plan: Continue to engage patient in OT groups 2 - 3x/week.  04/22/2022  Ted Mcalpine, OT Kerrin Champagne, OT

## 2022-04-22 NOTE — Progress Notes (Signed)
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    04/22/22 2015  Psych Admission Type (Psych Patients Only)  Admission Status Involuntary  Psychosocial Assessment  Patient Complaints Anxiety;Suspiciousness  Eye Contact Fair  Facial Expression Flat  Affect Anxious;Preoccupied  Speech Soft;Elective mutism  Interaction Guarded;Cautious  Motor Activity Slow  Appearance/Hygiene In scrubs  Behavior Characteristics Guarded  Mood Suspicious;Preoccupied  Aggressive Behavior  Effect No apparent injury  Thought Process  Coherency Blocking  Content Preoccupation  Delusions Paranoid  Perception WDL  Hallucination None reported or observed  Judgment Poor  Confusion None  Danger to Self  Current suicidal ideation? Denies  Danger to Others  Danger to Others None reported or observed

## 2022-04-22 NOTE — H&P (Addendum)
Psychiatric Admission Assessment Adult  Patient Identification: Christy [name] MRN:  [id number] Date of Evaluation:  04/22/2022  Chief Complaint:  Brief psychotic disorder [F23],  Brief psychotic disorder  Principal Problem:   Brief psychotic disorder  History of Present Illness:  Christy [name] is a 27 y.o., female with no known past psychiatric history who presents to the [facility] involuntarily from [facility] Emergency Department for evaluation and management of acute psychosis.   No current outpatient medications   According to outside records, the patient presented to [facility] with possible agitation and self-cutting behavior. She was tearful and withdrawn. Per [facility] EDP note "Christy [name] is a 27 y.o. female otherwise healthy here presenting with psych eval.  Patient apparently texted mother and mother was very concerned that she sounds aggressive.  Mother came and check on the patient and noticed broken mirrors and broken limbs and collar was not taking care of.  Patient also has some superficial cuts to the arm.  Patient was very tearful when I try to interview her.  She refused to answer any questions.  She did state that she cut herself today.  She has no previous psych history."    Per TTS note, "On evaluation Christy [name] is laying on her left side, with her eyes closed, she is easily awaken when provider walks in the room. Provider is asking patient questions, and patient appears to be having some thought blocking moments, where she wants to speak but unable to verbalize it. Provider ask patient about her daughter and she yells out "my daughter is safe, I just had a moment." Patient then shuts down again, and not engages. Patient denies SI/HI/AVH, by shaking her head no. Patient then says "it was just too much, my daughter is fine, I need to call my mother". Patient then lays back down on her bed, closes her eyes, and will not engage. She has  normal speech, and bizarre/ irritable behavior. By placing hands over eyes, verbally agitated  Patient appears to be demonstrating some thought blocking behavior, and possibly responding to internal stimuli. She denies  suicidal/self-harm/homicidal ideation, psychosis, and paranoia. Patient is irritable, and she has some superficial cuts to both arms. She has no previous psych history. EKG needed, patient UDS positive for THC. Spoke with patient mother, Christy [name], and she stated that patient is not currently working, she states that patient sits at home all day. [name] says that she assist patient financially with bills, and groceries. [name] says that she first noticed this behavior in February 2024, daughter acting bizarre, and not engaging with mother, shutting mother out, except she would ask for financial assistance. [name] says this os the worst she has been, and she stated she will do whatever she can to help her daughter, she is also interested in temporary custody of granddaughter, as patient is the only child."  Catatonia was suspected, patient refused ativan challenge. She also refused EKG in ED. CPS report was made for her child to [facility].    Labs notable for UDS+THC, CBC wnl, CMP wnl, ethanol<10, salicylate <7, tylenol<10, TSH wnl, LDL 120, A1c 4.7, beta hcg<5.   During admission interview, patient was unresponsive to most questions. Her statements included, " I want my daughter," "Where are my discharge papers?" She stated she has repeated dreams that  she cannot remember, when she "watches something she has flashbacks." Unable to assess for delusions, hallucinations, or SI. She shook her head when  asked about HI. She nodded to getting sleep and shook her head to eating this morning. She was unresponsive when asked about other components of the interview, including past psychiatric and medical history, events that proceeded her admission with eyes constantly looking around the  room.   Collateral information obtained from [name]' mother [name]: [phone]   Patient's mom states Christy [name]  "started acting strange in February, something was off." Reports that she was falling asleep throughout the day. One time, she found Christy [name] and her daughter Christy [name] in the car and [name] was passed out asleep. [name] was disoriented and didn't know where she was. [name] states that in March, Christy [name] started shutting her out, especially for the last two weeks, blocking her on social media and messenger. On Monday this week, another family member saw a post that [name] made on [employer] stating "I'm going to get her" and told [name] that "something's wrong with Christy [name]." Patient's mom was concerned about substance use in [place] and asked if she was using pills. Percy got defensive and told her to get her out of the house. She states that Reneshia has been more "irate" recently. [name] says that her daughter is "pretty normal" at baseline. [name] reports she is unaware of any triggers for Christy [name] recently. She knows that she has been isolated in her apartment for the past month and keeps her curtains closed. She states the apartment manager hasn't seen her in the past month. She states that the baby's father is in jail. She is unaware of any conversations between Christy [name] and the baby's father recently.   Incident on Tuesday that proceeded ED visit. Christy [name] texted her mom at 11PM at night and said that she needed to come and get Christy [name]. Mom didn't see it until 7:30 the next day when she was on her way to work. She asked boyfriend to go check on Christy [name] and Christy [name]. Christy [name] wouldn't let him in but boyfriend thought she looked OK. Mom texted [name] back later that morning and asked what was wrong and [name] responded with "I'm going to hurt that black ass hoe, come get it before I drag somebody out of their place." [name] thinks Angeli was referring to her daughter Christy [name]. When [name] called Francina, Bethea was screaming  "Come get it!" repeatedly, so she left work and went to [place]' home. When she got there, she thought "Kaelynn looked possessed. She was not my daughter." [name] took Christy [name] and went to her own home.  Later, an Print production planner from [place] apartment complex called and said a neighbor said that Potter did not look like herself, which prompted [name] to call Police and EMS. When they got to the apartment, there were broken pieces of mirror, clothes all over the floor, TV was turned over, kitchen cabinet door open, apartment was a mess. Jackelynn refused to talk. She would look at her mom with a blank stare. She would grab a pen, take water and slam it down. Mom noticed some cuts on her arm. Mom came back at 9:30 later that night, and Fran said "I'm anxious and overwhelmed." She then refused to talk.   Associated Signs/Symptoms:  Depression Symptoms:   unable to assess (Hypo) Manic Symptoms:   unable to assess  Anxiety Symptoms:   unable to assess Psychotic Symptoms:  Delusions, Paranoia, PTSD Symptoms: NA. Mom denies traumatic exposure.   Chart review: No prior psychiatric admissions   Sleep: per nurse patient slept 3.5 hours yesterday Appetite: reportedly ate breakfast this AM  Collateral information: [name] (Patient's mother): [phone] as above   Past Psychiatric History:  Previous Psych Diagnoses: Patient's mom reports none  Prior inpatient treatment: Patient's mom reports none  Current/prior outpatient treatment: Patient's mom reports none  Prior rehab hx: Patient's mom reports none  Psychotherapy hx: Patient's mom reports none  History of suicide: Patient's mom reports none  History of homicide: Patient's mom reports none  Psychiatric medication history: Patient's mom reports none  Psychiatric medication compliance history: not applicable  Neuromodulation history: Patient's mom reports none  Current Psychiatrist: Patient's mom reports none  Current therapist: Patient's mom  reports none   Substance Use History: Alcohol: Patient's mom reports none  Tobacoo: Patient's mom reports none  Marijuana: Patient's mom reports she has been using marijuana for years  Cocaine: Patient's mom reports none  Stimulants: Patient's mom reports none  IV drug use: Patient's mom reports none  Opiates: Patient's mom reports none  Prescribed Meds abuse: Patient's mom reports none  H/O withdrawals, blackouts, DTs: Patient's mom reports none  History of Detox / Rehab: Patient's mom reports none  DUI: Patient's mom reports none   Past Medical/Surgical History:  Medical Diagnoses: Patient's mom reports none  Home Rx: Patient's mom reports none  Prior Hosp: Patient's mom reports none  Prior Surgeries/Trauma: Patient's mom reports none  Head trauma, LOC, concussions, seizures: none Allergies: Penicillin- rash, hives, anaphylaxis as a child  LMP: unable to assess  Contraception: Patient's mom reports none  PCP: none noted in chart review   Family Psychiatric History:  Medical: Mother - Hypertension, Maternal and paternal grandparents have hypertension and diabetes  Psych: Patient's mom reports none  Psych Rx: Not applicable  SA/HA: Patient's mom reports none  Substance use family hx: Maternal grandmother smokes marijuana and cocaine, Maternal aunt has alcoholism, Paternal aunt has hx of cocaine use.   Social History:  Childhood: Grew up in [place] until third grade. Moved to [place] in third grade.  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 How often do you have a drink containing alcohol?: Monthly or less 2. How many drinks containing alcohol do you have on a typical day when you are drinking?: 1 or 2 3. How often do you have six or more drinks on one occasion?: Never AUDIT-C Score: 1 4. How often during the last year have you found that you were not able to stop drinking once you had started?: Never 5. How often during the last year have you failed to do what was normally expected from you because of drinking?: Never 6. How often during the last year have you needed a first drink in the morning to get yourself going after a heavy drinking session?: Never 7. How often during the last year have you had a feeling of guilt of remorse after drinking?: Never 8. How often during the last year have you been unable to remember what happened the night before because you had been drinking?: Never 9. Have  you or someone else been injured as a result of your drinking?: No 10. Has a relative or friend or a doctor or another health worker been concerned about your drinking or suggested you cut down?: No Alcohol Use Disorder Identification Test Final Score (AUDIT): 1 Alcohol Brief Interventions/Follow-up: Patient Refused Tobacco Screening:   Tobacco Screening:  Social History   Tobacco Use  Smoking Status Never  Smokeless Tobacco Never      Substance Abuse History in the last 12 months:  Yes.    [place] Scale:  Flowsheet Row Ad[facility] (Current) [facility] from [facility]20/2022 in [facility] Department at [facility]  C-SSRS RISK CATEGORY No Risk No Risk       Social History:  Social History   Substance and Sexual Activity  Alcohol Use No     Social History   Substance and Sexual Activity  Drug Use Not Currently   Types: Marijuana   Comment: last smoked 2 days ago, est 3 x  weekly     Additional Social History:                           Is the patient at risk to self? Yes.    Has the patient been a risk to self in the past 6 months? Yes.    Has the patient been a risk to self within the distant past? No.  Is the patient a risk to others? No.  Has the patient been a risk to others in the past 6 months? No.  Has the patient been a risk to others within the distant past? No.   Allergies:   Allergies  Allergen Reactions   Penicillins Hives    DID THE REACTION INVOLVE: Swelling of the face/tongue/throat, SOB, or low BP? Yes  Sudden or severe rash/hives, skin peeling, or the inside of the mouth or nose? Yes  Did it require medical treatment? No  When did it last happen?      2019  If all above answers are "NO", may proceed with cephalosporin use.  DID THE REACTION INVOLVE: Swelling of the face/tongue/throat, SOB, or low BP? Yes  Sudden or severe rash/hives, skin peeling, or the inside of the mouth or nose? Yes  Did it require medical treatment? No  When did it last happen?      2019  If all above answers are "NO", may proceed with cephalosporin use.  DID THE REACTION INVOLVE: Swelling of the face/tongue/throat, SOB, or low BP? Yes  Sudden or severe rash/hives, skin peeling, or the inside of the mouth or nose? Yes  Did it require medical treatment? No  When did it last happen?      2019  If all above answers are "NO", may proceed with cephalosporin use.  DID THE REACTION INVOLVE: Swelling of the face/tongue/throat, SOB, or low BP? Yes, Sudden or severe rash/hives, skin peeling, or the inside of the mouth or nose? Yes, Did it require medical treatment? No, When did it last happen?      2019, If all above answers are "NO", may proceed with cephalosporin use.    Lab Results:  Results for orders placed or performed during the hospital encounter of 04/20/22 (from the past 48 hour(s))  Rapid urine drug screen (hospital performed)     Status:  Abnormal   Collection Time: 04/20/22  6:42 PM  Result Value Ref Range  Opiates NONE DETECTED NONE DETECTED   Cocaine NONE DETECTED NONE DETECTED   Benzodiazepines NONE DETECTED NONE DETECTED   Amphetamines NONE DETECTED NONE DETECTED   Tetrahydrocannabinol POSITIVE (A) NONE DETECTED   Barbiturates NONE DETECTED NONE DETECTED    Comment: (NOTE) DRUG SCREEN FOR MEDICAL PURPOSES ONLY.  IF CONFIRMATION IS NEEDED FOR ANY PURPOSE, NOTIFY LAB WITHIN 5 DAYS.  LOWEST DETECTABLE LIMITS FOR URINE DRUG SCREEN Drug Class                     Cutoff (ng/mL) Amphetamine and metabolites    1000 Barbiturate and metabolites    200 Benzodiazepine                 200 Opiates and metabolites        300 Cocaine and metabolites        300 THC                            50 Performed at [facility], [address], [place], [place] [address]   CBC with Differential     Status: None   Collection Time: 04/20/22  6:58 PM  Result Value Ref Range   WBC 9.3 4.0 - 10.5 K/uL   RBC 4.20 3.87 - 5.11 MIL/uL   Hemoglobin 13.4 12.0 - 15.0 g/dL   HCT 54.2 70.6 - 23.7 %   MCV 91.2 80.0 - 100.0 fL   MCH 31.9 26.0 - 34.0 pg   MCHC 35.0 30.0 - 36.0 g/dL   RDW 62.8 31.5 - 17.6 %   Platelets 254 150 - 400 K/uL   nRBC 0.0 0.0 - 0.2 %   Neutrophils Relative % 73 %   Neutro Abs 6.8 1.7 - 7.7 K/uL   Lymphocytes Relative 21 %   Lymphs Abs 1.9 0.7 - 4.0 K/uL   Monocytes Relative 5 %   Monocytes Absolute 0.5 0.1 - 1.0 K/uL   Eosinophils Relative 0 %   Eosinophils Absolute 0.0 0.0 - 0.5 K/uL   Basophils Relative 1 %   Basophils Absolute 0.1 0.0 - 0.1 K/uL   Immature Granulocytes 0 %   Abs Immature Granulocytes 0.02 0.00 - 0.07 K/uL    Comment: Performed at [facility], [address], [place], [place] [address]  Comprehensive metabolic panel     Status: Abnormal   Collection Time: 04/20/22  6:58 PM  Result Value Ref Range   Sodium 139 135 - 145 mmol/L   Potassium 3.5 3.5 -  5.1 mmol/L   Chloride 107 98 - 111 mmol/L   CO2 25 22 - 32 mmol/L   Glucose, Bld 83 70 - 99 mg/dL    Comment: Glucose reference range applies only to samples taken after fasting for at least 8 hours.   BUN 5 (L) 6 - 20 mg/dL   Creatinine, Ser 7.10 0.44 - 1.00 mg/dL   Calcium 8.9 8.9 - 62.6 mg/dL   Total Protein 7.5 6.5 - 8.1 g/dL   Albumin 4.5 3.5 - 5.0 g/dL   AST 15 15 - 41 U/L   ALT 12 0 - 44 U/L   Alkaline Phosphatase 35 (L) 38 - 126 U/L   Total Bilirubin 0.6 0.3 - 1.2 mg/dL   GFR, Estimated >94 >85 mL/min    Comment: (NOTE) Calculated using the CKD-EPI Creatinine Equation (2021)    Anion gap 7 5 - 15    Comment: Performed  at [facility], [address] [place], [place] [address]  Ethanol     Status: None   Collection Time: 04/20/22  6:58 PM  Result Value Ref Range   Alcohol, Ethyl (B) <10 <10 mg/dL    Comment: (NOTE) Lowest detectable limit for serum alcohol is 10 mg/dL.  For medical purposes only. Performed at [facility], [address], [place], [place] [address]   Salicylate level     Status: Abnormal   Collection Time: 04/20/22  6:58 PM  Result Value Ref Range   Salicylate Lvl <7.0 (L) 7.0 - 30.0 mg/dL    Comment: Performed at [facility], [address], [place], [place] [address]  Acetaminophen level     Status: Abnormal   Collection Time: 04/20/22  6:58 PM  Result Value Ref Range   Acetaminophen (Tylenol), Serum <10 (L) 10 - 30 ug/mL    Comment: (NOTE) Therapeutic concentrations vary significantly. A range of 10-30 ug/mL  may be an effective concentration for many patients. However, some  are best treated at concentrations outside of this range. Acetaminophen concentrations >150 ug/mL at 4 hours after ingestion  and >50 ug/mL at 12 hours after ingestion are often associated with  toxic reactions.  Performed at [facility], [address] [place], [place] [address]   TSH      Status: None   Collection Time: 04/20/22  6:58 PM  Result Value Ref Range   TSH 1.707 0.350 - 4.500 uIU/mL    Comment: Performed by a 3rd Generation assay with a functional sensitivity of <=0.01 uIU/mL. Performed at [facility], [address] [place], [place] [address]   Lipid panel     Status: Abnormal   Collection Time: 04/20/22  6:58 PM  Result Value Ref Range   Cholesterol 180 0 - 200 mg/dL   Triglycerides 88 <130 mg/dL   HDL 42 >86 mg/dL   Total CHOL/HDL Ratio 4.3 RATIO   VLDL 18 0 - 40 mg/dL   LDL Cholesterol 578 (H) 0 - 99 mg/dL    Comment:        Total Cholesterol/HDL:CHD Risk Coronary Heart Disease Risk Table                     Men   Women  1/2 Average Risk   3.4   3.3  Average Risk       5.0   4.4  2 X Average Risk   9.6   7.1  3 X Average Risk  23.4   11.0        Use the calculated Patient Ratio above and the CHD Risk Table to determine the patient's CHD Risk.        ATP III CLASSIFICATION (LDL):  <100     mg/dL   Optimal  469-629  mg/dL   Near or Above                    Optimal  130-159  mg/dL   Borderline  528-413  mg/dL   High  >244     mg/dL   Very High Performed at [facility], [address] [place], [place] [address]   Hemoglobin A1c     Status: Abnormal   Collection Time: 04/20/22  6:58 PM  Result Value Ref Range   Hgb A1c MFr Bld 4.7 (L) 4.8 - 5.6 %    Comment: (NOTE) Pre diabetes:  5.7%-6.4%  Diabetes:              >6.4%  Glycemic control for   <7.0% adults with diabetes    Mean Plasma Glucose 88.19 mg/dL    Comment: Performed at [facility], [address], [place], [place] [address]  I-Stat beta hCG blood, ED ([facility], [facility], [facility] only)     Status: None   Collection Time: 04/20/22  7:06 PM  Result Value Ref Range   I-stat hCG, quantitative <5.0 <5 mIU/mL   Comment 3            Comment:   GEST. AGE      CONC.  (mIU/mL)   <=1 WEEK        5 - 50     2 WEEKS       50 - 500     3 WEEKS        100 - 10,000     4 WEEKS     1,000 - 30,000        FEMALE AND NON-PREGNANT FEMALE:     LESS THAN 5 mIU/mL   Resp panel by RT-PCR (RSV, Flu A&B, Covid) Anterior Nasal Swab     Status: None   Collection Time: 04/20/22 11:09 PM   Specimen: Anterior Nasal Swab  Result Value Ref Range   SARS Coronavirus 2 by RT PCR NEGATIVE NEGATIVE    Comment: (NOTE) SARS-CoV-2 target nucleic acids are NOT DETECTED.  The SARS-CoV-2 RNA is generally detectable in upper respiratory specimens during the acute phase of infection. The lowest concentration of SARS-CoV-2 viral copies this assay can detect is 138 copies/mL. A negative result does not preclude SARS-Cov-2 infection and should not be used as the sole basis for treatment or other patient management decisions. A negative result may occur with  improper specimen collection/handling, submission of specimen other than nasopharyngeal swab, presence of viral mutation(s) within the areas targeted by this assay, and inadequate number of viral copies(<138 copies/mL). A negative result must be combined with clinical observations, patient history, and epidemiological information. The expected result is Negative.  Fact Sheet for Patients:  BloggerCourse.com  Fact Sheet for Healthcare Providers:  SeriousBroker.it  This test is no t yet approved or cleared by the [place] FDA and  has been authorized for detection and/or diagnosis of SARS-CoV-2 by FDA under an Emergency Use Authorization (EUA). This EUA will remain  in effect (meaning this test can be used) for the duration of the COVID-19 declaration under Section 564(b)(1) of the Act, 21 U.S.C.section 360bbb-3(b)(1), unless the authorization is terminated  or revoked sooner.       Influenza A by PCR NEGATIVE NEGATIVE   Influenza B by PCR NEGATIVE NEGATIVE    Comment: (NOTE) The Xpert Xpress SARS-CoV-2/FLU/RSV plus assay is intended as an aid in  the diagnosis of influenza from Nasopharyngeal swab specimens and should not be used as a sole basis for treatment. Nasal washings and aspirates are unacceptable for Xpert Xpress SARS-CoV-2/FLU/RSV testing.  Fact Sheet for Patients: BloggerCourse.com  Fact Sheet for Healthcare Providers: SeriousBroker.it  This test is not yet approved or cleared by the [place] FDA and has been authorized for detection and/or diagnosis of SARS-CoV-2 by FDA under an Emergency Use Authorization (EUA). This EUA will remain in effect (meaning this test can be used) for the duration of the COVID-19 declaration under Section 564(b)(1) of the Act, 21 U.S.C. section 360bbb-3(b)(1), unless the authorization is terminated or revoked.  Resp Syncytial Virus by PCR NEGATIVE NEGATIVE    Comment: (NOTE) Fact Sheet for Patients: BloggerCourse.com  Fact Sheet for Healthcare Providers: SeriousBroker.it  This test is not yet approved or cleared by the [place] FDA and has been authorized for detection and/or diagnosis of SARS-CoV-2 by FDA under an Emergency Use Authorization (EUA). This EUA will remain in effect (meaning this test can be used) for the duration of the COVID-19 declaration under Section 564(b)(1) of the Act, 21 U.S.C. section 360bbb-3(b)(1), unless the authorization is terminated or revoked.  Performed at [facility] [address] [place], [place] [address]     Blood Alcohol level:  Lab Results  Component Value Date   ETH <10 04/20/2022    Metabolic Disorder Labs:  Lab Results  Component Value Date   HGBA1C 4.7 (L) 04/20/2022   MPG 88.19 04/20/2022   No results found for: "PROLACTIN" Lab Results  Component Value Date   CHOL 180 04/20/2022   TRIG 88 04/20/2022   HDL 42 04/20/2022   CHOLHDL 4.3 04/20/2022   VLDL 18 04/20/2022   LDLCALC 120 (H)  04/20/2022    Current Medications:  prenatal multivitamin  1 tablet Oral Q1200    acetaminophen, alum & mag hydroxide-simeth, diphenhydrAMINE **OR** diphenhydrAMINE, haloperidol **OR** haloperidol lactate, hydrOXYzine, LORazepam **OR** LORazepam, magnesium hydroxide, traZODone   PTA Medications: No medications prior to admission.   Sleep:Sleep: Poor   Musculoskeletal: unable to assess, patient refused Physical Findings: unable to assess, patient refused  Psychiatric Specialty Exam:  General Appearance: appears older than stated age, fairly dressed  Behavior: guarded, suspicious  Psychomotor Activity:No psychomotor agitation or retardation noted   Eye Contact: limited, eyes looking around the room Speech: decreased amount, tone, volume and latency   Mood: "I'm fine" Affect: flat   Thought Process: unable to assess  Descriptions of Associations: loose Thought Content: Did not answer to AVH, paranoia, ideas of reference, first rank symptoms. Appears to be grossly responding to internal/external stimuli on exam  Hallucinations: Did not answer but appears to be responding to internal stimuli on exam.  Delusions: Denies Paranoia but appears to be responding to internal stimuli, constantly looking around room.  Suicidal Thoughts: Did not answer  Homicidal Thoughts: Denies HI, intention, plan   Alertness/Orientation: unable to assess   Insight: poor Judgment: poor  Memory: unable to assess   Executive Functions  Concentration: unable to assess  Attention Span: unable to assess Recall: unable to assess Fund of Knowledge: unable to assess   Physical Exam:  Constitutional:      Appearance: the patient is not toxic-appearing.  Pulmonary:     Effort: Pulmonary effort is normal.   Review of Systems (unable to assess)   Blood pressure (!) 135/97, pulse (!) 110, temperature 98.9 F (37.2 C), temperature source Oral, resp. rate 20, weight 63.9 kg, SpO2 100 %. Body mass  index is 25.75 kg/m.   Assets  Assets:Communication Skills; Housing; Social Support   Treatment Plan Summary: Daily contact with patient to assess and evaluate symptoms and progress in treatment, Medication management, and Plan    ASSESSMENT:  Diagnoses / Active Problems: Brief Psychotic Disorder Cannabis use disorder   PLAN: Safety and Monitoring:  -- Involuntary admission to inpatient psychiatric unit for safety, stabilization and treatment  -- Daily contact with patient to assess and evaluate symptoms and progress in treatment  -- Patient's case to be discussed in multi-disciplinary team meeting  -- Observation Level : q15 minute checks  -- Vital  signs:  q12 hours  -- Precautions: suicide, elopement, and assault  2. Psychiatric Diagnoses and Treatment:  #Brief psychotic disorder  -- START zyprexa zydis 5mg  Q12H OR zyprexa IM 5mg  Q12H for psychosis  -- START zyprexa 5mg  IM TID PRN for agitation  -- START zyprexa zydis 5mg  TID PRN for agitation  -- CONTINUE ativan 2mg  oral TID PRN for agitation  --  The risks/benefits/side-effects/alternatives to this medication were discussed with the patient. The patient did not consent to medication trial. A second physician came to assess patient for forced medication order.   #Cannabis use -will encourage abstinence    -- Metabolic profile and EKG monitoring obtained while on an atypical antipsychotic (BMI: Lipid Panel: HbgA1c: QTc:)   -- Encouraged patient to participate in unit milieu and in scheduled group therapies   -- Short Term Goals: Ability to identify changes in lifestyle to reduce recurrence of condition will improve, Ability to verbalize feelings will improve, Ability to disclose and discuss suicidal ideas, Ability to demonstrate self-control will improve, Ability to identify and develop effective coping behaviors will improve, Ability to maintain clinical measurements within normal limits will improve, Compliance with  prescribed medications will improve, and Ability to identify triggers associated with substance abuse/mental health issues will improve  -- Long Term Goals: Improvement in symptoms so as ready for discharge   3. Medical Issues Being Addressed:  None   #Labs Reviewed/Pending  UDS+THC, CBC wnl, CMP wnl, ethanol<10, salicylate <7, tylenol<10, TSH wnl, LDL 120, A1c 4.7, beta hcg<5.  EKG re-ordered   4. Discharge Planning:  -- Social work and case management to assist with discharge planning and identification of hospital follow-up needs prior to discharge  -- Estimated LOS: 5-7 days  -- Discharge Concerns: Need to establish a safety plan; Medication compliance and effectiveness  -- Discharge Goals: Return home with outpatient referrals for mental health follow-up including medication management/psychotherapy  The patient is agreeable with the medication plan, as above. We will monitor the patient's response to pharmacologic treatment, and adjust medications as necessary. Patient is encouraged to participate in group therapy while admitted to the psychiatric unit. We will address other chronic and acute stressors, which contributed to the patient's psychosis, in order to reduce the risk of self-harm at discharge.  I certify that inpatient services furnished can reasonably be expected to improve the patient's condition.    Total Time Spent in Direct Patient Care:  I personally spent 60 minutes on the unit in direct patient care. The direct patient care time included face-to-face time with the patient, reviewing the patient's chart, communicating with other professionals, and coordinating care. Greater than 50% of this time was spent in counseling or coordinating care with the patient regarding goals of hospitalization, psycho-education, and discharge planning needs.   Karie FetchStephanie Caydan Mctavish, MD, PGY-1

## 2022-04-23 ENCOUNTER — Encounter (HOSPITAL_COMMUNITY): Payer: Self-pay

## 2022-04-23 MED ORDER — LORAZEPAM 1 MG PO TABS
1.0000 mg | ORAL_TABLET | Freq: Three times a day (TID) | ORAL | Status: DC
  Administered 2022-04-23 – 2022-04-25 (×7): 1 mg via ORAL
  Filled 2022-04-23 (×7): qty 1

## 2022-04-23 MED ORDER — OLANZAPINE 10 MG PO TBDP
10.0000 mg | ORAL_TABLET | Freq: Every day | ORAL | Status: DC
  Administered 2022-04-23: 10 mg via ORAL
  Filled 2022-04-23 (×2): qty 1

## 2022-04-23 MED ORDER — OLANZAPINE 10 MG IM SOLR
5.0000 mg | Freq: Every day | INTRAMUSCULAR | Status: DC
  Filled 2022-04-23 (×2): qty 10

## 2022-04-23 MED ORDER — OLANZAPINE 5 MG PO TBDP
5.0000 mg | ORAL_TABLET | Freq: Every day | ORAL | Status: DC
  Administered 2022-04-24 – 2022-04-25 (×2): 5 mg via ORAL
  Filled 2022-04-23 (×4): qty 1

## 2022-04-23 MED ORDER — OLANZAPINE 10 MG IM SOLR
5.0000 mg | Freq: Every day | INTRAMUSCULAR | Status: DC
  Filled 2022-04-23 (×4): qty 10

## 2022-04-23 MED ORDER — OLANZAPINE 10 MG PO TBDP
10.0000 mg | ORAL_TABLET | Freq: Every day | ORAL | Status: DC
  Filled 2022-04-23 (×2): qty 1

## 2022-04-23 MED ORDER — OLANZAPINE 10 MG IM SOLR
10.0000 mg | Freq: Every day | INTRAMUSCULAR | Status: DC
  Filled 2022-04-23 (×2): qty 10

## 2022-04-23 NOTE — BH IP Treatment Plan (Signed)
Interdisciplinary Treatment and Diagnostic Plan Update  04/23/2022 Time of Session: 11:25 AM  Christy [name] MRN: [id number]  Principal Diagnosis: Brief psychotic disorder  Secondary Diagnoses: Principal Problem:   Brief psychotic disorder   Current Medications:  Current Facility-Administered Medications  Medication Dose Route Frequency Provider Last Rate Last Admin   acetaminophen (TYLENOL) tablet 650 mg  650 mg Oral Q6H PRN Motley-Mangrum, Jadeka A, PMHNP       alum & mag hydroxide-simeth (MAALOX/MYLANTA) 200-200-20 MG/5ML suspension 30 mL  30 mL Oral Q4H PRN Motley-Mangrum, Jadeka A, PMHNP       hydrOXYzine (ATARAX) tablet 25 mg  25 mg Oral TID PRN Motley-Mangrum, Jadeka A, PMHNP   25 mg at 04/23/22 0820   LORazepam (ATIVAN) tablet 1 mg  1 mg Oral Q8H Massengill, Nathan, MD   1 mg at 04/23/22 1104   LORazepam (ATIVAN) tablet 2 mg  2 mg Oral TID PRN Motley-Mangrum, Jadeka A, PMHNP       magnesium hydroxide (MILK OF MAGNESIA) suspension 30 mL  30 mL Oral Daily PRN Motley-Mangrum, Jadeka A, PMHNP       OLANZapine zydis (ZYPREXA) disintegrating tablet 10 mg  10 mg Oral QHS Karie Fetch, MD       Or   OLANZapine (ZYPREXA) injection 10 mg  10 mg Intramuscular QHS Karie Fetch, MD       OLANZapine [facility]) injection 5 mg  5 mg Intramuscular TID PRN Karie Fetch, MD       [START ON 04/24/2022] OLANZapine zydis (ZYPREXA) disintegrating tablet 5 mg  5 mg Oral Daily Karie Fetch, MD       Or   Melene Muller ON 04/24/2022] OLANZapine (ZYPREXA) injection 5 mg  5 mg Intramuscular Daily Karie Fetch, MD       OLANZapine zydis (ZYPREXA) disintegrating tablet 5 mg  5 mg Oral TID PRN Karie Fetch, MD       prenatal multivitamin tablet 1 tablet  1 tablet Oral Q1200 Motley-Mangrum, Jadeka A, PMHNP   1 tablet at 04/23/22 1104   traZODone (DESYREL) tablet 50 mg  50 mg Oral QHS PRN Motley-Mangrum, Jadeka A, PMHNP       PTA Medications: No medications prior to admission.     Patient Stressors: Financial difficulties   Marital or family conflict   Substance abuse    Patient Strengths: Supportive family/friends   Treatment Modalities: Medication Management, Group therapy, Case management,  1 to 1 session with clinician, Psychoeducation, Recreational therapy.   Physician Treatment Plan for Primary Diagnosis: Brief psychotic disorder Long Term Goal(s): Improvement in symptoms so as ready for discharge   Short Term Goals: Ability to identify changes in lifestyle to reduce recurrence of condition will improve Ability to verbalize feelings will improve Ability to disclose and discuss suicidal ideas Ability to demonstrate self-control will improve Ability to identify and develop effective coping behaviors will improve Ability to maintain clinical measurements within normal limits will improve Compliance with prescribed medications will improve Ability to identify triggers associated with substance abuse/mental health issues will improve  Medication Management: Evaluate patient's response, side effects, and tolerance of medication regimen.  Therapeutic Interventions: 1 to 1 sessions, Unit Group sessions and Medication administration.  Evaluation of Outcomes: Not Progressing  Physician Treatment Plan for Secondary Diagnosis: Principal Problem:   Brief psychotic disorder  Long Term Goal(s): Improvement in symptoms so as ready for discharge   Short Term Goals: Ability to identify changes in lifestyle to reduce recurrence of condition will improve Ability to verbalize feelings  will improve Ability to disclose and discuss suicidal ideas Ability to demonstrate self-control will improve Ability to identify and develop effective coping behaviors will improve Ability to maintain clinical measurements within normal limits will improve Compliance with prescribed medications will improve Ability to identify triggers associated with substance abuse/mental health  issues will improve     Medication Management: Evaluate patient's response, side effects, and tolerance of medication regimen.  Therapeutic Interventions: 1 to 1 sessions, Unit Group sessions and Medication administration.  Evaluation of Outcomes: Not Progressing   RN Treatment Plan for Primary Diagnosis: Brief psychotic disorder Long Term Goal(s): Knowledge of disease and therapeutic regimen to maintain health will improve  Short Term Goals: Ability to remain free from injury will improve, Ability to verbalize frustration and anger appropriately will improve, Ability to demonstrate self-control, Ability to participate in decision making will improve, Ability to verbalize feelings will improve, Ability to disclose and discuss suicidal ideas, Ability to identify and develop effective coping behaviors will improve, and Compliance with prescribed medications will improve  Medication Management: RN will administer medications as ordered by provider, will assess and evaluate patient's response and provide education to patient for prescribed medication. RN will report any adverse and/or side effects to prescribing provider.  Therapeutic Interventions: 1 on 1 counseling sessions, Psychoeducation, Medication administration, Evaluate responses to treatment, Monitor vital signs and CBGs as ordered, Perform/monitor CIWA, COWS, AIMS and Fall Risk screenings as ordered, Perform wound care treatments as ordered.  Evaluation of Outcomes: Not Progressing   LCSW Treatment Plan for Primary Diagnosis: Brief psychotic disorder Long Term Goal(s): Safe transition to appropriate next level of care at discharge, Engage patient in therapeutic group addressing interpersonal concerns.  Short Term Goals: Engage patient in aftercare planning with referrals and resources, Increase social support, Increase ability to appropriately verbalize feelings, Increase emotional regulation, Facilitate acceptance of mental health  diagnosis and concerns, Facilitate patient progression through stages of change regarding substance use diagnoses and concerns, Identify triggers associated with mental health/substance abuse issues, and Increase skills for wellness and recovery  Therapeutic Interventions: Assess for all discharge needs, 1 to 1 time with Social worker, Explore available resources and support systems, Assess for adequacy in community support network, Educate family and significant other(s) on suicide prevention, Complete Psychosocial Assessment, Interpersonal group therapy.  Evaluation of Outcomes: Not Progressing   Progress in Treatment: Attending groups: No. Participating in groups: No. Taking medication as prescribed: No. Toleration medication: No. Family/Significant other contact made: No, will contact:  CSW can't even get an assessment out of patient  Patient understands diagnosis: No. Discussing patient identified problems/goals with staff: No. Medical problems stabilized or resolved: Yes. Denies suicidal/homicidal ideation: Yes. Pt is not speaking much very limited  Issues/concerns per patient self-inventory: No.   New problem(s) identified: No, Describe:  None reported   New Short Term/Long Term Goal(s): medication stabilization, elimination of SI thoughts, development of comprehensive mental wellness plan.    Patient Goals:  REFUSED TO GIVE GOAL   Discharge Plan or Barriers: Patient recently admitted. CSW will continue to follow and assess for appropriate referrals and possible discharge planning.    Reason for Continuation of Hospitalization: Aggression Anxiety Depression Medication stabilization  Estimated Length of Stay: 6-9 days   Last 3 [place] Suicide Severity Risk Score: Flowsheet Row Ad[facility] (Current) [facility] from 04/20/2022 in [facility]  C-SSRS RISK CATEGORY No Risk No Risk  Last PHQ 2/9 Scores:     No data to display          Scribe for Treatment Team: Beather Arbour 04/23/2022 12:42 PM

## 2022-04-23 NOTE — Progress Notes (Signed)
[facility] LCSW Progress Note   04/23/2022    3:25 PM  Remee Digiacomo   [id number]   Type of Contact and Topic:  PSA attempt #2    CSW made second attempt to complete PSA with patient; continues to be electively mute. CSW team will make last attempt to complete PSA with patient on 4/13 prior to completing w/ chart review.     Signed:  Corky Crafts, MSW, LCSW, LCAS 04/23/2022 3:25 PM

## 2022-04-23 NOTE — Progress Notes (Signed)
Pt continues to be mad about her situation, pt appears to not want to listen to writer when the IVC process was explained , pt informed to talk to the doctor about her leaving. Pt called 911 earlier on previous shift, writer tried to explain that the IVC means she has a court date and we are keeping her for the court , but the pt did not want to hear the process , so she was encouraged to talk to the doctor    04/23/22 2000  Psych Admission Type (Psych Patients Only)  Admission Status Involuntary  Psychosocial Assessment  Patient Complaints Anxiety;Agitation  Eye Contact Fair  Facial Expression Flat  Affect Anxious;Preoccupied  Speech Soft;Elective mutism  Interaction Guarded;Cautious  Motor Activity Slow  Appearance/Hygiene In scrubs  Behavior Characteristics Unwilling to participate;Anxious;Agitated  Mood Suspicious;Sad  Aggressive Behavior  Effect No apparent injury  Thought Process  Coherency Blocking  Content Preoccupation  Delusions Paranoid  Perception WDL  Hallucination None reported or observed  Judgment Poor  Confusion None  Danger to Self  Current suicidal ideation? Denies  Danger to Others  Danger to Others None reported or observed

## 2022-04-23 NOTE — Group Note (Signed)
Recreation Therapy Group Note   Group Topic:Relaxation  Group Date: 04/23/2022 Start Time: 1024 End Time: 1046 Facilitators: Dontae Minerva-McCall, LRT,CTRS Location: [address] Dayroom   Goal Area(s) Addresses:  Patient will define components of whole wellness. Patient will verbalize benefit of whole wellness.  Group Description: Music Therapy.  LRT and patients discussed how the types of music you listen to can help you clear your mind, feel calm and relax.  Patients were then given the opportunity to pick songs that they found relaxing and calming.    Affect/Mood: N/A   Participation Level: Did not attend    Clinical Observations/Individualized Feedback:     Plan: Continue to engage patient in RT group sessions 2-3x/week.   Christy [name], LRT,CTRS 04/23/2022 11:50 AM

## 2022-04-23 NOTE — Group Note (Unsigned)
Date:  04/24/2022 Time:  11:24 AM  Group Topic/Focus:  Recovery Goals:   The focus of this group is to identify appropriate goals for recovery and establish a plan to achieve them.    Participation Level:  Did Not Attend   Christy [name] 04/24/2022, 11:24 AM

## 2022-04-23 NOTE — Progress Notes (Signed)
Recreation Therapy Notes  1054:  LRT attempted to complete recreation therapy assessment for the second time.  Pt still would not answer questions.  Pt acknowledged LRT when called by name.  Pt then turned back towards the window and closed her eyes.  LRT will attempt to complete assessment at a later time.     Christy [name], LRT,CTRS Christy [name] 04/23/2022 1:08 PM

## 2022-04-23 NOTE — Progress Notes (Signed)
[name] MD Resident Progress Note  04/23/2022 10:58 AM Christy [name]  MRN:  [id number] Principal Problem: Brief psychotic disorder Diagnosis: Principal Problem:   Brief psychotic disorder  Reason for admission   Christy [name] is a 27 y.o., female with no known past psychiatric history who presents to the [facility] involuntarily from [facility] for evaluation and management of acute psychosis  (admitted on 04/21/2022, total  LOS: 2 days )  Chart review from last 24 hours   The patient's chart was reviewed and nursing notes were reviewed. The patient's case was discussed in multidisciplinary team meeting.  - Overnight events per chart review: none - Patient took all scheduled meds  - Patient did not take any PRN meds   Yesterday, the psychiatry team made the following recommendations:  -- START zyprexa zydis  Q12H OR zyprexa IM  Q12H for psychosis  -- START zyprexa  IM TID PRN for agitation  -- START zyprexa zydis  TID PRN for agitation  -- CONTINUE ativan  oral TID PRN for agitation  Per nursing in bed progression, patient appears flat, guarded, suspicious, selectively mute. Reports she is paranoid, smells like urine, is whispering, thinking that there is stuff on the floor and then whispering "I'm fine." Reports she is not eating or drinking. Increased concern for catatonia.   Information obtained during interview   The patient was seen and evaluated on the unit. On assessment today the patient reports "I'm good." She then proceeded to stop talking for the rest of the interview and would only occasionally nod yes/no to questions. No answer when asked about sleep. She nodded when asked about eating breakfast. Nodded no to SI/HI. No answer to AVH. Continues to look around the room, stares, increased latency in response to questions. She denied any new acute pain.   Patient endorses unknown sleep; endorses that she ate breakfast.   Patient denied med side effects.   Bush-Francis Catatonia Rating Scale from [email or web address]  on 04/23/2022 ** All calculations should be rechecked by clinician prior to use ** RESULT SUMMARY: 9 points Bush-Francis Catatonia Rating Scale Higher scores indicate greater severity of catatonia INPUTS: Excitement --> 0 = Absent Immobility/stupor --> 1 = Sits abnormally still; may interact briefly Mutism --> 2 = Speaks <20 words per 5 min Staring --> 2 = Gaze held longer than 20 sec; occasionally shifts attention Posturing/catalepsy --> 2 = >1 min to <15 min Grimacing --> 0 = Absent Echopraxia/echolalia --> 0 = Absent Stereotypy --> 0 = Absent Mannerisms --> 0 = Absent Verbigeration --> 0 = Absent Rigidity --> 0 = Absent Negativism --> 0 = Absent Waxy flexibility --> 0 = Absent Withdrawal --> 2 = Minimal PO intake/interaction for >1 day Impulsivity --> 0 = Absent Automatic obedience --> 0 = Absent Mitgehen --> 0 = Absent Gegenhalten --> 0 = Absent Ambitendency --> 0 = Absent Grasp reflex --> 0 = Absent Perseveration --> 0 = Absent Combativeness --> 0 = Absent Autonomic abnormality --> 0 = Absent  Review of Systems  Reports no new pain   Past Psychiatric History Previous Psych Diagnoses: Patient's mom reports none  Prior inpatient treatment: Patient's mom reports none  Current/prior outpatient treatment: Patient's mom reports none  Prior rehab hx: Patient's mom reports none  Psychotherapy hx: Patient's mom reports none  History of suicide: Patient's mom reports none  History of homicide: Patient's mom reports none  Psychiatric medication history: Patient's mom reports none  Psychiatric medication compliance history: not  applicable  Neuromodulation history: Patient's mom reports none  Current Psychiatrist: Patient's mom reports none  Current therapist: Patient's mom reports none Past Medical History Medical Diagnoses: Patient's mom reports none  Home Rx: Patient's mom reports  none  Prior Hosp: Patient's mom reports none  Prior Surgeries/Trauma: Patient's mom reports none  Head trauma, LOC, concussions, seizures: none Allergies: Penicillin- rash, hives, anaphylaxis as a child  LMP: unable to assess  Contraception: Patient's mom reports none  PCP: none noted in chart review  Family History Medical: Mother - Hypertension, Maternal and paternal grandparents have hypertension and diabetes  Psych: Patient's mom reports none  Psych Rx: Not applicable  SA/HA: Patient's mom reports none  Substance use family hx: Maternal grandmother smokes marijuana and cocaine, Maternal aunt has alcoholism, Paternal aunt has hx of cocaine use.  Social History  Childhood: Grew up in [place] until third grade. Moved to [place] in third grade.  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 rate 20, weight 63.9 kg, SpO2 100 %. Body mass index is 25.75 kg/m. Sleep: Nursing reports poor  Current Medications: Current Facility-Administered Medications  Medication Dose Route Frequency Provider Last Rate Last Admin   acetaminophen (TYLENOL) tablet 650 mg  650 mg Oral Q6H PRN Motley-Mangrum, Jadeka A, PMHNP       alum & mag hydroxide-simeth (MAALOX/MYLANTA) 200-200-20 MG/5ML suspension 30 mL  30 mL Oral Q4H PRN Motley-Mangrum, Jadeka A, PMHNP       hydrOXYzine (ATARAX) tablet 25 mg  25 mg  Oral TID PRN Motley-Mangrum, Jadeka A, PMHNP   25 mg at 04/23/22 0820   LORazepam (ATIVAN) tablet 1 mg  1 mg Oral Q8H Massengill, Nathan, MD       LORazepam (ATIVAN) tablet 2 mg  2 mg Oral TID PRN Motley-Mangrum, Jadeka A, PMHNP       magnesium hydroxide (MILK OF MAGNESIA) suspension 30 mL  30 mL Oral Daily PRN Motley-Mangrum, Jadeka A, PMHNP       OLANZapine zydis (ZYPREXA) disintegrating tablet 10 mg  10 mg Oral QHS Karie Fetch, MD       Or   OLANZapine (ZYPREXA) injection 10 mg  10 mg Intramuscular QHS Karie Fetch, MD       OLANZapine [facility]) injection 5 mg  5 mg Intramuscular TID PRN Karie Fetch, MD       [START ON 04/24/2022] OLANZapine zydis (ZYPREXA) disintegrating tablet 5 mg  5 mg Oral Daily Karie Fetch, MD       Or   Melene Muller ON 04/24/2022] OLANZapine (ZYPREXA) injection 5 mg  5 mg Intramuscular Daily Karie Fetch, MD       OLANZapine zydis (ZYPREXA) disintegrating tablet 5 mg  5 mg Oral TID PRN Karie Fetch, MD       prenatal multivitamin tablet 1 tablet  1 tablet Oral Q1200 Motley-Mangrum, Jadeka A, PMHNP   1 tablet at 04/22/22 1455  traZODone (DESYREL) tablet 50 mg  50 mg Oral QHS PRN Motley-Mangrum, Ezra Sites, PMHNP       Physical Exam Constitutional:      Appearance: the patient is not toxic-appearing.  Pulmonary:     Effort: Pulmonary effort is normal.  Neurological:     Mental Status: the patient is alert  AIMS: Patient refused   Mental Status Exam   General Appearance: appears at stated age  Behavior: guarded  Psychomotor Activity:Psychomotor retardation noted   Eye Contact: fair, stares  Speech: decreased amount, tone, volume. Increased latency  Mood: "I'm good" Affect: flat   Thought Process: unable to assess  Descriptions of Associations: unable to assess  Thought Content: No answer to AVH. Appears paranoid Hallucinations: No answer to AH, VH  Delusions: Appears Paranoid Suicidal Thoughts: Denies SI, intention, plan  Homicidal  Thoughts: Denies HI, intention, plan   Alertness/Orientation: alert   Insight: poor Judgment: poor  Memory: unknown   Art therapist  Concentration: unknown  Attention Span: unknown Recall: unknown Fund of Knowledge: unknown  Assets: Manufacturing systems engineer; Housing; Arboriculturist Plan Summary: Daily contact with patient to assess and evaluate symptoms and progress in treatment and Medication management Summary   Shakesha Soltau is a 27 y.o., female with no known past psychiatric history who presents to the [facility] involuntarily from [facility] Emergency Department for evaluation and management of acute psychosis   Diagnoses / Active Problems: Brief psychotic disorder Principal Problem:   Brief psychotic disorder -Cannabis use disorder   Plan  Safety and Monitoring: -- Involuntary admission to inpatient psychiatric unit for safety, stabilization and treatment -- Daily contact with patient to assess and evaluate symptoms and progress in treatment -- Patient's case to be discussed in multi-disciplinary team meeting -- Observation Level : q15 minute checks -- Vital signs:  q12 hours -- Precautions: suicide, elopement, and assault  2. Psychiatric Management:  #Brief psychotic disorder (r/o catatonia)  -- INCREASE zyprexa zydis  AM,  PM  OR zyprexa IM  AM,  PM for psychosis (forced medication 4/11) -- START ativan  Q8H PO to r/o catatonia (DO NOT ADMINISTER IM zyprexa within 2 hours of IM ativan administration)  -- CONTINUE zyprexa  IM TID PRN for agitation  -- CONTINUE zyprexa zydis  TID PRN for agitation  -- CONTINUE ativan  oral TID PRN for agitation  -- consider first episode psychosis labs as patient improves   #Cannabis use -will encourage abstinence   The risks/benefits/side-effects/alternatives to this medication were discussed with the patient. The patient did not consent to medication trial. A  second physician came to assess patient for forced medication order.   3. Medical Management  None  4. Routine and other pertinent labs: UDS+THC, CBC wnl, CMP wnl, ethanol<10, salicylate <7, tylenol<10, TSH wnl, LDL 120, A1c 4.7, beta hcg<5. CT head in ED was wnl. EKG Qtc 446.  Lab Results:     Latest Ref Rng & Units 04/20/2022    6:58 PM 01/28/2019    1:00 PM 11/29/2018    9:25 AM  CBC  WBC 4.0 - 10.5 K/uL 9.3  13.1  10.3   Hemoglobin 12.0 - 15.0 g/dL 16.1  09.6  04.5   Hematocrit 36.0 - 46.0 % 38.3  35.1  33.4   Platelets 150 - 400 K/uL 254  271  253       Latest Ref Rng & Units 04/20/2022    6:58 PM 01/28/2019    1:00 PM 07/18/2018  4:45 PM  BMP  Glucose 70 - 99 mg/dL 83  91  72   BUN 6 - 20 mg/dL 5  6  5    Creatinine 0.44 - 1.00 mg/dL 8.65  7.84  6.96   Sodium 135 - 145 mmol/L 139  136  137   Potassium 3.5 - 5.1 mmol/L 3.5  3.7  3.6   Chloride 98 - 111 mmol/L 107  104  104   CO2 22 - 32 mmol/L 25  21  22    Calcium 8.9 - 10.3 mg/dL 8.9  9.1  9.3    Blood Alcohol level:  Lab Results  Component Value Date   ETH <10 04/20/2022   Prolactin: No results found for: "PROLACTIN" Lipid Panel: Lab Results  Component Value Date   CHOL 180 04/20/2022   TRIG 88 04/20/2022   HDL 42 04/20/2022   CHOLHDL 4.3 04/20/2022   VLDL 18 04/20/2022   LDLCALC 120 (H) 04/20/2022   HbgA1c: Lab Results  Component Value Date   HGBA1C 4.7 (L) 04/20/2022   TSH: Lab Results  Component Value Date   TSH 1.707 04/20/2022    4. Group Therapy: -- Encouraged patient to participate in unit milieu and in scheduled group therapies  -- Short Term Goals: Ability to identify changes in lifestyle to reduce recurrence of condition will improve, Ability to verbalize feelings will improve, Ability to disclose and discuss suicidal ideas, Ability to demonstrate self-control will improve, Ability to identify and develop effective coping behaviors will improve, Ability to maintain clinical measurements  within normal limits will improve, Compliance with prescribed medications will improve, and Ability to identify triggers associated with substance abuse/mental health issues will improve -- Long Term Goals: Improvement in symptoms so as ready for discharge -- Patient is encouraged to participate in group therapy while admitted to the psychiatric unit. -- We will address other chronic and acute stressors, which contributed to the patient's Brief psychotic disorder in order to reduce the risk of self-harm at discharge.  5. Discharge Planning:  -- Social work and case management to assist with discharge planning and identification of hospital follow-up needs prior to discharge -- Estimated LOS: 5-7 days -- Discharge Concerns: Need to establish a safety plan; Medication compliance and effectiveness -- Discharge Goals: Return home with outpatient referrals for mental health follow-up including medication management/psychotherapy  I certify that inpatient services furnished can reasonably be expected to improve the patient's condition.   I discussed my assessment, planned testing and intervention for the patient with Dr. Sherron Flemings who agrees with my formulated course of action.  Karie Fetch, MD, PGY-1 04/23/2022, 10:58 AM

## 2022-04-23 NOTE — Group Note (Signed)
Date:  04/23/2022 Time:  10:02 AM  Group Topic/Focus:  Goals Group:   The focus of this group is to help patients establish daily goals to achieve during treatment and discuss how the patient can incorporate goal setting into their daily lives to aide in recovery. Orientation:   The focus of this group is to educate the patient on the purpose and policies of crisis stabilization and provide a format to answer questions about their admission.  The group details unit policies and expectations of patients while admitted.    Participation Level:  Did Not Attend   Christy [name] 04/23/2022, 10:02 AM

## 2022-04-23 NOTE — Group Note (Unsigned)
Date:  04/23/2022 Time:  11:18 AM  Group Topic/Focus:  Managing Feelings:   The focus of this group is to identify what feelings patients have difficulty handling and develop a plan to handle them in a healthier way upon discharge.     Participation Level:  {BHH PARTICIPATION GBMSX:11552}  Participation Quality:  {BHH PARTICIPATION QUALITY:22265}  Affect:  {BHH AFFECT:22266}  Cognitive:  {BHH COGNITIVE:22267}  Insight: {BHH Insight2:20797}  Engagement in Group:  {BHH ENGAGEMENT IN GROUP:22268}  Modes of Intervention:  {BHH MODES OF INTERVENTION:22269}  Additional Comments:  ***  Phyllistine Domingos W Hailey Stormer 04/23/2022, 11:18 AM

## 2022-04-23 NOTE — Progress Notes (Signed)
Pt affect remains flat with delayed responses, blank stares, elective mutism and increased irritation. Denies SI, HI, AVH and pain with head node. Refused to engage with DSS CSW in reference to her toddler's placement with her mother. Refused meals, continues to decline in ADLs as she will not shower, eat or change bed linens. Offered meals as scheduled but will not touch her food X3 today including supper tray on 04-22-22. Tolerates fluids well. Refused to engage in scheduled groups or any unit activities. OOB this evening with blunted affect; call 911 "To come pick me up because they have a warrant for my arrest". Observed attempting to elope off unit, "This door open, why you can't open it. Open it now, why not". Declined her noon Ativan when offered despite multiple prompts. Continued support, encouragement and reassurance offered. Tolerated fluids well when offered. Safety maintained.

## 2022-04-23 NOTE — Group Note (Signed)
Date:  04/23/2022 Time:  9:08 PM  Group Topic/Focus:  Wrap-Up Group:   The focus of this group is to help patients review their daily goal of treatment and discuss progress on daily workbooks.    Participation Level:  Did Not Attend  Scot Dock 04/23/2022, 9:08 PM

## 2022-04-23 NOTE — BHH Group Notes (Signed)
Spirituality group facilitated by Kathleen Argue, BCC.  Group Description: Group focused on topic of hope. Patients participated in facilitated discussion around topic, connecting with one another around experiences and definitions for hope. Group members engaged with visual explorer photos, reflecting on what hope looks like for them today. Group engaged in discussion around how their definitions of hope are present today in hospital.  Modalities: Psycho-social ed, Adlerian, Narrative, MI  Patient Progress: Christy [name] was asleep and did not attend.

## 2022-04-24 MED ORDER — OLANZAPINE 10 MG IM SOLR
5.0000 mg | Freq: Every day | INTRAMUSCULAR | Status: DC
  Filled 2022-04-24 (×3): qty 10

## 2022-04-24 MED ORDER — OLANZAPINE 5 MG PO TBDP
5.0000 mg | ORAL_TABLET | Freq: Every day | ORAL | Status: DC
  Administered 2022-04-24 – 2022-04-25 (×2): 5 mg via ORAL
  Filled 2022-04-24 (×4): qty 1

## 2022-04-24 NOTE — Group Note (Signed)
Date:  04/24/2022 Time:  10:57 AM  Group Topic/Focus:  Making Healthy Choices:   The focus of this group is to help patients identify negative/unhealthy choices they were using prior to admission and identify positive/healthier coping strategies to replace them upon discharge.    Participation Level:  Did Not Attend   Sonny Dandy Kaizen Ibsen 04/24/2022, 10:57 AM

## 2022-04-24 NOTE — BHH Counselor (Signed)
Adult Comprehensive Assessment  Patient ID: Christy [name], female   DOB: [birth year], 27 y.o.   MRN: [id number]  Information Source: Information source: Patient  Current Stressors:  Patient states their primary concerns and needs for treatment are:: " I had a moment " Patient states their goals for this hospitilization and ongoing recovery are:: " I need to get home , I know my daughter is missing me " Educational / Learning stressors: "No" Employment / Job issues: " I need an job " Family Relationships: " No Engineer, petroleum / Lack of resources (include bankruptcy): " No" Housing / Lack of housing: " No" Physical health (include injuries & life threatening diseases): " No" Social relationships: " I need my daughter father to get out of those handcuffs and come be a parent because I can no longer do this alone, it is too much " Substance abuse: " No" Bereavement / Loss: " No"  Living/Environment/Situation:  Living Arrangements: Alone, Children Living conditions (as described by patient or guardian): Pt stated, " I don't know what to call it " Who else lives in the home?: Pt states that she lives alone How long has patient lived in current situation?: Pt did not say What is atmosphere in current home: Other (Comment) (" it is safe ")  Family History:  Marital status: Single Are you sexually active?:  (Did not assess) What is your sexual orientation?: Heterosexual Has your sexual activity been affected by drugs, alcohol, medication, or emotional stress?: pt did not say Does patient have children?: Yes How many children?: 1 How is patient's relationship with their children?: Pt has a daughter and stated, " I love her and she love me "  Childhood History:  By whom was/is the patient raised?: Mother, Grandparents Description of patient's relationship with caregiver when they were a child: " sweet " Patient's description of current relationship with people who raised him/her: " strict  " How were you disciplined when you got in trouble as a child/adolescent?: Pt said that her mom would tell her dad to handle it but her dad would never whoop her per pt Does patient have siblings?:  (Pt did not say) Did patient suffer any verbal/emotional/physical/sexual abuse as a child?: No Did patient suffer from severe childhood neglect?: No Has patient ever been sexually abused/assaulted/raped as an adolescent or adult?: No Was the patient ever a victim of a crime or a disaster?: No Witnessed domestic violence?: No Has patient been affected by domestic violence as an adult?: No  Education:  Highest grade of school patient has completed: " 12th grade " Currently a student?: No Learning disability?: Yes What learning problems does patient have?: ADHD  Employment/Work Situation:   Employment Situation: Unemployed Patient's Job has Been Impacted by Current Illness: No What is the Longest Time Patient has Held a Job?: 3 1/2 -4 years 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?: No  Financial Resources:   Financial resources: No income, Medicaid Does patient have a Lawyer or guardian?: No  Alcohol/Substance Abuse:   What has been your use of drugs/alcohol within the last 12 months?: " Marijauna " If attempted suicide, did drugs/alcohol play a role in this?: No Alcohol/Substance Abuse Treatment Hx: Denies past history Has alcohol/substance abuse ever caused legal problems?: No  Social Support System:   Patient's Community Support System: Good Describe Community Support System: Family Type of faith/religion: Ephriam Knuckles How does patient's faith  help to cope with current illness?: church on the phone  Leisure/Recreation:   Do You Have Hobbies?: Yes Leisure and Hobbies: " doing hair "  Strengths/Needs:   What is the patient's perception of their strengths?: Doing eyebrows Patient states they can  use these personal strengths during their treatment to contribute to their recovery: pt did not say , other than getting out of hospital and getting a suite so she can start doing hair instead of doing it in her home Patient states these barriers may affect/interfere with their treatment: Pt states that she is ready to DC Patient states these barriers may affect their return to the community: No Other important information patient would like considered in planning for their treatment: N/A  Discharge Plan:   Currently receiving community mental health services: No Patient states concerns and preferences for aftercare planning are: Pt stated that she does not need therapy or psychiatry services Patient states they will know when they are safe and ready for discharge when: Pt stated that she does not need therapy or psychiatry services Does patient have access to transportation?: Yes Does patient have financial barriers related to discharge medications?: No Will patient be returning to same living situation after discharge?: Yes  Summary/Recommendations:   Summary and Recommendations (to be completed by the evaluator): Christy [name] is a 27 y/o female who stated that she had a " moment" which is why she was here . Christy [name] was minimum during assessment did not go much in depth of any past history of mental health or what is currently going on with her. Christy [name] states that right now nothing is stressing her but said that she just wish her daughter father would get out the handcuffs and come home to help and be a parent. Patient does not have a psychiatry history in her past record of being in the hospital or any mental health diagnosis. Christy [name] current diagnosis here is Brief psychotic disorder. Christy [name] said that she will DC back to where is lives and denies seeing a outside provider. Patient declined giving CSW consent to do safety planning or set up follow up appointments. While here, Christy [name] can benefit  from crisis stabilization, medication management, therapeutic milieu, and referrals for services.   Christy [name]. 04/24/2022

## 2022-04-24 NOTE — Progress Notes (Addendum)
[facility] MD Resident Progress Note  04/24/2022 8:25 AM Christy [name]  MRN:  [id number] Principal Problem: Brief psychotic disorder Diagnosis: Principal Problem:   Brief psychotic disorder  Reason for admission   Christy [name] is a 27 y.o., female with no known past psychiatric history who presents to the [facility] involuntarily from [facility] Emergency Department for evaluation and management of acute psychosis  (admitted on 04/21/2022, total  LOS: 3 days )  Chart review from last 24 hours   The patient's chart was reviewed and nursing notes were reviewed. The patient's case was discussed in multidisciplinary team meeting.  - Overnight events per chart review: "Pt affect remains flat with delayed responses, blank stares, elective mutism and increased irritation. Denies SI, HI, AVH and pain with head node. Refused to engage with DSS CSW in reference to her toddler's placement with her mother. Refused meals, continues to decline in ADLs as she will not shower, eat or change bed linens. Offered meals as scheduled but will not touch her food X3 today including supper tray on 04-22-22. Tolerates fluids well. Refused to engage in scheduled groups or any unit activities. OOB this evening with blunted affect; call 911 "To come pick me up because they have a warrant for my arrest". Observed attempting to elope off unit, "This door open, why you can't open it. Open it now, why not". Declined her noon Ativan when offered despite multiple prompts."  - Patient did not take noon ativan yesterday, took other scheduled meds  - Patient took PRN atarax meds   Yesterday, the psychiatry team made the following recommendations:  -- INCREASE zyprexa zydis  AM,  PM  OR zyprexa IM  AM,  PM for psychosis (forced medication 4/11) -- START ativan  Q8H PO to r/o catatonia (DO NOT ADMINISTER IM zyprexa within 2 hours of IM ativan administration)  -- CONTINUE zyprexa  IM TID PRN  for agitation  -- CONTINUE zyprexa zydis  TID PRN for agitation  -- CONTINUE ativan  oral TID PRN for agitation  -- consider first episode psychosis labs as patient improves   Per nursing in bed progression, patient appears depressed, somewhat irritable.   Information obtained during interview   The patient was seen and evaluated on the unit. On assessment today the patient is seen standing up in her room, she reports she felt like she was in a dream, "just had a moment." She reports that leading up to this episode, she felt like other people were scared of her and she was feeling overwhelmed. She reports she is currently on her period and is having some cramps. Discusses she has tylenol that she can take. She denies SI/HI/AVH. She ate breakfast this AM. She asks about who reported her to CPS.    Patient endorses fair sleep; endorses that she ate breakfast.  Patient denied med side effects.   Review of Systems  Reports no new pain   Past Psychiatric History Previous Psych Diagnoses: Patient's mom reports none  Prior inpatient treatment: Patient's mom reports none  Current/prior outpatient treatment: Patient's mom reports none  Prior rehab hx: Patient's mom reports none  Psychotherapy hx: Patient's mom reports none  History of suicide: Patient's mom reports none  History of homicide: Patient's mom reports none  Psychiatric medication history: Patient's mom reports none  Psychiatric medication compliance history: not applicable  Neuromodulation history: Patient's mom reports none  Current Psychiatrist: Patient's mom reports none  Current therapist: Patient's mom reports none Past  Medical History Medical Diagnoses: Patient's mom reports none  Home Rx: Patient's mom reports none  Prior Hosp: Patient's mom reports none  Prior Surgeries/Trauma: Patient's mom reports none  Head trauma, LOC, concussions, seizures: none Allergies: Penicillin- rash, hives, anaphylaxis as a child  LMP:  unable to assess  Contraception: Patient's mom reports none  PCP: none noted in chart review  Family History Medical: Mother - Hypertension, Maternal and paternal grandparents have hypertension and diabetes  Psych: Patient's mom reports none  Psych Rx: Not applicable  SA/HA: Patient's mom reports none  Substance use family hx: Maternal grandmother smokes marijuana and cocaine, Maternal aunt has alcoholism, Paternal aunt has hx of cocaine use.  Social History  Childhood: Grew up in [place] until third grade. Moved to [place] in third grade.  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 rate 20, weight 63.9 kg, SpO2 94 %. Body mass index is 25.75 kg/m. Sleep: reports fair  Current Medications: Current Facility-Administered Medications  Medication Dose Route Frequency Provider Last Rate Last Admin   acetaminophen (TYLENOL) tablet 650 mg  650 mg Oral Q6H PRN Motley-Mangrum, Jadeka A, PMHNP   650 mg at 04/23/22 2117   alum & mag hydroxide-simeth (MAALOX/MYLANTA) 200-200-20 MG/5ML suspension 30 mL  30 mL Oral Q4H PRN Motley-Mangrum, Jadeka A, PMHNP       hydrOXYzine (ATARAX) tablet 25 mg  25 mg Oral TID PRN Motley-Mangrum, Jadeka A, PMHNP   25 mg at 04/23/22 0820   LORazepam (ATIVAN) tablet 1 mg  1 mg Oral Q8H Massengill, Nathan, MD   1 mg at 04/24/22 1610   LORazepam (ATIVAN) tablet 2 mg  2 mg Oral  TID PRN Motley-Mangrum, Jadeka A, PMHNP       magnesium hydroxide (MILK OF MAGNESIA) suspension 30 mL  30 mL Oral Daily PRN Motley-Mangrum, Jadeka A, PMHNP       OLANZapine (ZYPREXA) injection 5 mg  5 mg Intramuscular TID PRN Karie Fetch, MD       OLANZapine zydis (ZYPREXA) disintegrating tablet 5 mg  5 mg Oral Daily Karie Fetch, MD   5 mg at 04/24/22 0745   Or   OLANZapine (ZYPREXA) injection 5 mg  5 mg Intramuscular Daily Karie Fetch, MD       OLANZapine zydis (ZYPREXA) disintegrating tablet 10 mg  10 mg Oral QHS Massengill, Harrold Donath, MD   10 mg at 04/23/22 2028   Or   OLANZapine (ZYPREXA) injection 5 mg  5 mg Intramuscular QHS Massengill, Harrold Donath, MD       OLANZapine zydis (ZYPREXA) disintegrating tablet 5 mg  5 mg Oral TID PRN Karie Fetch, MD       prenatal multivitamin tablet 1 tablet  1 tablet Oral Q1200 Motley-Mangrum, Jadeka A, PMHNP   1 tablet at 04/23/22 1104   traZODone (DESYREL) tablet 50 mg  50 mg Oral QHS PRN Motley-Mangrum, Ezra Sites, PMHNP  Physical Exam Constitutional:      Appearance: the patient is not toxic-appearing.  Pulmonary:     Effort: Pulmonary effort is normal.  Neurological:     Mental Status: the patient is alert  AIMS: No stiffness or cogwheeling noted in bilateral wrists or elbows   Mental Status Exam   General Appearance: appears at stated age  Behavior: guarded  Psychomotor Activity: Mild psychomotor retardation noted.   Eye Contact:  Speech: normal amount, tone, volume, latency  Mood: "just had a moment" Affect: flat   Thought Process: linear, logical  Descriptions of Associations: intact  Thought Content: Denies AVH.  Hallucinations: Denies AH, VH  Delusions: Appears less paranoid  Suicidal Thoughts: Denies SI, intention, plan  Homicidal Thoughts: Denies HI, intention, plan   Alertness/Orientation: alert, oriented to self, place, year   Insight: improving  Judgment: improving   Memory: fair, does not recall  events leading up to hospitalization   Executive Functions  Concentration: fair  Attention Span: fair Recall: fair Fund of Knowledge: fair   Assets: Manufacturing systems engineer; Housing; Social Support  Treatment Plan Summary: Daily contact with patient to assess and evaluate symptoms and progress in treatment and Medication management Summary   Prajna Throneberry is a 27 y.o., female with no known past psychiatric history who presents to the [facility] involuntarily from [facility] Emergency Department for evaluation and management of acute psychosis   Diagnoses / Active Problems: Brief psychotic disorder Principal Problem:   Brief psychotic disorder -Cannabis use disorder   Plan  Safety and Monitoring: -- Involuntary admission to inpatient psychiatric unit for safety, stabilization and treatment -- Daily contact with patient to assess and evaluate symptoms and progress in treatment -- Patient's case to be discussed in multi-disciplinary team meeting -- Observation Level : q15 minute checks -- Vital signs:  q12 hours -- Precautions: suicide, elopement, and assault  2. Psychiatric Management:  #Brief psychotic disorder  #Catatonia  -- CONTINUE  zyprexa zydis 5mg  AM OR zyprexa IM 5mg  AM -- DECREASE zyprexa zydis 5mg  PM  OR zyprexa IM 5mg  PM for psychosis (forced medication 4/11) -- CONTINUE ativan 1mg  Q8H PO (DO NOT ADMINISTER IM zyprexa within 2 hours of IM ativan administration)  -- CONTINUE zyprexa 5mg  IM TID PRN for agitation  -- CONTINUE zyprexa zydis 5mg  TID PRN for agitation  -- CONTINUE ativan 2mg  oral TID PRN for agitation   #Cannabis use -will encourage abstinence   The risks/benefits/side-effects/alternatives to this medication were discussed with the patient. The patient did not consent to medication trial. A second physician came to assess patient for forced medication order.   3. Medical Management  None  4. Routine and other pertinent  labs: UDS+THC, CBC wnl, CMP wnl, ethanol<10, salicylate <7, tylenol<10, TSH wnl, LDL 120, A1c 4.7, beta hcg<5. CT head in ED was wnl. EKG Qtc 446.  Lab Results:     Latest Ref Rng & Units 04/20/2022    6:58 PM 01/28/2019    1:00 PM 11/29/2018    9:25 AM  CBC  WBC 4.0 - 10.5 K/uL 9.3  13.1  10.3   Hemoglobin 12.0 - 15.0 g/dL 74.8  27.0  78.6   Hematocrit 36.0 - 46.0 % 38.3  35.1  33.4   Platelets 150 - 400 K/uL 254  271  253       Latest Ref Rng & Units 04/20/2022    6:58 PM 01/28/2019    1:00 PM 07/18/2018    4:45 PM  BMP  Glucose 70 - 99 mg/dL 83  91  72   BUN 6 - 20 mg/dL Creatinine 0.44 - 1.00 mg/dL 8.29  5.62  1.30   Sodium 135 - 145 mmol/L 139  136  137   Potassium 3.5 - 5.1 mmol/L 3.5  3.7  3.6   Chloride 98 - 111 mmol/L 107  104  104   CO2 22 - 32 mmol/L Calcium 8.9 - 10.3 mg/dL 8.9  9.1  9.3    Blood Alcohol level:  Lab Results  Component Value Date   ETH <10 04/20/2022   Prolactin: No results found for: "PROLACTIN" Lipid Panel: Lab Results  Component Value Date   CHOL 180 04/20/2022   TRIG 88 04/20/2022   HDL 42 04/20/2022   CHOLHDL 4.3 04/20/2022   VLDL 18 04/20/2022   LDLCALC 120 (H) 04/20/2022   HbgA1c: Lab Results  Component Value Date   HGBA1C 4.7 (L) 04/20/2022   TSH: Lab Results  Component Value Date   TSH 1.707 04/20/2022    4. Group Therapy: -- Encouraged patient to participate in unit milieu and in scheduled group therapies  -- Short Term Goals: Ability to identify changes in lifestyle to reduce recurrence of condition will improve, Ability to verbalize feelings will improve, Ability to disclose and discuss suicidal ideas, Ability to demonstrate self-control will improve, Ability to identify and develop effective coping behaviors will improve, Ability to maintain clinical measurements within normal limits will improve, Compliance with prescribed medications will improve, and Ability to identify triggers associated with  substance abuse/mental health issues will improve -- Long Term Goals: Improvement in symptoms so as ready for discharge -- Patient is encouraged to participate in group therapy while admitted to the psychiatric unit. -- We will address other chronic and acute stressors, which contributed to the patient's Brief psychotic disorder in order to reduce the risk of self-harm at discharge.  5. Discharge Planning:  -- Social work and case management to assist with discharge planning and identification of hospital follow-up needs prior to discharge -- Estimated LOS: 5-7 days -- Discharge Concerns: Need to establish a safety plan; Medication compliance and effectiveness -- Discharge Goals: Return home with outpatient referrals for mental health follow-up including medication management/psychotherapy  I certify that inpatient services furnished can reasonably be expected to improve the patient's condition.   I discussed my assessment, planned testing and intervention for the patient with Dr. Enedina Finner who agrees with my formulated course of action.  Karie Fetch, MD, PGY-1 04/24/2022, 8:25 AM

## 2022-04-24 NOTE — Group Note (Signed)
Date:  04/24/2022 Time:  10:26 AM  Group Topic/Focus:  Goals Group:   The focus of this group is to help patients establish daily goals to achieve during treatment and discuss how the patient can incorporate goal setting into their daily lives to aide in recovery. Orientation:   The focus of this group is to educate the patient on the purpose and policies of crisis stabilization and provide a format to answer questions about their admission.  The group details unit policies and expectations of patients while admitted.    Participation Level:  Active  Participation Quality:  Appropriate  Affect:  Appropriate  Cognitive:  Appropriate  Insight: Appropriate  Engagement in Group:  Engaged  Modes of Intervention:  Discussion  Additional Comments:  Patient attended morning orientation/goal group and said that her goal for today is to get home to her child.   Shaquanna Lycan W Farheen Pfahler 04/24/2022, 10:26 AM

## 2022-04-24 NOTE — Progress Notes (Signed)
Adult Psychoeducational Group Note  Date:  04/24/2022 Time:  8:34 PM  Group Topic/Focus:  Wrap-Up Group:   The focus of this group is to help patients review their daily goal of treatment and discuss progress on daily workbooks.  Participation Level:  Did Not Attend  Participation Quality:   Did Not Attend  Affect:   Did Not Attend  Cognitive:   Did Not Attend  Insight: None  Engagement in Group:   Did Not Attend  Modes of Intervention:   Did Not Attend  Additional Comments:  Pt was encouraged to attend wrap up group but did not attend.  Felipa Furnace 04/24/2022, 8:34 PM

## 2022-04-25 DIAGNOSIS — F23 Brief psychotic disorder: Secondary | ICD-10-CM | POA: Diagnosis not present

## 2022-04-25 NOTE — Group Note (Signed)
Date:  04/25/2022 Time:  9:00 PM  Group Topic/Focus:  Wrap-Up Group:   The focus of this group is to help patients review their daily goal of treatment and discuss progress on daily workbooks.    Participation Level:  Did Not Attend   Scot Dock 04/25/2022, 9:00 PM

## 2022-04-25 NOTE — Progress Notes (Signed)
   04/25/22 2141  Psych Admission Type (Psych Patients Only)  Admission Status Involuntary  Psychosocial Assessment  Patient Complaints Anxiety;Suspiciousness  Eye Contact Brief  Facial Expression Flat  Affect Preoccupied;Apprehensive  Speech Soft  Interaction Cautious  Motor Activity Other (Comment) (WDL)  Appearance/Hygiene In scrubs  Behavior Characteristics Anxious;Irritable;Unwilling to participate  Mood Anxious;Suspicious;Irritable  Thought Process  Coherency Blocking  Content Preoccupation  Delusions Paranoid  Perception WDL  Hallucination None reported or observed  Judgment Poor  Confusion None  Danger to Self  Current suicidal ideation? Denies  Danger to Others  Danger to Others None reported or observed

## 2022-04-25 NOTE — Progress Notes (Addendum)
Pt had difficulty sleeping throughout the night and was very restless. She said she was "bored." Pt given PRN meds for insomnia with no effectiveness. Pt was playing with pencils in her room, asking frequent questions about her admission, and attempting to come out in the hallways. Majority of the documented sleep time occurred from naps the patient took during the day.    04/25/22 0601  15 Minute Checks  Location Bedroom  Visual Appearance Calm  Behavior Composed  Sleep (Behavioral Health Patients Only)  Calculate sleep? (Click Yes once per 24 hr at 0600 safety check) Yes  Documented sleep last 24 hours 7.75

## 2022-04-25 NOTE — Plan of Care (Signed)
  Problem: Education: Goal: Will be free of psychotic symptoms Outcome: Progressing Goal: Knowledge of the prescribed therapeutic regimen will improve Outcome: Progressing   Problem: Health Behavior/Discharge Planning: Goal: Compliance with prescribed medication regimen will improve Outcome: Progressing   

## 2022-04-25 NOTE — Progress Notes (Signed)
[facility] MD Resident Progress Note  04/25/2022 11:13 AM Seleni Reller  MRN:  [id number] Principal Problem: Brief psychotic disorder Diagnosis: Principal Problem:   Brief psychotic disorder  Reason for admission   Amirrah Savarese is a 27 y.o., female with no known past psychiatric history who presents to the [facility] involuntarily from [facility] Emergency Department for evaluation and management of acute psychosis  (admitted on 04/21/2022, total  LOS: 4 days )  Chart review from last 24 hours   The patient's chart was reviewed and nursing notes were reviewed. The patient's case was discussed in multidisciplinary team meeting.  Staff report the patient has been getting her medications as prescribed on the nonemergency force medications.  She is taking oral medications.  No side effects are noted.  Staff notes that she is a little bit more cooperative and attended a couple of groups.  Still remains selectively mute at times - Patient to call or schedule medications. - Patient took PRN atarax and trazodone last night.  Yesterday, the psychiatry team made the following recommendations:  -- CONTINUE  zyprexa zydis 5mg  AM OR zyprexa IM 5mg  AM -- DECREASE zyprexa zydis 5mg  PM  OR zyprexa IM 5mg  PM for psychosis (forced medication 4/11) -- CONTINUE ativan 1mg  Q8H PO (DO NOT ADMINISTER IM zyprexa within 2 hours of IM ativan administration)  -- CONTINUE zyprexa 5mg  IM TID PRN for agitation  -- CONTINUE zyprexa zydis 5mg  TID PRN for agitation  -- CONTINUE ativan 2mg  oral TID PRN for agitation   Per nursing in bed progression, patient appears depressed, somewhat irritable.   Information obtained during interview   The patient was seen and evaluated on the unit.  On assessment today the patient continues to lay in bed.  Although staff has noted some response to medications and that the patient has a little bit more communicative.  This morning she was selectively mute.  She did  not admit that she is taking her medications and responded that there was no bad side effects.  She remains withdrawn and nonverbal he denied any SI/HI/AVH.  Slow improvement noted.    Staff endorses fair sleep; she continues to be 9. Patient denied med side effects.   Review of Systems  Reports no new pain   Past Psychiatric History Previous Psych Diagnoses: Patient's mom reports none  Prior inpatient treatment: Patient's mom reports none  Current/prior outpatient treatment: Patient's mom reports none  Prior rehab hx: Patient's mom reports none  Psychotherapy hx: Patient's mom reports none  History of suicide: Patient's mom reports none  History of homicide: Patient's mom reports none  Psychiatric medication history: Patient's mom reports none  Psychiatric medication compliance history: not applicable  Neuromodulation history: Patient's mom reports none  Current Psychiatrist: Patient's mom reports none  Current therapist: Patient's mom reports none Past Medical History Medical Diagnoses: Patient's mom reports none  Home Rx: Patient's mom reports none  Prior Hosp: Patient's mom reports none  Prior Surgeries/Trauma: Patient's mom reports none  Head trauma, LOC, concussions, seizures: none Allergies: Penicillin- rash, hives, anaphylaxis as a child  LMP: unable to assess  Contraception: Patient's mom reports none  PCP: none noted in chart review  Family History Medical: Mother - Hypertension, Maternal and paternal grandparents have hypertension and diabetes  Psych: Patient's mom reports none  Psych Rx: Not applicable  SA/HA: Patient's mom reports none  Substance use family hx: Maternal grandmother smokes marijuana and cocaine, Maternal aunt has alcoholism, Paternal aunt has hx  of cocaine use.  Social History  Childhood: Grew up in [place] until third grade. Moved to [place] in third grade.  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 rate 20, weight 63.9 kg, SpO2 94 %. Body mass index is 25.75 kg/m. Sleep: reports fair  Current Medications: Current Facility-Administered Medications  Medication Dose Route Frequency Provider Last Rate Last Admin   acetaminophen (TYLENOL) tablet 650 mg  650 mg Oral Q6H PRN Motley-Mangrum, Jadeka A, PMHNP   650 mg at 04/23/22 2117   alum & mag hydroxide-simeth (MAALOX/MYLANTA) 200-200-20 MG/5ML suspension 30 mL  30 mL Oral Q4H PRN Motley-Mangrum, Jadeka A, PMHNP       hydrOXYzine (ATARAX) tablet 25 mg  25 mg Oral TID PRN Motley-Mangrum, Jadeka A, PMHNP   25 mg at 04/23/22 0820   LORazepam (ATIVAN) tablet 1 mg  1 mg Oral Q8H Massengill, Nathan, MD   1 mg at 04/25/22 0900   LORazepam (ATIVAN) tablet 2 mg  2 mg Oral TID PRN Motley-Mangrum, Jadeka A, PMHNP       magnesium hydroxide (MILK OF MAGNESIA) suspension 30 mL  30 mL Oral Daily PRN Motley-Mangrum, Jadeka A, PMHNP       OLANZapine (ZYPREXA) injection 5 mg  5 mg Intramuscular TID PRN Karie Fetch, MD       OLANZapine zydis (ZYPREXA) disintegrating tablet 5 mg  5 mg Oral Daily Karie Fetch, MD   5 mg at 04/25/22 0900   Or   OLANZapine (ZYPREXA) injection 5 mg  5 mg Intramuscular Daily Karie Fetch, MD       OLANZapine zydis (ZYPREXA) disintegrating tablet 5 mg  5 mg Oral QHS Karie Fetch, MD   5 mg at 04/24/22 2027   Or   OLANZapine (ZYPREXA) injection 5 mg  5 mg  Intramuscular QHS Karie Fetch, MD       OLANZapine zydis (ZYPREXA) disintegrating tablet 5 mg  5 mg Oral TID PRN Karie Fetch, MD       prenatal multivitamin tablet 1 tablet  1 tablet Oral Q1200 Motley-Mangrum, Jadeka A, PMHNP   1 tablet at 04/24/22 1405   traZODone (DESYREL) tablet 50 mg  50 mg Oral QHS PRN Motley-Mangrum, Jadeka A, PMHNP   50 mg at 04/24/22 2321   Physical Exam Constitutional:      Appearance: the patient is not toxic-appearing.  Pulmonary:     Effort: Pulmonary effort is normal.  Neurological:     Mental Status: the patient is alert  AIMS: No stiffness or cogwheeling noted in bilateral wrists or elbows   Mental Status Exam   General Appearance: appears at stated age  Behavior: guarded  Psychomotor Activity: Mild psychomotor retardation noted.   Eye Contact:  Speech: normal amount, tone, volume, latency  Mood: "just had a moment" Affect: flat   Thought Process: linear, logical  Descriptions of Associations: intact  Thought Content: Denies AVH.  Hallucinations: Denies AH, VH  Delusions: Appears less paranoid  Suicidal Thoughts: Denies SI, intention, plan  Homicidal Thoughts: Denies HI, intention, plan   Alertness/Orientation: alert, oriented to self, place, year   Insight: improving  Judgment: improving   Memory: fair, does not recall events leading up to hospitalization   Executive Functions  Concentration: fair  Attention Span: fair Recall: fair Fund of Knowledge: fair   Assets: Leisure Time; Resilience  Treatment Plan Summary: Daily contact with patient to assess and evaluate symptoms and progress in treatment and Medication management Summary   Rodnesha Elie is a 27 y.o., female with no known past psychiatric history who presents to the [facility] involuntarily from [facility] Emergency Department for evaluation and management of acute psychosis   Diagnoses / Active Problems: Brief psychotic  disorder Principal Problem:   Brief psychotic disorder -Cannabis use disorder   Plan  Safety and Monitoring: -- Involuntary admission to inpatient psychiatric unit for safety, stabilization and treatment -- Daily contact with patient to assess and evaluate symptoms and progress in treatment -- Patient's case to be discussed in multi-disciplinary team meeting -- Observation Level : q15 minute checks -- Vital signs:  q12 hours -- Precautions: suicide, elopement, and assault  2. Psychiatric Management:  #Brief psychotic disorder  #Catatonia  -- CONTINUE  zyprexa zydis 5mg  AM OR zyprexa IM 5mg  AM -- CONTINUE ativan 1mg  Q8H PO (DO NOT ADMINISTER IM zyprexa within 2 hours of IM ativan administration)  -- CONTINUE zyprexa 5mg  IM TID PRN for agitation  -- CONTINUE zyprexa zydis 5mg  TID PRN for agitation  -- CONTINUE ativan 2mg  oral TID PRN for agitation   #Cannabis use -will encourage abstinence   The risks/benefits/side-effects/alternatives to this medication were discussed with the patient. The patient did not consent to medication trial. A second physician came to assess patient for forced medication order.   3. Medical Management  None  4. Routine and other pertinent labs: UDS+THC, CBC wnl, CMP wnl, ethanol<10, salicylate <7, tylenol<10, TSH wnl, LDL 120, A1c 4.7, beta hcg<5. CT head in ED was wnl. EKG Qtc 446.  Lab Results:     Latest Ref Rng & Units 04/20/2022    6:58 PM 01/28/2019    1:00 PM 11/29/2018    9:25 AM  CBC  WBC 4.0 - 10.5 K/uL 9.3  13.1  10.3   Hemoglobin 12.0 - 15.0 g/dL 16.1  09.6  04.5   Hematocrit 36.0 - 46.0 % 38.3  35.1  33.4   Platelets 150 - 400 K/uL 254  271  253       Latest Ref Rng & Units 04/20/2022    6:58 PM 01/28/2019    1:00 PM 07/18/2018    4:45 PM  BMP  Glucose 70 - 99 mg/dL 83  91  72   BUN 6 - 20 mg/dL 5  6  5    Creatinine 0.44 - 1.00 mg/dL 4.09  8.11  9.14   Sodium 135 - 145 mmol/L 139  136  137   Potassium 3.5 - 5.1 mmol/L 3.5  3.7  3.6    Chloride 98 - 111 mmol/L 107  104  104   CO2 22 - 32 mmol/L 25  21  22    Calcium 8.9 - 10.3 mg/dL 8.9  9.1  9.3    Blood Alcohol level:  Lab Results  Component Value Date   ETH <10 04/20/2022   Prolactin: No results found for: "PROLACTIN" Lipid Panel: Lab Results  Component  Value Date   CHOL 180 04/20/2022   TRIG 88 04/20/2022   HDL 42 04/20/2022   CHOLHDL 4.3 04/20/2022   VLDL 18 04/20/2022   LDLCALC 120 (H) 04/20/2022   HbgA1c: Lab Results  Component Value Date   HGBA1C 4.7 (L) 04/20/2022   TSH: Lab Results  Component Value Date   TSH 1.707 04/20/2022    4. Group Therapy: -- Encouraged patient to participate in unit milieu and in scheduled group therapies  -- Short Term Goals: Ability to identify changes in lifestyle to reduce recurrence of condition will improve, Ability to verbalize feelings will improve, Ability to disclose and discuss suicidal ideas, Ability to demonstrate self-control will improve, Ability to identify and develop effective coping behaviors will improve, Ability to maintain clinical measurements within normal limits will improve, Compliance with prescribed medications will improve, and Ability to identify triggers associated with substance abuse/mental health issues will improve -- Long Term Goals: Improvement in symptoms so as ready for discharge -- Patient is encouraged to participate in group therapy while admitted to the psychiatric unit. -- We will address other chronic and acute stressors, which contributed to the patient's Brief psychotic disorder in order to reduce the risk of self-harm at discharge.  5. Discharge Planning:  -- Social work and case management to assist with discharge planning and identification of hospital follow-up needs prior to discharge -- Estimated LOS: Possible discharge by Thursday, 04/29/2022 -- Discharge Concerns: Need to establish a safety plan; Medication compliance and effectiveness -- Discharge Goals: Return  home with outpatient referrals for mental health follow-up including medication management/psychotherapy  I certify that inpatient services furnished can reasonably be expected to improve the patient's condition.     Rex Kras, MD, 04/25/2022, 11:13 AM Patient ID: [name], female   DOB: [birth year], 27 y.o.   MRN: [id number]

## 2022-04-26 DIAGNOSIS — F23 Brief psychotic disorder: Secondary | ICD-10-CM | POA: Diagnosis not present

## 2022-04-26 MED ORDER — OLANZAPINE 10 MG IM SOLR
5.0000 mg | Freq: Every day | INTRAMUSCULAR | Status: DC
  Filled 2022-04-26 (×4): qty 10

## 2022-04-26 MED ORDER — LORAZEPAM 0.5 MG PO TABS
0.5000 mg | ORAL_TABLET | Freq: Two times a day (BID) | ORAL | Status: AC
  Administered 2022-04-26 – 2022-04-27 (×2): 0.5 mg via ORAL
  Filled 2022-04-26 (×2): qty 1

## 2022-04-26 MED ORDER — OLANZAPINE 10 MG PO TBDP
10.0000 mg | ORAL_TABLET | Freq: Every day | ORAL | Status: DC
  Administered 2022-04-26 – 2022-04-27 (×2): 10 mg via ORAL
  Filled 2022-04-26 (×5): qty 1

## 2022-04-26 NOTE — Progress Notes (Signed)
[facility] MD Resident Progress Note  04/26/2022 8:46 AM Christy [name]  MRN:  [id number] Principal Problem: Brief psychotic disorder Diagnosis: Principal Problem:   Brief psychotic disorder  Reason for admission   Christy [name] is a 27 y.o., female with no known past psychiatric history who presents to the [facility] involuntarily from [facility] Emergency Department for evaluation and management of acute psychosis  (admitted on 04/21/2022, total  LOS: 5 days )  Chart review from last 24 hours   The patient's chart was reviewed and nursing notes were reviewed. The patient's case was discussed in multidisciplinary team meeting.  -- Staff report the patient has been irritable with them.  - Patient did not take all scheduled ativan, some were held due to hypotension, complaints of dizziness / sedation  - Patient took no PRNs   Yesterday, the psychiatry team made the following recommendations:  -- CONTINUE zyprexa zydis 5mg  AM OR zyprexa IM 5mg  AM -- CONTINUE zyprexa zydis 5mg  PM  OR zyprexa IM 5mg  PM for psychosis (forced medication 4/11) -- CONTINUE ativan 1mg  Q8H PO (DO NOT ADMINISTER IM zyprexa within 2 hours of IM ativan administration)  -- CONTINUE zyprexa 5mg  IM TID PRN for agitation  -- CONTINUE zyprexa zydis 5mg  TID PRN for agitation  -- CONTINUE ativan 2mg  oral TID PRN for agitation   Per nursing in bed progression, patient appears irritable. She did not go to the cafeteria for breakfast.   Information obtained during interview   The patient was seen and evaluated on the unit.  On assessment today the patient is lying in bed. She reports that she slept fine. She is guarded. She denies changes in her appetite. She reports side effect of "bags under my eyes." She declines that this could be due to sleepiness. When it comes to her reason for hospitalization, she reports that she was "being dramatic." She reports she was feeling overwhelmed. She denies SI/HI/AVH.  She requests her mom to be contacted and asks about discharge. I called mom with patient in the room. Mom reports she feels like she is overall better than when she came in, moving in right direction.   Patient endorses fair sleep. Patient denied med side effects.   Review of Systems  Reports no new pain   Past Psychiatric History Previous Psych Diagnoses: Patient's mom reports none  Prior inpatient treatment: Patient's mom reports none  [name]/prior outpatient treatment: Patient's mom reports none  Prior rehab hx: Patient's mom reports none  Psychotherapy hx: Patient's mom reports none  History of suicide: Patient's mom reports none  History of homicide: Patient's mom reports none  Psychiatric medication history: Patient's mom reports none  Psychiatric medication compliance history: not applicable  Neuromodulation history: Patient's mom reports none  [name] Psychiatrist: Patient's mom reports none  [name] therapist: Patient's mom reports none Past Medical History Medical Diagnoses: Patient's mom reports none  Home Rx: Patient's mom reports none  Prior Hosp: Patient's mom reports none  Prior Surgeries/Trauma: Patient's mom reports none  Head trauma, LOC, concussions, seizures: none Allergies: Penicillin- rash, hives, anaphylaxis as a child  LMP: unable to assess  Contraception: Patient's mom reports none  PCP: none noted in chart review  Family History Medical: Mother - Hypertension, Maternal and paternal grandparents have hypertension and diabetes  Psych: Patient's mom reports none  Psych Rx: Not applicable  SA/HA: Patient's mom reports none  Substance use family hx: Maternal grandmother smokes marijuana and cocaine, Maternal aunt has alcoholism, Paternal aunt  has hx of cocaine use.  Social History  Childhood: Grew up in [place] until third grade. Moved to [place] in third grade.  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 rate 20, weight 63.9 kg, SpO2 100 %. Body mass index is 25.75 kg/m. Sleep: reports fair  [name] Medications: [name] Facility-Administered Medications  Medication Dose Route Frequency Provider Last Rate Last Admin   acetaminophen (TYLENOL) tablet 650 mg  650 mg Oral Q6H PRN Motley-Mangrum, Jadeka A, PMHNP   650 mg at 04/23/22 2117   alum & mag hydroxide-simeth (MAALOX/MYLANTA) 200-200-20 MG/5ML suspension 30 mL  30 mL Oral Q4H PRN Motley-Mangrum, Jadeka A, PMHNP       hydrOXYzine (ATARAX) tablet 25 mg  25 mg Oral TID PRN Motley-Mangrum, Jadeka A, PMHNP   25 mg at 04/23/22 0820   LORazepam (ATIVAN) tablet 0.5 mg  0.5 mg Oral Q12H Massengill, Nathan, MD       LORazepam (ATIVAN) tablet 2 mg  2 mg Oral TID PRN Motley-Mangrum, Jadeka A, PMHNP       magnesium hydroxide (MILK OF MAGNESIA) suspension 30 mL  30 mL Oral Daily PRN Motley-Mangrum, Jadeka A, PMHNP       OLANZapine (ZYPREXA) injection 5 mg  5 mg Intramuscular TID PRN Karie Fetch, MD       OLANZapine zydis (ZYPREXA) disintegrating tablet 10 mg  10 mg Oral QHS Massengill, Nathan, MD       Or   OLANZapine (ZYPREXA) injection 5 mg  5 mg Intramuscular QHS Massengill, Nathan, MD       OLANZapine zydis (ZYPREXA) disintegrating tablet 5 mg  5 mg Oral TID PRN Karie Fetch, MD   5 mg at 04/26/22 0819   prenatal multivitamin tablet 1 tablet  1 tablet Oral Q1200 Motley-Mangrum, Jadeka  A, PMHNP   1 tablet at 04/25/22 1300   traZODone (DESYREL) tablet 50 mg  50 mg Oral QHS PRN Motley-Mangrum, Jadeka A, PMHNP   50 mg at 04/24/22 2321   Physical Exam Constitutional:      Appearance: the patient is not toxic-appearing.  Pulmonary:     Effort: Pulmonary effort is normal.  Neurological:     Mental Status: the patient is alert  AIMS: No stiffness or cogwheeling noted in bilateral wrists or elbows   Mental Status Exam   General Appearance: appears at stated age  Behavior: guarded  Psychomotor Activity: Mild psychomotor retardation noted.   Eye Contact:  Speech: normal amount, tone, volume, latency  Mood: "want to go home" Affect: flat   Thought Process: linear, logical  Descriptions of Associations: intact  Thought Content: Denies AVH.  Hallucinations: Denies AH, VH  Delusions: Appears less paranoid  Suicidal Thoughts: Denies SI, intention, plan  Homicidal Thoughts: Denies HI, intention, plan   Alertness/Orientation: alert, oriented to self, place, year   Insight: improving  Judgment: improving   Memory: fair, does not  recall events leading up to hospitalization   Executive Functions  Concentration: fair  Attention Span: fair Recall: fair Fund of Knowledge: fair   Assets: Leisure Time; Resilience  Treatment Plan Summary: Daily contact with patient to assess and evaluate symptoms and progress in treatment and Medication management Summary   Christy [name] is a 27 y.o., female with no known past psychiatric history who presents to the [facility] involuntarily from [facility] Emergency Department for evaluation and management of acute psychosis   Diagnoses / Active Problems: Brief psychotic disorder Principal Problem:   Brief psychotic disorder -Cannabis use disorder   Plan  Safety and Monitoring: -- Involuntary admission to inpatient psychiatric unit for safety, stabilization and treatment -- Daily contact with patient to  assess and evaluate symptoms and progress in treatment -- Patient's case to be discussed in multi-disciplinary team meeting -- Observation Level : q15 minute checks -- Vital signs:  q12 hours -- Precautions: suicide, elopement, and assault  2. Psychiatric Management:  #Brief psychotic disorder  #Catatonia  -- STOP zyprexa zydis  AM OR zyprexa IM  AM -- INCREASE zyprexa zydis  PM OR zyprexa IM   -- DECREASE ativan 0.5mg  Q12H PO THEN STOP (DO NOT ADMINISTER IM zyprexa within 2 hours of IM ativan administration)  -- CONTINUE zyprexa  IM TID PRN for agitation  -- CONTINUE zyprexa zydis  TID PRN for agitation  -- CONTINUE ativan  oral TID PRN for agitation   #Cannabis use -will encourage abstinence   The risks/benefits/side-effects/alternatives to this medication were discussed with the patient. The patient did not consent to medication trial. A second physician came to assess patient for forced medication order.   3. Medical Management  None  4. Routine and other pertinent labs: UDS+THC, CBC wnl, CMP wnl, ethanol<10, salicylate <7, tylenol<10, TSH wnl, LDL 120, A1c 4.7, beta hcg<5. CT head in ED was wnl. EKG Qtc 446.  Lab Results:     Latest Ref Rng & Units 04/20/2022    6:58 PM 01/28/2019    1:00 PM 11/29/2018    9:25 AM  CBC  WBC 4.0 - 10.5 K/uL 9.3  13.1  10.3   Hemoglobin 12.0 - 15.0 g/dL 16.1  09.6  04.5   Hematocrit 36.0 - 46.0 % 38.3  35.1  33.4   Platelets 150 - 400 K/uL 254  271  253       Latest Ref Rng & Units 04/20/2022    6:58 PM 01/28/2019    1:00 PM 07/18/2018    4:45 PM  BMP  Glucose 70 - 99 mg/dL 83  91  72   BUN 6 - 20 mg/dL Creatinine 0.44 - 1.00 mg/dL 4.09  8.11  9.14   Sodium 135 - 145 mmol/L 139  136  137   Potassium 3.5 - 5.1 mmol/L 3.5  3.7  3.6   Chloride 98 - 111 mmol/L 107  104  104   CO2 22 - 32 mmol/L Calcium 8.9 - 10.3 mg/dL 8.9  9.1  9.3    Blood Alcohol level:  Lab Results  Component Value Date    ETH <10 04/20/2022   Prolactin: No results found for: "PROLACTIN" Lipid Panel: Lab Results  Component Value Date   CHOL 180 04/20/2022   TRIG 88 04/20/2022   HDL 42 04/20/2022   CHOLHDL 4.3 04/20/2022   VLDL 18 04/20/2022   LDLCALC 120 (H) 04/20/2022  HbgA1c: Lab Results  Component Value Date   HGBA1C 4.7 (L) 04/20/2022   TSH: Lab Results  Component Value Date   TSH 1.707 04/20/2022    4. Group Therapy: -- Encouraged patient to participate in unit milieu and in scheduled group therapies  -- Short Term Goals: Ability to identify changes in lifestyle to reduce recurrence of condition will improve, Ability to verbalize feelings will improve, Ability to disclose and discuss suicidal ideas, Ability to demonstrate self-control will improve, Ability to identify and develop effective coping behaviors will improve, Ability to maintain clinical measurements within normal limits will improve, Compliance with prescribed medications will improve, and Ability to identify triggers associated with substance abuse/mental health issues will improve -- Long Term Goals: Improvement in symptoms so as ready for discharge -- Patient is encouraged to participate in group therapy while admitted to the psychiatric unit. -- We will address other chronic and acute stressors, which contributed to the patient's Brief psychotic disorder in order to reduce the risk of self-harm at discharge.  5. Discharge Planning:  -- Social work and case management to assist with discharge planning and identification of hospital follow-up needs prior to discharge -- Estimated LOS: Possible discharge by Wednesday, 04/28/2022 -- Discharge Concerns: Need to establish a safety plan; Medication compliance and effectiveness -- Discharge Goals: Return home with outpatient referrals for mental health follow-up including medication management/psychotherapy  I certify that inpatient services furnished can reasonably be expected to  improve the patient's condition.    I discussed my assessment, planned testing and intervention for the patient with Dr. Sherron Flemings who agrees with my formulated course of action.  Karie Fetch, MD, PGY-1 04/26/2022, 8:46 AM Patient ID: Christy [name], female   DOB: 1995/11/17, 27 y.o.   MRN: [id number]

## 2022-04-26 NOTE — Plan of Care (Signed)
  Problem: Activity: Goal: Interest or engagement in activities will improve Outcome: Progressing   Problem: Safety: Goal: Periods of time without injury will increase Outcome: Progressing   Problem: Education: Goal: Knowledge of the prescribed therapeutic regimen will improve Outcome: Progressing

## 2022-04-26 NOTE — Group Note (Signed)
Recreation Therapy Group Note   Group Topic:Team Building  Group Date: 04/26/2022 Start Time: 1011 End Time: 1054 Facilitators: Omarie Parcell-McCall, LRT,CTRS Location: [address] Dayroom   Goal Area(s) Addresses:  Patient will effectively work with peer towards shared goal.  Patient will identify skills used to make activity successful.  Patient will identify how skills used during activity can be used to reach post d/c goals.   Group Description: Straw Bridge. In teams of 3-5, patients were given 15 plastic drinking straws and an equal length of masking tape. Using the materials provided, patients were instructed to build a free standing bridge-like structure to suspend an everyday item (ex: puzzle box) off of the floor or table surface. All materials were required to be used by the team in their design. LRT facilitated post-activity discussion reviewing team process. Patients were encouraged to reflect how the skills used in this activity can be generalized to daily life post discharge.    Affect/Mood: N/A   Participation Level: Did not attend    Clinical Observations/Individualized Feedback:     Plan: Continue to engage patient in RT group sessions 2-3x/week.   Murtaza Shell-McCall, LRT,CTRS 04/26/2022 1:46 PM

## 2022-04-26 NOTE — Plan of Care (Signed)
  Problem: Education: Goal: Mental status will improve Outcome: Progressing Goal: Verbalization of understanding the information provided will improve Outcome: Progressing   Problem: Coping: Goal: Ability to verbalize frustrations and anger appropriately will improve Outcome: Progressing Goal: Ability to demonstrate self-control will improve Outcome: Progressing   

## 2022-04-26 NOTE — Progress Notes (Signed)
   04/26/22 0800  Psych Admission Type (Psych Patients Only)  Admission Status Involuntary  Psychosocial Assessment  Patient Complaints Anxiety;Suspiciousness  Eye Contact Brief  Facial Expression Flat  Affect Preoccupied;Appropriate to circumstance  Speech Soft  Interaction Cautious  Motor Activity Other (Comment) (WDL)  Appearance/Hygiene Unremarkable  Behavior Characteristics Guarded  Mood Anxious;Suspicious  Aggressive Behavior  Effect No apparent injury  Thought Process  Coherency Blocking  Content Paranoia  Delusions Paranoid  Perception WDL  Hallucination None reported or observed  Judgment Poor  Confusion None  Danger to Self  Current suicidal ideation? Denies  Agreement Not to Harm Self Yes  Description of Agreement Verbal  Danger to Others  Danger to Others None reported or observed

## 2022-04-26 NOTE — Progress Notes (Signed)
D- Patient alert and oriented.  Denies SI, HI, AVH, and pain. Patient attended evening group with encouragement.  A- Scheduled medications administered to patient along with PRN trazodone, per MAR. Support and encouragement provided.  Routine safety checks conducted every 15 minutes.  Patient informed to notify staff with problems or concerns.  R- No adverse drug reactions noted. Patient contracts for safety at this time. Patient compliant with medications and treatment plan. Patient receptive, calm, and cooperative. Patient interacts well with others on the unit.  Patient remains safe at this time.

## 2022-04-26 NOTE — Progress Notes (Signed)
Adult Psychoeducational Group Note  Date:  04/26/2022 Time:  4:30 PM  Group Topic/Focus:  Goals Group:   The focus of this group is to help patients establish daily goals to achieve during treatment and discuss how the patient can incorporate goal setting into their daily lives to aide in recovery. Orientation:   The focus of this group is to educate the patient on the purpose and policies of crisis stabilization and provide a format to answer questions about their admission.  The group details unit policies and expectations of patients while admitted.  Participation Level:  Did Not Attend  Participation Quality:    Affect:    Cognitive:    Insight:   Engagement in Group:    Modes of Intervention:    Additional Comments:  Pt did not attend group.  [name] 04/26/2022, 4:30 PM

## 2022-04-26 NOTE — BHH Group Notes (Signed)
Pt attended group discussion but refused to participate

## 2022-04-27 DIAGNOSIS — F23 Brief psychotic disorder: Secondary | ICD-10-CM | POA: Diagnosis not present

## 2022-04-27 NOTE — Progress Notes (Signed)
[facility] MD Resident Progress Note  04/27/2022 8:58 AM Christy [name]  MRN:  [id number] Principal Problem: Brief psychotic disorder Diagnosis: Principal Problem:   Brief psychotic disorder  Reason for admission   Christy [name] is a 27 y.o., female with no known past psychiatric history who presents to the [facility] involuntarily from [facility] Emergency Department for evaluation and management of acute psychosis  (admitted on 04/21/2022, total  LOS: 6 days )  Chart review from last 24 hours   The patient's chart was reviewed and nursing notes were reviewed. The patient's case was discussed in multidisciplinary team meeting.  -- Staff report the patient has been irritable with them.  - Patient took all scheduled ativan - Patient took PRN zyprexa zydis, trazodone   Yesterday, the psychiatry team made the following recommendations:  -- STOP zyprexa zydis 5mg  AM OR zyprexa IM 5mg  AM -- INCREASE zyprexa zydis 10mg  PM OR zyprexa IM 5mg   -- DECREASE ativan 0.5mg  Q12H PO THEN STOP (DO NOT ADMINISTER IM zyprexa within 2 hours of IM ativan administration)  -- CONTINUE zyprexa 5mg  IM TID PRN for agitation  -- CONTINUE zyprexa zydis 5mg  TID PRN for agitation  -- CONTINUE ativan 2mg  oral TID PRN for agitation   Per nursing in bed progression, patient is very irritable this AM. She refused to go to breakfast.   Information obtained during interview   The patient was seen and evaluated on the unit.  On assessment today the patient is lying in bed. She reports that she slept ok. She is guarded. She reports she was not hungry this AM. She denies side effects to the medicine. She denies SI. When asked questions about HI/AVH, patient stops talking, waited for patient to talk and patient states "I'm finna being treated like a tape recorder. I'm not going to keep on repeating myself." She asks about discharge and states "I'm tired of being lied to. I'm feeling irritable." Encouraged  patient to continue talking with provider. She then stops talking to this provider and walks out of the room to call her mom. On the phone with her mom, she repeats "I'm irritable because I'm finna being treated like a tape recorder here."   Patient endorses okay sleep.  Patient denied med side effects.   Review of Systems  Reports no new pain   Past Psychiatric History Previous Psych Diagnoses: Patient's mom reports none  Prior inpatient treatment: Patient's mom reports none  Current/prior outpatient treatment: Patient's mom reports none  Prior rehab hx: Patient's mom reports none  Psychotherapy hx: Patient's mom reports none  History of suicide: Patient's mom reports none  History of homicide: Patient's mom reports none  Psychiatric medication history: Patient's mom reports none  Psychiatric medication compliance history: not applicable  Neuromodulation history: Patient's mom reports none  Current Psychiatrist: Patient's mom reports none  Current therapist: Patient's mom reports none  Past Medical History Medical Diagnoses: Patient's mom reports none  Home Rx: Patient's mom reports none  Prior Hosp: Patient's mom reports none  Prior Surgeries/Trauma: Patient's mom reports none  Head trauma, LOC, concussions, seizures: none Allergies: Penicillin- rash, hives, anaphylaxis as a child  LMP: unable to assess  Contraception: Patient's mom reports none  PCP: none noted in chart review   Family History Medical: Mother - Hypertension, Maternal and paternal grandparents have hypertension and diabetes  Psych: Patient's mom reports none  Psych Rx: Not applicable  SA/HA: Patient's mom reports none  Substance use family hx: Maternal  grandmother smokes marijuana and cocaine, Maternal aunt has alcoholism, Paternal aunt has hx of cocaine use.   Social History  Childhood: Grew up in [place] until third grade. Moved to [place] in third grade.  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 rate 20, weight 63.9 kg, SpO2 96 %. Body mass index is 25.75 kg/m. Sleep: reports okay Current Medications: Current Facility-Administered Medications  Medication Dose Route Frequency Provider Last Rate Last Admin   acetaminophen (TYLENOL) tablet 650 mg  650 mg Oral Q6H PRN Motley-Mangrum, Jadeka A, PMHNP   650 mg at 04/23/22 2117   alum & mag hydroxide-simeth (MAALOX/MYLANTA) 200-200-20 MG/5ML suspension 30 mL  30 mL Oral Q4H PRN Motley-Mangrum, Jadeka A, PMHNP       hydrOXYzine (ATARAX) tablet 25 mg  25 mg Oral TID PRN Motley-Mangrum, Jadeka A, PMHNP   25 mg at 04/23/22 0820   LORazepam (ATIVAN) tablet 2 mg  2 mg Oral TID PRN Motley-Mangrum, Jadeka A, PMHNP       magnesium hydroxide (MILK OF MAGNESIA) suspension 30 mL  30 mL Oral Daily PRN Motley-Mangrum, Jadeka A, PMHNP       OLANZapine (ZYPREXA) injection 5 mg  5 mg Intramuscular TID PRN Karie Fetch, MD       OLANZapine zydis (ZYPREXA) disintegrating tablet 10 mg  10 mg Oral QHS Massengill, Harrold Donath, MD   10 mg at 04/26/22 2045   Or   OLANZapine (ZYPREXA) injection 5 mg  5 mg Intramuscular QHS Massengill, Nathan, MD       OLANZapine zydis (ZYPREXA) disintegrating tablet 5 mg  5 mg Oral TID PRN Karie Fetch, MD   5 mg at 04/26/22 0819   prenatal multivitamin tablet 1 tablet  1 tablet Oral Q1200 Motley-Mangrum, Jadeka A, PMHNP   1  tablet at 04/26/22 1244   traZODone (DESYREL) tablet 50 mg  50 mg Oral QHS PRN Motley-Mangrum, Jadeka A, PMHNP   50 mg at 04/26/22 2045   Physical Exam Constitutional:      Appearance: the patient is not toxic-appearing.  Pulmonary:     Effort: Pulmonary effort is normal.  Neurological:     Mental Status: the patient is alert  AIMS: No stiffness or cogwheeling noted in bilateral wrists or elbows   Mental Status Exam   General Appearance: appears at stated age  Behavior: guarded, irritable  Psychomotor Activity: No psychomotor retardation noted.   Eye Contact:  Speech: normal amount, tone, volume, latency  Mood: "I'm finna being treated like a tape recorder" Affect: flat   Thought Process: linear, logical  Descriptions of Associations: intact  Thought Content: Denies AVH.  Hallucinations: Denies AH, VH  Delusions: Does not appear to be responding to internal stimuli  Suicidal Thoughts: Denies SI, intention, plan  Homicidal Thoughts: Denies HI, intention, plan   Alertness/Orientation: alert, oriented to self  Insight: improving  Judgment: improving   Memory: fair  Executive Functions  Concentration: fair  Attention Span: fair Recall: fair Fund of Knowledge: fair   Assets: Leisure Time; Resilience  Treatment Plan Summary: Daily contact with patient to assess and evaluate symptoms and progress in treatment and Medication management Summary   Maie Kesinger is a 27 y.o., female with no known past psychiatric history who presents to the [facility] involuntarily from [facility] Emergency Department for evaluation and management of acute psychosis   Diagnoses / Active Problems: Brief psychotic disorder Principal Problem:   Brief psychotic disorder -Cannabis use disorder   Plan  Safety and Monitoring: -- Involuntary admission to inpatient psychiatric unit for safety, stabilization and treatment -- Daily contact with patient to assess and  evaluate symptoms and progress in treatment -- Patient's case to be discussed in multi-disciplinary team meeting -- Observation Level : q15 minute checks -- Vital signs:  q12 hours -- Precautions: suicide, elopement, and assault  2. Psychiatric Management:  #Brief psychotic disorder  #Catatonia  -- CONTINUE zyprexa zydis  PM OR zyprexa IM   -- CONTINUE zyprexa  IM TID PRN for agitation  -- CONTINUE zyprexa zydis  TID PRN for agitation  -- CONTINUE ativan  oral TID PRN for agitation   #Cannabis use -will encourage abstinence   The risks/benefits/side-effects/alternatives to this medication were discussed with the patient. The patient did not consent to medication trial. A second physician came to assess patient for forced medication order.   3. Medical Management  None  4. Routine and other pertinent labs: UDS+THC, CBC wnl, CMP wnl, ethanol<10, salicylate <7, tylenol<10, TSH wnl, LDL 120, A1c 4.7, beta hcg<5. CT head in ED was wnl. EKG Qtc 446.  Lab Results:     Latest Ref Rng & Units 04/20/2022    6:58 PM 01/28/2019    1:00 PM 11/29/2018    9:25 AM  CBC  WBC 4.0 - 10.5 K/uL 9.3  13.1  10.3   Hemoglobin 12.0 - 15.0 g/dL 57.8  46.9  62.9   Hematocrit 36.0 - 46.0 % 38.3  35.1  33.4   Platelets 150 - 400 K/uL 254  271  253       Latest Ref Rng & Units 04/20/2022    6:58 PM 01/28/2019    1:00 PM 07/18/2018    4:45 PM  BMP  Glucose 70 - 99 mg/dL 83  91  72   BUN 6 - 20 mg/dL Creatinine 0.44 - 1.00 mg/dL 5.28  4.13  2.44   Sodium 135 - 145 mmol/L 139  136  137   Potassium 3.5 - 5.1 mmol/L 3.5  3.7  3.6   Chloride 98 - 111 mmol/L 107  104  104   CO2 22 - 32 mmol/L Calcium 8.9 - 10.3 mg/dL 8.9  9.1  9.3    Blood Alcohol level:  Lab Results  Component Value Date   ETH <10 04/20/2022   Prolactin: No results found for: "PROLACTIN" Lipid Panel: Lab Results  Component Value Date   CHOL 180 04/20/2022   TRIG 88 04/20/2022   HDL 42  04/20/2022   CHOLHDL 4.3 04/20/2022   VLDL 18 04/20/2022   LDLCALC 120 (H) 04/20/2022   HbgA1c: Lab Results  Component Value Date   HGBA1C 4.7 (L) 04/20/2022   TSH: Lab Results  Component Value Date   TSH 1.707 04/20/2022    4. Group Therapy: -- Encouraged patient to participate  in unit milieu and in scheduled group therapies  -- Short Term Goals: Ability to identify changes in lifestyle to reduce recurrence of condition will improve, Ability to verbalize feelings will improve, Ability to disclose and discuss suicidal ideas, Ability to demonstrate self-control will improve, Ability to identify and develop effective coping behaviors will improve, Ability to maintain clinical measurements within normal limits will improve, Compliance with prescribed medications will improve, and Ability to identify triggers associated with substance abuse/mental health issues will improve -- Long Term Goals: Improvement in symptoms so as ready for discharge -- Patient is encouraged to participate in group therapy while admitted to the psychiatric unit. -- We will address other chronic and acute stressors, which contributed to the patient's Brief psychotic disorder in order to reduce the risk of self-harm at discharge.  5. Discharge Planning:  -- Social work and case management to assist with discharge planning and identification of hospital follow-up needs prior to discharge -- Estimated LOS: Possible discharge by Wednesday, 04/28/2022 -- Discharge Concerns: Need to establish a safety plan; Medication compliance and effectiveness -- Discharge Goals: Return home with outpatient referrals for mental health follow-up including medication management/psychotherapy  I certify that inpatient services furnished can reasonably be expected to improve the patient's condition.    I discussed my assessment, planned testing and intervention for the patient with Dr. Sherron Flemings who agrees with my formulated course of  action.  Karie Fetch, MD, PGY-1 04/27/2022, 8:58 AM Patient ID: Christy [name] Current, female   DOB: [birth year], 27 y.o.   MRN: [id number]

## 2022-04-27 NOTE — Group Note (Signed)
Recreation Therapy Group Note   Group Topic:Self-Esteem  Group Date: 04/27/2022 Start Time: 1039 End Time: 1140 Facilitators: Velma Agnes-McCall, LRT,CTRS Location: 500 [address] Dayroom   Goal Area(s) Addresses:  Patient will be able to define self esteem. Patient will successfully share how they see themselves. Patient will identify how self esteem can be helpful post d/c.     Group Description:  Mask Off.  LRT and patients discussed the importance of having a positive outlook and overview of ones self.  LRT and patients also discussed how the opinions of others can affect how we feel about ourselves.  Patients were then given blank faces of their choice.  Patients were to create and decorate the mask how they see themselves.  Patients were to highlight all the positive qualities using words and images they draw.    Affect/Mood: Appropriate   Participation Level: Minimal   Participation Quality: Independent   Behavior: Appropriate   Speech/Thought Process: Focused   Insight: Fair   Judgement: Fair    Modes of Intervention: Art and Music   Patient Response to Interventions:  Attentive   Education Outcome:  Acknowledges education and In group clarification offered    Clinical Observations/Individualized Feedback: Pt was quiet but would gradually engage when prompted.  Pt was appropriate during group session.  Pt didn't do the art activity.  Pt was attentive to the music during group session.     Plan: Continue to engage patient in RT group sessions 2-3x/week.   Christy [name], LRT,CTRS 04/27/2022 1:47 PM

## 2022-04-27 NOTE — Group Note (Signed)
Date:  04/27/2022 Time:  8:58 PM  Group Topic/Focus:  Wrap-Up Group:   The focus of this group is to help patients review their daily goal of treatment and discuss progress on daily workbooks.    Participation Level:  Did Not Attend  Scot Dock 04/27/2022, 8:58 PM

## 2022-04-27 NOTE — Progress Notes (Signed)
Adult Psychoeducational Group Note  Date:  04/27/2022 Time:  9:43 AM  Group Topic/Focus:  Goals Group:   The focus of this group is to help patients establish daily goals to achieve during treatment and discuss how the patient can incorporate goal setting into their daily lives to aide in recovery. Orientation:   The focus of this group is to educate the patient on the purpose and policies of crisis stabilization and provide a format to answer questions about their admission.  The group details unit policies and expectations of patients while admitted.  Participation Level:  Active  Participation Quality:  Appropriate  Affect:  Appropriate  Cognitive:  Appropriate  Insight: Appropriate  Engagement in Group:  Engaged  Modes of Intervention:  Discussion  Additional Comments:  Pt attended the goals/orientation group and remained appropriate and engaged throughout the duration of the group.   [name] 04/27/2022, 9:43 AM

## 2022-04-27 NOTE — Plan of Care (Signed)
  Problem: Coping: Goal: Ability to verbalize frustrations and anger appropriately will improve Outcome: Progressing Goal: Ability to demonstrate self-control will improve Outcome: Progressing   Problem: Education: Goal: Mental status will improve Outcome: Progressing Goal: Verbalization of understanding the information provided will improve Outcome: Progressing   Problem: Activity: Goal: Interest or engagement in activities will improve Outcome: Progressing

## 2022-04-27 NOTE — Progress Notes (Signed)
   04/27/22 0545  15 Minute Checks  Location Bedroom  Visual Appearance Calm  Behavior Sleeping  Sleep (Behavioral Health Patients Only)  Calculate sleep? (Click Yes once per 24 hr at 0600 safety check) Yes  Documented sleep last 24 hours 13.5

## 2022-04-27 NOTE — Progress Notes (Signed)
   04/27/22 0800  Psych Admission Type (Psych Patients Only)  Admission Status Involuntary  Psychosocial Assessment  Patient Complaints Anxiety;Suspiciousness  Eye Contact Brief  Facial Expression Flat  Affect Preoccupied;Apprehensive  Speech Soft  Interaction Cautious  Motor Activity Other (Comment) (WDL)  Appearance/Hygiene Unremarkable  Behavior Characteristics Anxious;Guarded  Mood Anxious;Suspicious  Aggressive Behavior  Effect No apparent injury  Thought Process  Coherency Blocking  Content Preoccupation  Delusions Paranoid  Perception WDL  Hallucination None reported or observed  Judgment Poor  Confusion None  Danger to Self  Current suicidal ideation? Denies  Agreement Not to Harm Self Yes  Description of Agreement In agreement with safety plan  Danger to Others  Danger to Others None reported or observed

## 2022-04-27 NOTE — Progress Notes (Signed)
   04/27/22 2200  Psych Admission Type (Psych Patients Only)  Admission Status Involuntary  Psychosocial Assessment  Patient Complaints Suspiciousness  Eye Contact Brief  Facial Expression Flat  Affect Apprehensive  Speech Soft  Interaction Cautious  Motor Activity Slow  Appearance/Hygiene Unremarkable  Behavior Characteristics Guarded  Mood Suspicious  Aggressive Behavior  Effect No apparent injury  Thought Process  Coherency Blocking  Content Preoccupation  Delusions Paranoid  Perception WDL  Hallucination None reported or observed  Judgment Poor  Confusion None  Danger to Self  Current suicidal ideation? Denies  Agreement Not to Harm Self Yes  Description of Agreement verbal  Danger to Others  Danger to Others None reported or observed

## 2022-04-28 ENCOUNTER — Encounter (HOSPITAL_COMMUNITY): Payer: Self-pay

## 2022-04-28 DIAGNOSIS — F1290 Cannabis use, unspecified, uncomplicated: Secondary | ICD-10-CM | POA: Insufficient documentation

## 2022-04-28 DIAGNOSIS — F23 Brief psychotic disorder: Secondary | ICD-10-CM | POA: Diagnosis not present

## 2022-04-28 MED ORDER — HYDROXYZINE HCL 25 MG PO TABS: 25.0000 mg | ORAL_TABLET | Freq: Three times a day (TID) | ORAL | 0 refills | Status: AC | PRN

## 2022-04-28 MED ORDER — OLANZAPINE 10 MG PO TABS: 10.0000 mg | ORAL_TABLET | Freq: Every day | ORAL | 0 refills | Status: DC

## 2022-04-28 MED ORDER — TRAZODONE HCL 50 MG PO TABS: 50.0000 mg | ORAL_TABLET | Freq: Every evening | ORAL | 0 refills | Status: DC | PRN

## 2022-04-28 NOTE — Progress Notes (Signed)
D: Pt A & O X 3. Denies SI, HI, AVH and pain at this time. Affect remains blunted with irritable mood and elective mutism with head nodes on interactions. D/C home as ordered. Picked up in lobby by her mother.  A: D/C instructions reviewed with pt and mother including electronic prescriptions, and follow up appointment at [facility]; compliance encouraged. All belongings from locker 30 returned to pt at time of departure. Scheduled  medications given with verbal education and effects monitored. Safety checks maintained without incident till time of d/c.  R: Pt receptive to care. Compliant with medications when offered. Denies adverse drug reactions when assessed. Verbalized understanding related to d/c instructions. Signed belonging sheet in agreement with items received from locker. Ambulatory with a steady gait. Appears to be in no physical distress at time of departure.

## 2022-04-28 NOTE — Progress Notes (Signed)
   04/28/22 0555  15 Minute Checks  Location Bedroom  Visual Appearance Calm  Behavior Sleeping  Sleep (Behavioral Health Patients Only)  Calculate sleep? (Click Yes once per 24 hr at 0600 safety check) Yes  Documented sleep last 24 hours 7

## 2022-04-28 NOTE — Group Note (Unsigned)
Date:  04/28/2022 Time:  9:19 AM  Group Topic/Focus:  Goals Group:   The focus of this group is to help patients establish daily goals to achieve during treatment and discuss how the patient can incorporate goal setting into their daily lives to aide in recovery. Orientation:   The focus of this group is to educate the patient on the purpose and policies of crisis stabilization and provide a format to answer questions about their admission.  The group details unit policies and expectations of patients while admitted.     Participation Level:  {BHH PARTICIPATION LEVEL:22264}  Participation Quality:  {BHH PARTICIPATION QUALITY:22265}  Affect:  {BHH AFFECT:22266}  Cognitive:  {BHH COGNITIVE:22267}  Insight: {BHH Insight2:20797}  Engagement in Group:  {BHH ENGAGEMENT IN GROUP:22268}  Modes of Intervention:  {BHH MODES OF INTERVENTION:22269}  Additional Comments:  ***  Jabin Tapp S Demetry Bendickson 04/28/2022, 9:19 AM  

## 2022-04-28 NOTE — Discharge Summary (Signed)
Physician Discharge Summary Note  Patient:  Christy [name] is an 27 y.o., female MRN:  [id number] DOB:  December 15, 1995 Patient phone:  [phone] (home)  Patient address:   [address] [place] [address],  Total Time spent with patient: 30 minutes  Date of Admission:  04/21/2022 Date of Discharge: 04/28/22  Reason for Admission:  Christy [name] is a 27 y.o., female with no known past psychiatric history who presents to the [facility] involuntarily from [facility] Emergency Department for evaluation and management of acute psychosis.   Principal Problem: Brief psychotic disorder Discharge Diagnoses: Principal Problem:   Brief psychotic disorder Active Problems:   Cannabis use disorder  Past Psychiatric History:  Previous Psych Diagnoses: Patient's mom reports none  Prior inpatient treatment: Patient's mom reports none  Current/prior outpatient treatment: Patient's mom reports none  Prior rehab hx: Patient's mom reports none  Psychotherapy hx: Patient's mom reports none  History of suicide: Patient's mom reports none  History of homicide: Patient's mom reports none  Psychiatric medication history: Patient's mom reports none  Psychiatric medication compliance history: not applicable  Neuromodulation history: Patient's mom reports none  Current Psychiatrist: Patient's mom reports none  Current therapist: Patient's mom reports none   Past Medical History:  Past Medical History:  Diagnosis Date   Anemia    Chlamydia 2018, 2019   Gestational diabetes    Gestational hypertension 01/29/2019   Gonorrhea 2018, 2020   Heartburn in pregnancy 11/29/2018   History of maternal chlamydia infection, currently pregnant 11/29/2018   TOC done 11-2018   Infection    UTI   Nausea and vomiting in pregnancy 11/29/2018   Normal labor 01/28/2019   Supervision of other normal pregnancy, antepartum 11/29/2018    Nursing Staff Provider Office  Location  [facility] Dating  LMP and 6wk [place] per patient report Language   Anatomy US  Requesting records Flu Vaccine  11/18 Genetic Screen  NIPS: low risk  AFP:   First Screen:  Quad:   TDaP vaccine   11/18 Hgb A1C or  GTT Early  Third trimester  Rhogam  11/18   LAB RESULTS  Feeding Plan  breast Blood Type --/--/A NEG, A NEG (04/15 1513)  Contraception  Depo Antibody NEG   SVD (spontaneous vaginal delivery) 01/29/2019  Medical Diagnoses: Patient's mom reports none  Home Rx: Patient's mom reports none  Prior Hosp: Patient's mom reports none  Prior Surgeries/Trauma: Patient's mom reports none  Head trauma, LOC, concussions, seizures: none Allergies: Penicillin- rash, hives, anaphylaxis as a child  LMP: unable to assess  Contraception: Patient's mom reports none  PCP: none noted in chart review    Past Surgical History:  Procedure Laterality Date   NO PAST SURGERIES     Family History:  Family History  Problem Relation Age of Onset   Hypertension Mother    Hypertension Father    Hypertension Maternal Grandmother    Family Psychiatric  History:  Medical: Mother - Hypertension, Maternal and paternal grandparents have hypertension and diabetes  Psych: Patient's mom reports none  Psych Rx: Not applicable  SA/HA: Patient's mom reports none  Substance use family hx: Maternal grandmother smokes marijuana and cocaine, Maternal aunt has alcoholism, Paternal aunt has hx of cocaine use.   Social History:  Social History   Substance and Sexual Activity  Alcohol Use No     Social History   Substance and Sexual Activity  Drug Use Not Currently   Types:  Marijuana   Comment: last smoked 2 days ago, est 3 x weekly    Social History   Socioeconomic History   Marital status: Single    Spouse name: Not on file   Number of children: Not on file   Years of education: Not on file   Highest education level: Not on file  Occupational History   Not on file  Tobacco Use   Smoking status: Never    Smokeless tobacco: Never  Vaping Use   Vaping Use: Never used  Substance and Sexual Activity   Alcohol use: No   Drug use: Not Currently    Types: Marijuana    Comment: last smoked 2 days ago, est 3 x weekly   Sexual activity: Not Currently    Birth control/protection: None  Other Topics Concern   Not on file  Social History Narrative   Not on file   Social Determinants of Health   Financial Resource Strain: Not on file  Food Insecurity: Unknown (04/21/2022)   Hunger Vital Sign    Worried About Running Out of Food in the Last Year: Patient declined    Ran Out of Food in the Last Year: Not on file  Transportation Needs: Patient Declined (04/21/2022)   PRAPARE - Administrator, Civil Service (Medical): Patient declined    Lack of Transportation (Non-Medical): Patient declined  Physical Activity: Not on file  Stress: Not on file  Social Connections: Not on file   Social History:  Childhood: Grew up in [place] until third grade. Moved to [place] in third grade.  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   Psychiatric diagnoses provided upon initial assessment:  -Brief Psychotic Disorder -Cannabis use disorder    Patient's psychiatric medications were adjusted on admission:  -- START zyprexa zydis 5mg  Q12H OR zyprexa IM 5mg  Q12H for  psychosis    During the hospitalization, other adjustments were made to the patient's psychiatric medication regimen:  -- INCREASE zyprexa zydis 5mg  AM, 10mg  PM OR zyprexa IM 5mg  AM, 10mg  PM for psychosis (forced medication 4/11) -- START ativan 1mg  Q8H PO to r/o catatonia (DO NOT ADMINISTER IM zyprexa within 2 hours of IM ativan administration)  -- DECREASE zyprexa zydis 5mg  PM OR zyprexa IM 5mg  PM for psychosis  -- DECREASE ativan 0.5mg  Q12H PO THEN STOP  -- STOP zyprexa zydis 5mg  AM OR zyprexa IM 5mg  AM -- INCREASE zyprexa zydis 10mg  PM OR zyprexa IM 5mg     During the hospitalization,  UDS+THC, CBC wnl, CMP wnl, ethanol<10, salicylate <7, tylenol<10, TSH wnl, LDL 120, A1c 4.7, beta hcg<5. CT head in the ED was wnl.    Patient's care was discussed during the interdisciplinary team meeting every day during the hospitalization.   The patient denied having side effects to prescribed psychiatric medication.   Gradually, patient started adjusting to milieu. The patient was evaluated each  day by a clinical provider to ascertain response to treatment. Improvement was noted by the patient's report of decreasing symptoms, improved sleep and appetite, affect, medication tolerance, behavior, and participation in unit programming.  Patient was asked each day to complete a self inventory noting mood, mental status, pain, new symptoms, anxiety and concerns.     Symptoms were reported as significantly decreased or resolved completely by discharge.    On day of discharge, the patient reports that their mood is stable. The patient denied having suicidal thoughts for more than 48 hours prior to discharge.  Patient denies having homicidal thoughts.  Patient denies having auditory hallucinations.  Patient denies any visual hallucinations or other symptoms of psychosis. The patient was motivated to continue taking medication with a goal of continued improvement in mental health. Patient reported that she was ready  to go and reports that she keeps on repeating herself like a tape recorder. She denies stiffness or adverse effects to the medicines. She reports she slept well and ate dinner yesterday.    The patient reports their target psychiatric symptoms of psychosis responded well to the psychiatric medications, and the patient reports overall benefit other psychiatric hospitalization. Supportive psychotherapy was provided to the patient. The patient also participated in regular group therapy while hospitalized. Coping skills, problem solving as well as relaxation therapies were also part of the unit programming.   Labs were reviewed with the patient, and abnormal results were discussed with the patient.   The patient is able to verbalize their individual safety plan to this provider.   Behavioral Events: none, she took the oral medication and did not require IM    Restraints: none   Groups: attended some groups. Attended 2/3 groups on day prior to discharge.     Medications Changes: as above   D/C Medications: zyprexa  nightly  # It is recommended to the patient to continue psychiatric medications as prescribed, after discharge from the hospital.    # It is recommended to the patient to follow up with your outpatient psychiatric provider and PCP.  # It was discussed with the patient, the impact of alcohol, drugs, tobacco have been there overall psychiatric and medical wellbeing, and total abstinence from substance use was recommended to the patient.  # Prescriptions provided or sent directly to preferred pharmacy at discharge. Patient agreeable to plan. Given opportunity to ask questions. Appears to feel comfortable with discharge.    # In the event of worsening symptoms, the patient is instructed to call the crisis hotline, 911 and or go to the nearest ED for appropriate evaluation and treatment of symptoms. To follow-up with primary care provider for other medical issues, concerns and or  health care needs  # Patient was discharged home with a plan to follow up as noted below.   Physical Findings: AIMS: No stiffness or cogwheeling noted in bilateral wrists prior to discharge.   Musculoskeletal: Strength & Muscle Tone: within normal limits Gait & Station: normal Patient leans: N/A  Psychiatric Specialty Exam General Appearance: appears at stated age, fairly dressed and groomed   Behavior: irritable   Psychomotor Activity:No psychomotor agitation or retardation noted    Eye Contact: fair Speech: normal amount, tone, volume and latency     Mood: ""I'm going to be like a tape recorder. I already said I'm doing good. My mom can come get me." Affect: restricted    Thought Process: linear, goal directed Descriptions of Associations: intact Thought Content: Hallucinations: Denies AH, VH  Delusions: Denies Paranoia  Suicidal Thoughts: Denies SI, intention, plan  Homicidal Thoughts: Denies HI, intention, plan    Alertness/Orientation: alert and oriented to self, situation    Insight: fair Judgment: fair   Memory: limited    Executive Functions  Concentration: intact Attention Span: fair Recall: intact Fund of Knowledge: fair   Assets  Assets: Leisure Time; Resilience  Sleep Sleep:Sleep: Fair  Physical Exam: Constitutional:      Appearance: the patient is not toxic-appearing.  Pulmonary:     Effort: Pulmonary effort is normal.  Neurological:     General: No focal deficit present.     Mental Status: the patient is alert, irritable and oriented to situation.    Review of Systems  Respiratory:  Negative for shortness of breath.   Cardiovascular:  Negative for chest pain.  Gastrointestinal:  Negative for abdominal pain, constipation, diarrhea, nausea and vomiting.  Neurological:  Negative for headaches.   Blood pressure 122/82, pulse 85, temperature 98.6 F (37 C), temperature source Oral, resp. rate 20, weight 63.9 kg, SpO2 96 %. Body mass  index is 25.75 kg/m.   Social History   Tobacco Use  Smoking Status Never  Smokeless Tobacco Never   Tobacco Cessation:  N/A, patient does not currently use tobacco products  Blood Alcohol level:  Lab Results  Component Value Date   ETH <10 04/20/2022    Metabolic Disorder Labs:  Lab Results  Component Value Date   HGBA1C 4.7 (L) 04/20/2022   MPG 88.19 04/20/2022   No results found for: "PROLACTIN" Lab Results  Component Value Date   CHOL 180 04/20/2022   TRIG 88 04/20/2022   HDL 42 04/20/2022   CHOLHDL 4.3 04/20/2022   VLDL 18 04/20/2022   LDLCALC 120 (H) 04/20/2022    Discharge destination:  Home  Is patient on multiple antipsychotic therapies at discharge:  No   Has Patient had three or more failed trials of antipsychotic monotherapy by history:  No  Recommended Plan for Multiple Antipsychotic Therapies: NA  Discharge Instructions     Call MD for:  difficulty breathing, headache or visual disturbances   Complete by: As directed    Call MD for:  extreme fatigue   Complete by: As directed    Call MD for:  hives   Complete by: As directed    Call MD for:  persistant dizziness or light-headedness   Complete by: As directed    Call MD for:  persistant nausea and vomiting   Complete by: As directed    Call MD for:  redness, tenderness, or signs of infection (pain, swelling, redness, odor or green/yellow discharge around incision site)   Complete by: As directed    Call MD for:  severe uncontrolled pain   Complete by: As directed    Call MD for:  temperature >100.4   Complete by: As directed    Diet - low sodium heart healthy   Complete by: As directed    Increase activity slowly   Complete by: As directed       Allergies as of 04/28/2022       Reactions   Penicillins Hives   DID THE REACTION INVOLVE: Swelling of the face/tongue/throat, SOB, or low BP? Yes Sudden or severe rash/hives, skin peeling, or the inside of the mouth or nose? Yes Did it  require medical treatment? No When did it last happen?      2019 If all above answers are "NO", may proceed with cephalosporin use. DID  THE REACTION INVOLVE: Swelling of the face/tongue/throat, SOB, or low BP? Yes  Sudden or severe rash/hives, skin peeling, or the inside of the mouth or nose? Yes  Did it require medical treatment? No  When did it last happen?      2019  If all above answers are "NO", may proceed with cephalosporin use.  DID THE REACTION INVOLVE: Swelling of the face/tongue/throat, SOB, or low BP? Yes  Sudden or severe rash/hives, skin peeling, or the inside of the mouth or nose? Yes  Did it require medical treatment? No  When did it last happen?      2019  If all above answers are "NO", may proceed with cephalosporin use. DID THE REACTION INVOLVE: Swelling of the face/tongue/throat, SOB, or low BP? Yes, Sudden or severe rash/hives, skin peeling, or the inside of the mouth or nose? Yes, Did it require medical treatment? No, When did it last happen?      2019, If all above answers are "NO", may proceed with cephalosporin use.        Medication List     TAKE these medications      Indication  hydrOXYzine 25 MG tablet Commonly known as: ATARAX Take 1 tablet (25 mg total) by mouth 3 (three) times daily as needed for anxiety.  Indication: Feeling Anxious   OLANZapine 10 MG tablet Commonly known as: ZyPREXA Take 1 tablet (10 mg total) by mouth at bedtime.  Indication: Brief psychotic disorder   traZODone 50 MG tablet Commonly known as: DESYREL Take 1 tablet (50 mg total) by mouth at bedtime as needed for sleep.  Indication: Trouble Sleeping        Follow-up Information     [facility]. Go to.   Specialty: Behavioral Health Why: For fastest service, please go to this provider for an in-person assessment, to obtain therapy and medication management services on Monday through Friday, arrive by 7:20 am. Assessments are made on a first  come, first served basis each day. Contact information: [phone] [place] [address] [phone]                Discharge recommendations:  Activity: as tolerated   Diet: heart healthy   # It is recommended to the patient to continue psychiatric medications as prescribed, after discharge from the hospital.     # It is recommended to the patient to follow up with your outpatient psychiatric provider -instructions on appointment date, time, and address (location) are provided to you in discharge paperwork   # Follow-up with outpatient primary care doctor and other specialists -for management of chronic medical disease, including: N/A   # Testing: Follow-up with outpatient provider for abnormal lab results:  -LDL 120    # It was discussed with the patient, the impact of alcohol, drugs, tobacco have been there overall psychiatric and medical wellbeing, and total abstinence from substance use was recommended to the patient.   # Prescriptions provided or sent directly to preferred pharmacy at discharge. Patient agreeable to plan. Given opportunity to ask questions. Appears to feel comfortable with discharge.    # In the event of worsening symptoms, the patient is instructed to call the crisis hotline, 911 and or go to the nearest ED for appropriate evaluation and treatment of symptoms. To follow-up with primary care provider for other medical issues, concerns and or health care needs  Patient agrees with D/C instructions and plan.   Total Time Spent in Direct Patient  Care:  I personally spent 30 minutes on the unit in direct patient care. The direct patient care time included face-to-face time with the patient, reviewing the patient's chart, communicating with other professionals, and coordinating care. Greater than 50% of this time was spent in counseling or coordinating care with the patient regarding goals of hospitalization, psycho-education, and discharge planning  needs.   Signed: Karie Fetch, MD, PGY-1 04/28/2022, 11:12 AM

## 2022-04-28 NOTE — [facility] Suicide Risk Assessment (Addendum)
[facility] Discharge Suicide Risk Assessment  Principal Problem: Brief psychotic disorder Discharge Diagnoses: Principal Problem:   Brief psychotic disorder Active Problems:   Cannabis use disorder  Reason for admission: Christy [name] is a 27 y.o., female with no known past psychiatric history who presents to the [facility] involuntarily from [facility] Emergency Department for evaluation and management of acute psychosis.   PTA Medications: None  Hospital Course:   During the patient's hospitalization, patient had extensive initial psychiatric evaluation, and follow-up psychiatric evaluations every day.  Psychiatric diagnoses provided upon initial assessment:  -Brief Psychotic Disorder -Cannabis use disorder   Patient's psychiatric medications were adjusted on admission:  -- START zyprexa zydis  Q12H OR zyprexa IM  Q12H for psychosis   During the hospitalization, other adjustments were made to the patient's psychiatric medication regimen:  -- INCREASE zyprexa zydis  AM,  PM OR zyprexa IM  AM,  PM for psychosis (forced medication 4/11) -- START ativan  Q8H PO to r/o catatonia (DO NOT ADMINISTER IM zyprexa within 2 hours of IM ativan administration)  -- DECREASE zyprexa zydis  PM OR zyprexa IM  PM for psychosis  -- DECREASE ativan 0.5mg  Q12H PO THEN STOP  -- STOP zyprexa zydis  AM OR zyprexa IM  AM -- INCREASE zyprexa zydis  PM OR zyprexa IM    During the hospitalization,  UDS+THC, CBC wnl, CMP wnl, ethanol<10, salicylate <7, tylenol<10, TSH wnl, LDL 120, A1c 4.7, beta hcg<5. CT head in the ED was wnl.   Patient's care was discussed during the interdisciplinary team meeting every day during the hospitalization.  The patient denied having side effects to prescribed psychiatric medication.  Gradually, patient started adjusting to milieu. The patient was evaluated each day by a clinical provider to ascertain response to  treatment. Improvement was noted by the patient's report of decreasing symptoms, improved sleep and appetite, affect, medication tolerance, behavior, and participation in unit programming.  Patient was asked each day to complete a self inventory noting mood, mental status, pain, new symptoms, anxiety and concerns.    Symptoms were reported as significantly decreased or resolved completely by discharge.   On day of discharge, the patient reports that their mood is stable. The patient denied having suicidal thoughts for more than 48 hours prior to discharge.  Patient denies having homicidal thoughts.  Patient denies having auditory hallucinations.  Patient denies any visual hallucinations or other symptoms of psychosis. The patient was motivated to continue taking medication with a goal of continued improvement in mental health. Patient reported that she was ready to go and reports that she keeps on repeating herself like a tape recorder. She denies stiffness or adverse effects to the medicines. She reports she slept well and ate dinner yesterday.   The patient reports their target psychiatric symptoms of psychosis responded well to the psychiatric medications, and the patient reports overall benefit other psychiatric hospitalization. Supportive psychotherapy was provided to the patient. The patient also participated in regular group therapy while hospitalized. Coping skills, problem solving as well as relaxation therapies were also part of the unit programming.  Labs were reviewed with the patient, and abnormal results were discussed with the patient.  The patient is able to verbalize their individual safety plan to this provider.  Behavioral Events: none, she took the oral medication and did not require IM   Restraints: none  Groups: attended some groups. Attended 2/3 groups on day prior to discharge.    Medications Changes: as above  D/C Medications: zyprexa  nightly   Sleep  Sleep: 7  hours   Musculoskeletal: Strength & Muscle Tone: within normal limits Gait & Station: normal Patient leans: N/A  Psychiatric Specialty Exam  General Appearance: appears at stated age, fairly dressed and groomed  Behavior: irritable  Psychomotor Activity:No psychomotor agitation or retardation noted   Eye Contact: fair Speech: normal amount, tone, volume and latency   Mood: ""I'm going to be like a tape recorder. I already said I'm doing good. My mom can come get me." Affect: restricted   Thought Process: linear, goal directed Descriptions of Associations: intact Thought Content: Hallucinations: Denies AH, VH  Delusions: Denies Paranoia  Suicidal Thoughts: Denies SI, intention, plan  Homicidal Thoughts: Denies HI, intention, plan   Alertness/Orientation: alert and oriented to self, situation   Insight: fair Judgment: fair  Memory: limited   Executive Functions  Concentration: intact Attention Span: fair Recall: intact Fund of Knowledge: fair  Assets  Assets: Leisure Time; Resilience   Physical Exam: Constitutional:      Appearance: the patient is not toxic-appearing.  Pulmonary:     Effort: Pulmonary effort is normal.  Neurological:     General: No focal deficit present.     Mental Status: the patient is alert, irritable and oriented to situation.   Review of Systems  Respiratory:  Negative for shortness of breath.   Cardiovascular:  Negative for chest pain.  Gastrointestinal:  Negative for abdominal pain, constipation, diarrhea, nausea and vomiting.  Neurological:  Negative for headaches.   Blood pressure 122/82, pulse 85, temperature 98.6 F (37 C), temperature source Oral, resp. rate 20, weight 63.9 kg, SpO2 96 %. Body mass index is 25.75 kg/m.  Mental Status Per Nursing Assessment::   On Admission:  Self-harm behaviors  Demographic Factors:  Adolescent or young adult, Low socioeconomic status, and Unemployed  Loss Factors: Loss of  significant relationship, Legal issues, and Financial problems/change in socioeconomic status  Historical Factors: Impulsivity  Risk Reduction Factors:   Responsible for children under 90 years of age, Sense of responsibility to family, and Positive social support  Continued Clinical Symptoms:  Alcohol/Substance Abuse/Dependencies  Cognitive Features That Contribute To Risk:  Thought constriction (tunnel vision)    Suicide Risk:  Mild: There are no identifiable plans, no associated intent, mild dysphoria and related symptoms, good self-control (both objective and subjective assessment), few other risk factors, and identifiable protective factors, including available and accessible social support.   Follow-up Information     [facility]. Go to.   Specialty: Behavioral Health Why: For fastest service, please go to this provider for an in-person assessment, to obtain therapy and medication management services on Monday through Friday, arrive by 7:20 am. Assessments are made on a first come, first served basis each day. Contact information: [address] [place] [address] [phone]                Discharge recommendations:    Activity: as tolerated  Diet: heart healthy  # It is recommended to the patient to continue psychiatric medications as prescribed, after discharge from the hospital.     # It is recommended to the patient to follow up with your outpatient psychiatric provider -instructions on appointment date, time, and address (location) are provided to you in discharge paperwork  # Follow-up with outpatient primary care doctor and other specialists -for management of chronic medical disease, including: N/A  # Testing: Follow-up with outpatient provider for abnormal lab results:  -  LDL 120    # It was discussed with the patient, the impact of alcohol, drugs, tobacco have been there overall psychiatric and medical  wellbeing, and total abstinence from substance use was recommended to the patient.   # Prescriptions provided or sent directly to preferred pharmacy at discharge. Patient agreeable to plan. Given opportunity to ask questions. Appears to feel comfortable with discharge.    # In the event of worsening symptoms, the patient is instructed to call the crisis hotline, 911 and or go to the nearest ED for appropriate evaluation and treatment of symptoms. To follow-up with primary care provider for other medical issues, concerns and or health care needs  Patient agrees with D/C instructions and plan.   Total Time Spent in Direct Patient Care:  I personally spent 30 minutes on the unit in direct patient care. The direct patient care time included face-to-face time with the patient, reviewing the patient's chart, communicating with other professionals, and coordinating care. Greater than 50% of this time was spent in counseling or coordinating care with the patient regarding goals of hospitalization, psycho-education, and discharge planning needs.   Karie Fetch, MD, PGY-1 04/28/2022, 10:56 AM

## 2022-04-28 NOTE — Discharge Instructions (Signed)
-  Follow-up with your outpatient psychiatric provider -instructions on appointment date, time, and address (location) are provided to you in discharge paperwork.  -Take your psychiatric medications as prescribed at discharge - instructions are provided to you in the discharge paperwork  -Follow-up with outpatient primary care doctor and other specialists -for management of preventative medicine and any chronic medical disease.  -Recommend abstinence from alcohol, tobacco, and other illicit drug use at discharge.   -If your psychiatric symptoms recur, worsen, or if you have side effects to your psychiatric medications, call your outpatient psychiatric provider, 911, [phone] or go to the nearest emergency department.  -If suicidal thoughts occur, call your outpatient psychiatric provider, 911, [phone] or go to the nearest emergency department.  Naloxone (Narcan) can help reverse an overdose when given to the victim quickly.  [place] offers free naloxone kits and instructions/training on its use.  Add naloxone to your first aid kit and you can help save a life.   Pick up your free kit at the following locations:   [place]:  [facility], [address] [place] [address] ([phone]) [facility] [address] [place] [id number] ([phone]) [facility] [address] [place] [address]  [place]: [facility] [address] [place] [address] ([phone]) [facility] [address] [place] [address] ([phone])  

## 2022-04-28 NOTE — Group Note (Signed)
Recreation Therapy Group Note   Group Topic:Personal Development  Group Date: 04/28/2022 Start Time: 1011 End Time: 1051 Facilitators: Marjorie Lussier-McCall, LRT,CTRS Location: [address] Dayroom   Goal Area(s) Addresses:  Patient will successfully identify positive attributes about themselves.  Patient will identify healthy ways to increase personal development. Patient will acknowledge benefit(s) of developing their character.   Group Description:  [place].  LRT and patients played a game in the same style as [place].  Except in this game, each block was a different color (red, orange, green and blue).  Each colored corresponds with a question.  Which ever color patient picked, they were asked a question that matched the color.    Affect/Mood: N/A   Participation Level: Did not attend    Clinical Observations/Individualized Feedback:     Plan: Continue to engage patient in RT group sessions 2-3x/week.   Christy [name], LRT,CTRS 04/28/2022 1:14 PM

## 2022-04-28 NOTE — Progress Notes (Signed)
  [facility] Adult Case Management Discharge Plan :  Will you be returning to the same living situation after discharge:  Yes,  Patient to return to place of residence.  At discharge, do you have transportation home?: Yes,  Patient's mother to provide transportation from hospital at 1800  Do you have the ability to pay for your medications: Yes,  Medicaid  Release of information consent forms completed and in the chart;  Patient's signature needed at discharge.  Patient to Follow up at:  Follow-up Information     [facility]. Go to.   Specialty: Behavioral Health Why: For fastest service, please go to this provider for an in-person assessment, to obtain therapy and medication management services on Monday through Friday, arrive by 7:20 am. Assessments are made on a first come, first served basis each day. Contact information: [address] [place] [address] [phone]                Next level of care provider has access to [facility] Link:yes  Safety Planning and Suicide Prevention discussed: Yes,  SPE completed with patient by nursing staff; patient declined consent for CSW to reach family/friend.    Has patient been referred to the Quitline?: N/A patient is not a smoker Tobacco Use: Low Risk  (04/21/2022)   Patient History    Smoking Tobacco Use: Never    Smokeless Tobacco Use: Never    Passive Exposure: Not on file   Patient has been referred for addiction treatment: Pt. refused referral Social History   Substance and Sexual Activity  Drug Use Not Currently   Types: Marijuana   Comment: last smoked 2 days ago, est 3 x weekly    Social History   Substance and Sexual Activity  Alcohol Use No   Corky Crafts, LCSWA 04/28/2022, 12:35 PM

## 2022-04-28 NOTE — [facility] Suicide Risk Assessment (Signed)
[facility] INPATIENT:  Family/Significant Other Suicide Prevention Education  Suicide Prevention Education:  Patient Refusal for Family/Significant Other Suicide Prevention Education: The patient Christy [name] has refused to provide written consent for family/significant other to be provided Family/Significant Other Suicide Prevention Education during admission and/or prior to discharge.  Physician notified.  Corky Crafts 04/28/2022, 12:47 PM

## 2022-04-28 NOTE — BH IP Treatment Plan (Signed)
Interdisciplinary Treatment and Diagnostic Plan Update  04/28/2022 Time of Session: 9:40 AM (UPDATE ) Christy [name] MRN: [id number]  Principal Diagnosis: Brief psychotic disorder  Secondary Diagnoses: Principal Problem:   Brief psychotic disorder Active Problems:   Cannabis use disorder   Current Medications:  Current Facility-Administered Medications  Medication Dose Route Frequency Provider Last Rate Last Admin   acetaminophen (TYLENOL) tablet 650 mg  650 mg Oral Q6H PRN Motley-Mangrum, Jadeka A, PMHNP   650 mg at 04/27/22 1313   alum & mag hydroxide-simeth (MAALOX/MYLANTA) 200-200-20 MG/5ML suspension 30 mL  30 mL Oral Q4H PRN Motley-Mangrum, Jadeka A, PMHNP       hydrOXYzine (ATARAX) tablet 25 mg  25 mg Oral TID PRN Motley-Mangrum, Jadeka A, PMHNP   25 mg at 04/23/22 0820   LORazepam (ATIVAN) tablet 2 mg  2 mg Oral TID PRN Motley-Mangrum, Jadeka A, PMHNP       magnesium hydroxide (MILK OF MAGNESIA) suspension 30 mL  30 mL Oral Daily PRN Motley-Mangrum, Jadeka A, PMHNP       OLANZapine (ZYPREXA) injection 5 mg  5 mg Intramuscular TID PRN Karie Fetch, MD       OLANZapine zydis (ZYPREXA) disintegrating tablet 10 mg  10 mg Oral QHS Massengill, Harrold Donath, MD   10 mg at 04/27/22 2041   Or   OLANZapine (ZYPREXA) injection 5 mg  5 mg Intramuscular QHS Massengill, Nathan, MD       OLANZapine zydis (ZYPREXA) disintegrating tablet 5 mg  5 mg Oral TID PRN Karie Fetch, MD   5 mg at 04/26/22 0819   prenatal multivitamin tablet 1 tablet  1 tablet Oral Q1200 Motley-Mangrum, Jadeka A, PMHNP   1 tablet at 04/28/22 1316   traZODone (DESYREL) tablet 50 mg  50 mg Oral QHS PRN Motley-Mangrum, Jadeka A, PMHNP   50 mg at 04/26/22 2045   PTA Medications: No medications prior to admission.    Patient Stressors: Financial difficulties   Marital or family conflict   Substance abuse    Patient Strengths: Supportive family/friends   Treatment Modalities: Medication Management, Group  therapy, Case management,  1 to 1 session with clinician, Psychoeducation, Recreational therapy.   Physician Treatment Plan for Primary Diagnosis: Brief psychotic disorder Long Term Goal(s): Improvement in symptoms so as ready for discharge   Short Term Goals: Ability to identify changes in lifestyle to reduce recurrence of condition will improve Ability to verbalize feelings will improve Ability to disclose and discuss suicidal ideas Ability to demonstrate self-control will improve Ability to identify and develop effective coping behaviors will improve Ability to maintain clinical measurements within normal limits will improve Compliance with prescribed medications will improve Ability to identify triggers associated with substance abuse/mental health issues will improve  Medication Management: Evaluate patient's response, side effects, and tolerance of medication regimen.  Therapeutic Interventions: 1 to 1 sessions, Unit Group sessions and Medication administration.  Evaluation of Outcomes: Progressing  Physician Treatment Plan for Secondary Diagnosis: Principal Problem:   Brief psychotic disorder Active Problems:   Cannabis use disorder  Long Term Goal(s): Improvement in symptoms so as ready for discharge   Short Term Goals: Ability to identify changes in lifestyle to reduce recurrence of condition will improve Ability to verbalize feelings will improve Ability to disclose and discuss suicidal ideas Ability to demonstrate self-control will improve Ability to identify and develop effective coping behaviors will improve Ability to maintain clinical measurements within normal limits will improve Compliance with prescribed medications will improve Ability to identify triggers  associated with substance abuse/mental health issues will improve     Medication Management: Evaluate patient's response, side effects, and tolerance of medication regimen.  Therapeutic Interventions: 1 to  1 sessions, Unit Group sessions and Medication administration.  Evaluation of Outcomes: Progressing   RN Treatment Plan for Primary Diagnosis: Brief psychotic disorder Long Term Goal(s): Knowledge of disease and therapeutic regimen to maintain health will improve  Short Term Goals: Ability to remain free from injury will improve, Ability to verbalize frustration and anger appropriately will improve, Ability to participate in decision making will improve, Ability to verbalize feelings will improve, Ability to identify and develop effective coping behaviors will improve, and Compliance with prescribed medications will improve  Medication Management: RN will administer medications as ordered by provider, will assess and evaluate patient's response and provide education to patient for prescribed medication. RN will report any adverse and/or side effects to prescribing provider.  Therapeutic Interventions: 1 on 1 counseling sessions, Psychoeducation, Medication administration, Evaluate responses to treatment, Monitor vital signs and CBGs as ordered, Perform/monitor CIWA, COWS, AIMS and Fall Risk screenings as ordered, Perform wound care treatments as ordered.  Evaluation of Outcomes: Progressing   LCSW Treatment Plan for Primary Diagnosis: Brief psychotic disorder Long Term Goal(s): Safe transition to appropriate next level of care at discharge, Engage patient in therapeutic group addressing interpersonal concerns.  Short Term Goals: Engage patient in aftercare planning with referrals and resources, Increase social support, Increase ability to appropriately verbalize feelings, Increase emotional regulation, Facilitate acceptance of mental health diagnosis and concerns, Facilitate patient progression through stages of change regarding substance use diagnoses and concerns, Identify triggers associated with mental health/substance abuse issues, and Increase skills for wellness and recovery  Therapeutic  Interventions: Assess for all discharge needs, 1 to 1 time with Social worker, Explore available resources and support systems, Assess for adequacy in community support network, Educate family and significant other(s) on suicide prevention, Complete Psychosocial Assessment, Interpersonal group therapy.  Evaluation of Outcomes: Progressing  Progress in Treatment: Attending groups: No. Participating in groups: No. Taking medication as prescribed: No. Toleration medication: No. Family/Significant other contact made: No, will contact:  CSW can't even get an assessment out of patient  Patient understands diagnosis: No. Discussing patient identified problems/goals with staff: No. Medical problems stabilized or resolved: Yes. Denies suicidal/homicidal ideation: Yes. Pt is not speaking much very limited  Issues/concerns per patient self-inventory: No.     New problem(s) identified: No, Describe:  None reported    New Short Term/Long Term Goal(s): medication stabilization, elimination of SI thoughts, development of comprehensive mental wellness plan.     Patient Goals:  REFUSED TO GIVE GOAL    Discharge Plan or Barriers: Patient recently admitted. CSW will continue to follow and assess for appropriate referrals and possible discharge planning.     Reason for Continuation of Hospitalization: Aggression Anxiety Depression Medication stabilization   Estimated Length of Stay: PATIENT IS DC THIS EVENING WITH MOM Last 3 [place] Suicide Severity Risk Score: Flowsheet Row [facility]20/[facility] in [facility] at [facility]  C-SSRS RISK CATEGORY No Risk No Risk       Last PHQ 2/9 Scores:     No data to display          Scribe for Treatment Team: [name], Theresia Majors 04/28/2022 1:19 PM

## 2022-08-12 ENCOUNTER — Other Ambulatory Visit: Payer: Self-pay

## 2022-08-12 ENCOUNTER — Emergency Department (HOSPITAL_COMMUNITY)
Admission: EM | Admit: 2022-08-12 | Discharge: 2022-08-12 | Payer: Medicaid Other | Attending: Emergency Medicine | Admitting: Emergency Medicine

## 2022-08-12 ENCOUNTER — Ambulatory Visit (HOSPITAL_COMMUNITY)
Admission: EM | Admit: 2022-08-12 | Discharge: 2022-08-17 | Disposition: A | Discharge status: Other Acute Inpatient Hospital | Payer: Medicaid Other | Source: Home / Self Care

## 2022-08-12 ENCOUNTER — Ambulatory Visit (HOSPITAL_COMMUNITY)
Admission: EM | Admit: 2022-08-12 | Discharge: 2022-08-12 | Disposition: A | Discharge status: Other Acute Inpatient Hospital | Payer: Medicaid Other | Source: Home / Self Care

## 2022-08-12 ENCOUNTER — Encounter (HOSPITAL_COMMUNITY): Payer: Self-pay

## 2022-08-12 DIAGNOSIS — F259 Schizoaffective disorder, unspecified: Secondary | ICD-10-CM | POA: Diagnosis not present

## 2022-08-12 DIAGNOSIS — Z91148 Patient's other noncompliance with medication regimen for other reason: Secondary | ICD-10-CM | POA: Insufficient documentation

## 2022-08-12 DIAGNOSIS — F319 Bipolar disorder, unspecified: Secondary | ICD-10-CM | POA: Insufficient documentation

## 2022-08-12 DIAGNOSIS — F1290 Cannabis use, unspecified, uncomplicated: Secondary | ICD-10-CM

## 2022-08-12 DIAGNOSIS — F29 Unspecified psychosis not due to a substance or known physiological condition: Secondary | ICD-10-CM | POA: Insufficient documentation

## 2022-08-12 DIAGNOSIS — Z560 Unemployment, unspecified: Secondary | ICD-10-CM | POA: Insufficient documentation

## 2022-08-12 DIAGNOSIS — Z1152 Encounter for screening for COVID-19: Secondary | ICD-10-CM | POA: Insufficient documentation

## 2022-08-12 DIAGNOSIS — F2081 Schizophreniform disorder: Secondary | ICD-10-CM | POA: Insufficient documentation

## 2022-08-12 LAB — COMPREHENSIVE METABOLIC PANEL
ALT: 57 U/L — ABNORMAL HIGH (ref 0–44)
ALT: 13 U/L (ref 0–44)
AST: 78 U/L — ABNORMAL HIGH (ref 15–41)
AST: 15 U/L (ref 15–41)
Albumin: 3.6 g/dL (ref 3.5–5.0)
Albumin: 4.3 g/dL (ref 3.5–5.0)
Alkaline Phosphatase: 68 U/L (ref 38–126)
Alkaline Phosphatase: 39 U/L (ref 38–126)
Anion gap: 14 (ref 5–15)
Anion gap: 9 (ref 5–15)
BUN: 19 mg/dL (ref 6–20)
BUN: 12 mg/dL (ref 6–20)
CO2: 24 mmol/L (ref 22–32)
CO2: 21 mmol/L — ABNORMAL LOW (ref 22–32)
Calcium: 8.5 mg/dL — ABNORMAL LOW (ref 8.9–10.3)
Calcium: 9 mg/dL (ref 8.9–10.3)
Chloride: 102 mmol/L (ref 98–111)
Chloride: 105 mmol/L (ref 98–111)
Creatinine, Ser: 0.96 mg/dL (ref 0.44–1.00)
Creatinine, Ser: 0.76 mg/dL (ref 0.44–1.00)
GFR, Estimated: >60 mL/min (ref >60)
GFR, Estimated: >60 mL/min (ref >60)
Glucose, Bld: 160 mg/dL — ABNORMAL HIGH (ref 70–99)
Glucose, Bld: 80 mg/dL (ref 70–99)
Potassium: 3.4 mmol/L — ABNORMAL LOW (ref 3.5–5.1)
Potassium: 3.8 mmol/L (ref 3.5–5.1)
Sodium: 140 mmol/L (ref 135–145)
Sodium: 135 mmol/L (ref 135–145)
Total Bilirubin: 0.5 mg/dL (ref 0.3–1.2)
Total Bilirubin: 0.9 mg/dL (ref 0.3–1.2)
Total Protein: 6.4 g/dL — ABNORMAL LOW (ref 6.5–8.1)
Total Protein: 7.2 g/dL (ref 6.5–8.1)

## 2022-08-12 LAB — CBC WITH DIFFERENTIAL/PLATELET
Abs Immature Granulocytes: 0.01 10*3/uL (ref 0.00–0.07)
Basophils Absolute: 0 10*3/uL (ref 0.0–0.1)
Basophils Relative: 0 %
Eosinophils Absolute: 0 10*3/uL (ref 0.0–0.5)
Eosinophils Relative: 0 %
HCT: 40.8 % (ref 36.0–46.0)
Hemoglobin: 13.9 g/dL (ref 12.0–15.0)
Immature Granulocytes: 0 %
Lymphocytes Relative: 22 %
Lymphs Abs: 1.5 10*3/uL (ref 0.7–4.0)
MCH: 32.3 pg (ref 26.0–34.0)
MCHC: 34.1 g/dL (ref 30.0–36.0)
MCV: 94.7 fL (ref 80.0–100.0)
Monocytes Absolute: 0.2 10*3/uL (ref 0.1–1.0)
Monocytes Relative: 3 %
Neutro Abs: 5 10*3/uL (ref 1.7–7.7)
Neutrophils Relative %: 75 %
Platelets: 284 10*3/uL (ref 150–400)
RBC: 4.31 MIL/uL (ref 3.87–5.11)
RDW: 11.5 % (ref 11.5–15.5)
WBC: 6.8 10*3/uL (ref 4.0–10.5)
nRBC: 0 % (ref 0.0–0.2)

## 2022-08-12 LAB — MAGNESIUM: Magnesium: 2.1 mg/dL (ref 1.7–2.4)

## 2022-08-12 LAB — ETHANOL: Alcohol, Ethyl (B): <10 mg/dL (ref <10)

## 2022-08-12 MED ORDER — LORAZEPAM 1 MG PO TABS: 1.0000 mg | ORAL_TABLET | ORAL | Status: DC | PRN

## 2022-08-12 MED ORDER — MAGNESIUM HYDROXIDE 400 MG/5ML PO SUSP: 30.0000 mL | Freq: Every day | ORAL | Status: DC | PRN

## 2022-08-12 MED ORDER — OLANZAPINE 10 MG PO TBDP: 10.0000 mg | ORAL_TABLET | Freq: Three times a day (TID) | ORAL | Status: DC | PRN

## 2022-08-12 MED ORDER — ALUM & MAG HYDROXIDE-SIMETH 200-200-20 MG/5ML PO SUSP: 30.0000 mL | ORAL | Status: DC | PRN

## 2022-08-12 MED ORDER — OLANZAPINE 5 MG PO TABS
5.0000 mg | ORAL_TABLET | Freq: Every day | ORAL | Status: DC
  Filled 2022-08-12: qty 1

## 2022-08-12 MED ORDER — ACETAMINOPHEN 325 MG PO TABS: 650.0000 mg | ORAL_TABLET | ORAL | Status: DC | PRN

## 2022-08-12 MED ORDER — ZIPRASIDONE MESYLATE 20 MG IM SOLR: 20.0000 mg | INTRAMUSCULAR | Status: DC | PRN

## 2022-08-12 MED ORDER — OLANZAPINE 10 MG IM SOLR: 5.0000 mg | Freq: Once | INTRAMUSCULAR | Status: DC

## 2022-08-12 MED ORDER — LORAZEPAM 2 MG/ML IJ SOLN: 1.0000 mg | Freq: Once | INTRAMUSCULAR | Status: DC

## 2022-08-12 MED ORDER — ZIPRASIDONE MESYLATE 20 MG IM SOLR: 20.0000 mg | Freq: Two times a day (BID) | INTRAMUSCULAR | Status: DC | PRN

## 2022-08-12 MED ORDER — ACETAMINOPHEN 325 MG PO TABS: 650.0000 mg | ORAL_TABLET | Freq: Four times a day (QID) | ORAL | Status: DC | PRN

## 2022-08-12 MED ORDER — RISPERIDONE 1 MG PO TBDP: 2.0000 mg | ORAL_TABLET | Freq: Three times a day (TID) | ORAL | Status: DC | PRN

## 2022-08-12 NOTE — ED Notes (Signed)
Patient was transferred from the [facility] to [facility] for medical clearance. Patient refused vitals and blood work.

## 2022-08-12 NOTE — ED Provider Notes (Signed)
[facility] EMERGENCY DE[facility]MENT AT [facility] Provider Note   CSN: [id number] Arrival date & time: 08/12/22  1654     History  Chief Complaint  Patient presents with   Medical Clearance    Breena Hagadorn is a 27 y.o. female.  HPI    27 year old female comes in with chief complaint of psychosis.  Patient actually sent to the emergency room from behavioral health urgent care as she was not cooperative -sent to ER for medical clearance.  Patient was brought into [facility] by police department in first place after IVC was completed by patient's mother.  According to the IVC paperwork, patient has been talking nonsensically and has made claims like he dropping from her buttocks and seeing her friend in the TV who has passed away.  Currently patient is calm.  She does have tangential thinking and requires redirection.  She denies any pain anywhere.  She denies any suicidal ideation or homicidal ideation.  She states that the room she was in was blue and it made "her body comfortable but her mood uncomfortable."   Home Medications Prior to Admission medications   Not on File      Allergies    Penicillins    Review of Systems   Review of Systems  Physical Exam Updated Vital Signs BP 130/84 (BP Location: Right Arm)   Pulse 90   Temp 98.5 F (36.9 C) (Oral)   Resp 18   SpO2 100%  Physical Exam Vitals and nursing note reviewed.  Constitutional:      Appearance: She is well-developed.  HENT:     Head: Atraumatic.  Cardiovascular:     Rate and Rhythm: Normal rate.  Pulmonary:     Effort: Pulmonary effort is normal.  Musculoskeletal:     Cervical back: Normal range of motion and neck supple.  Skin:    General: Skin is warm and dry.  Neurological:     Mental Status: She is alert and oriented to person, place, and time.  Psychiatric:        Attention and Perception: Attention normal.        Mood and Affect: Mood is anxious.        Speech: Speech is  tangential.        Behavior: Behavior is hyperactive. Behavior is not agitated or aggressive. Behavior is cooperative.        Thought Content: Thought content is paranoid and delusional.     ED Results / Procedures / Treatments   Labs (all labs ordered are listed, but only abnormal results are displayed) Labs Reviewed  CBC WITH DIFFERENTIAL/PLATELET  RAPID URINE DRUG SCREEN, HOSP PERFORMED  COMPREHENSIVE METABOLIC PANEL  ETHANOL  HCG, SERUM, QUALITATIVE    EKG None  Radiology No results found.  Procedures Procedures    Medications Ordered in ED Medications  risperiDONE (RISPERDAL M-TABS) disintegrating tablet 2 mg (has no administration in time range)    And  LORazepam (ATIVAN) tablet 1 mg (has no administration in time range)    And  ziprasidone (GEODON) injection 20 mg (has no administration in time range)  acetaminophen (TYLENOL) tablet 650 mg (has no administration in time range)    ED Course/ Medical Decision Making/ A&P                                 Medical Decision Making Amount and/or Complexity of Data Reviewed Labs: ordered.  Risk OTC drugs. Prescription drug management.   27 year old female comes in with chief complaint of psychosis and agitation.  Differential diagnosis effectively includes psychosis NOS, mood disorder NOS, SUD.  Currently patient is cooperative.  She is in a hallway bed.  PD states that patient was not cooperating at [facility], and patient was sent to the ER for medical clearance.  I reviewed patient's notes from [facility].  It appears that patient did get some labs done at [facility].  We will complete her blood work here. If patient remains cooperative then we can safely discharge her back.  Reassessment: At 7 PM BHU C, Berneice Heinrich has accepted the patient back.  Patient remains cooperative.  She indicated to me as I was passing by, that she thinks she will benefit by going back to [facility]. Nursing staff indicates that patient  cooperative with the blood draw.  She is stable for transfer at this time.  Final Clinical Impression(s) / ED Diagnoses Final diagnoses:  Schizoaffective disorder, unspecified type [facility])    Rx / DC Orders ED Discharge Orders     None         Derwood Kaplan, MD 08/12/22 1901

## 2022-08-12 NOTE — ED Triage Notes (Signed)
Pt is from [facility]. [facility] states she refused VS. Axox4. Paranoid.

## 2022-08-12 NOTE — ED Provider Notes (Signed)
[facility]gent [facility] Continuous Assessment Admission H&P  Date: 08/12/22 Patient Name: Christy [name] MRN: [id number] Chief Complaint: IVC  Diagnoses:  Final diagnoses:  Psychosis, unspecified psychosis type [facility])    HPI: Patient presents to [facility] behavioral health via Patent examiner.  Involuntary commitment petition initiated by patient's mother, Christy [name].  Petition reads: "Respondent has been diagnosed with acute psychosis and bipolar disorder.  Respondent does not take prescribed medication.  Respondent has been committed back in April to [facility] behavioral health.  Respondent believes that her house keys fell out of her buttocks.  Respondent believes that her eyes are wet but she is not crying and that she is coughing up blood.  A friend passed away 2 years ago and the respondent believes that she sees her on the TV.  Respondent makes growling noises and speaks very loud and aggressive.  Respondent does smoke marijuana daily or up to 3-4 times a week."  Patient is assessed by this nurse practitioner face-to-face.  She is seated in observation area.  She is alert and oriented, she is not cooperative during assessment.  Patient initially refusing collection of vital signs, collection of labs and urine.  Patient presents with anxious mood, labile affect.  Chart reviewed and patient discussed with Dr. Nelly Rout on 08/12/2022.  Christy [name] reports paranoid ideations/delusions.  She states "I have went to court twice, 1 time somebody threw up.  She looked the same as me, she could have played with my emotions."  Patient asks this Clinical research associate "I feel like I saw you when I was at court, do you have friends that are lawyers?"  Patient denies suicidal and homicidal ideations.  She endorses auditory hallucinations "sometimes, I have always been like that, I talk to myself out loud."  Patient appears to be responding to internal stimuli currently.  She interrupts speaking this Clinical research associate to  state toward unoccupied side of room "I will get right back to you."  Patient answers interview assessment questions then asks unoccupied corner of room "Am I lying?"  Christy [name] indicates she discontinued medications after inpatient psychiatric admission at [facility] immediately after discharge in April 2024.  Patient states "I do not feel like I needed it (medication)."  She endorses paranoid delusions surrounding her skin.  She states "I had burn marks on my feet when I came here but they turned white and went away while I was sitting here."  Christy [name] has been diagnosed with psychotic disorder unspecified.  1 previous inpatient psychiatric admission at [facility] behavioral health approximately 4 months ago.  She was prescribed olanzapine 10 mg nightly and trazodone at that time.  She is not linked with outpatient psychiatry currently has not followed up with outpatient psychiatry.  She denies family mental health or addiction history.  Patient resides in [place] with her child.  Regarding whereabouts of child patient states "I cannot answer, not at all."  Patient is currently not employed seeking position to work from home.  She states "I do not like daycare, I am afraid my child will get sick."  Conversation tangential, patient states "are you the dermatologist?"  Thought content disorganized, patient states "it makes perfect sense, but it makes me mad. You touched the door, now you must close it." Patient reports concern about an event that happened 1 week ago.  She states "I am not sure but a key fell out of my butt, do you think a key can fall out of someone's ass?"  Patient endorses marijuana  use several times per week.  She denies alcohol use, denies substance use aside from marijuana.  She endorses average sleep and appetite.  Christy [name] offered support and encouragement. Reviewed plan to include medical clearance at [facility] ED.     Total Time spent with patient: 1 hour  Musculoskeletal   Strength & Muscle Tone: within normal limits Gait & Station: normal Patient leans: N/A  Psychiatric Specialty Exam  Presentation General Appearance:  Appropriate for Environment; Casual  Eye Contact: Minimal  Speech: Clear and Coherent; Normal Rate  Speech Volume: Normal  Handedness: Right   Mood and Affect  Mood: Labile  Affect: Labile   Thought Process  Thought Processes: Disorganized  Descriptions of Associations:Tangential  Orientation:Full (Time, Place and Person)  Thought Content:Tangential; Paranoid Ideation  Diagnosis of Schizophrenia or Schizoaffective disorder in past: No  Duration of Psychotic Symptoms: Greater than six months  Hallucinations:Hallucinations: Other (comment) ("Sometimes I talk to myself out loud, I have always been like that")  Ideas of Reference:Paranoia  Suicidal Thoughts:Suicidal Thoughts: No  Homicidal Thoughts:Homicidal Thoughts: No   Sensorium  Memory: Immediate Fair  Judgment: Impaired  Insight: Lacking   Executive Functions  Concentration: Fair  Attention Span: Fair  Recall: Fair  Fund of Knowledge: Good  Language: Good   Psychomotor Activity  Psychomotor Activity: Psychomotor Activity: Normal   Assets  Assets: Communication Skills; Housing; Physical Health; Resilience; Social Support   Sleep  Sleep: Sleep: Fair   Nutritional Assessment (For OBS and FBC admissions only) Has the patient had a weight loss or gain of 10 pounds or more in the last 3 months?: No Has the patient had a decrease in food intake/or appetite?: No Does the patient have dental problems?: No Does the patient have eating habits or behaviors that may be indicators of an eating disorder including binging or inducing vomiting?: No Has the patient recently lost weight without trying?: 0 Has the patient been eating poorly because of a decreased appetite?: 0 Malnutrition Screening Tool Score: 0    Physical  Exam Vitals and nursing note reviewed.  Constitutional:      Appearance: Normal appearance. She is well-developed.  HENT:     Head: Normocephalic and atraumatic.     Nose: Nose normal.  Pulmonary:     Effort: Pulmonary effort is normal.  Musculoskeletal:        General: Normal range of motion.     Cervical back: Normal range of motion.  Skin:    General: Skin is warm and dry.  Neurological:     Mental Status: She is alert and oriented to person, place, and time.  Psychiatric:        Attention and Perception: She perceives auditory hallucinations.        Mood and Affect: Mood is anxious. Affect is labile.        Speech: Speech is tangential.        Behavior: Behavior is uncooperative.        Thought Content: Thought content is paranoid and delusional.        Cognition and Memory: Memory normal.    Review of Systems  Constitutional: Negative.   HENT: Negative.    Eyes: Negative.   Respiratory: Negative.    Cardiovascular: Negative.   Gastrointestinal: Negative.   Genitourinary: Negative.   Musculoskeletal: Negative.   Skin: Negative.   Neurological: Negative.   Psychiatric/Behavioral:  Positive for hallucinations and substance abuse. The patient is nervous/anxious.     There were no vitals taken  for this visit. There is no height or weight on file to calculate BMI.  Past Psychiatric History: Psychosis, depression, unspecified  Is the patient at risk to self? No  Has the patient been a risk to self in the past 6 months? No .    Has the patient been a risk to self within the distant past? No   Is the patient a risk to others? No   Has the patient been a risk to others in the past 6 months? No   Has the patient been a risk to others within the distant past? No   Past Medical History: Bacterial vaginitis, gestational diabetes, hyperemesis, nausea and vomiting, gastroenteritis  Family History: None reported  Social History: Resides in [place], endorses marijuana use  several times per week, currently unemployed  Last Labs:  Admission on 04/20/2022, Discharged on 04/21/2022  Component Date Value Ref Range Status   WBC 04/20/2022 9.3  4.0 - 10.5 K/uL Final   RBC 04/20/2022 4.20  3.87 - 5.11 MIL/uL Final   Hemoglobin 04/20/2022 13.4  12.0 - 15.0 g/dL Final   HCT 82/95/6213 38.3  36.0 - 46.0 % Final   MCV 04/20/2022 91.2  80.0 - 100.0 fL Final   MCH 04/20/2022 31.9  26.0 - 34.0 pg Final   MCHC 04/20/2022 35.0  30.0 - 36.0 g/dL Final   RDW 08/65/7846 11.6  11.5 - 15.5 % Final   Platelets 04/20/2022 254  150 - 400 K/uL Final   nRBC 04/20/2022 0.0  0.0 - 0.2 % Final   Neutrophils Relative % 04/20/2022 73  % Final   Neutro Abs 04/20/2022 6.8  1.7 - 7.7 K/uL Final   Lymphocytes Relative 04/20/2022 21  % Final   Lymphs Abs 04/20/2022 1.9  0.7 - 4.0 K/uL Final   Monocytes Relative 04/20/2022 5  % Final   Monocytes Absolute 04/20/2022 0.5  0.1 - 1.0 K/uL Final   Eosinophils Relative 04/20/2022 0  % Final   Eosinophils Absolute 04/20/2022 0.0  0.0 - 0.5 K/uL Final   Basophils Relative 04/20/2022 1  % Final   Basophils Absolute 04/20/2022 0.1  0.0 - 0.1 K/uL Final   Immature Granulocytes 04/20/2022 0  % Final   Abs Immature Granulocytes 04/20/2022 0.02  0.00 - 0.07 K/uL Final   Performed at [facility], [address] [place], [place] [address]   Sodium 04/20/2022 139  135 - 145 mmol/L Final   Potassium 04/20/2022 3.5  3.5 - 5.1 mmol/L Final   Chloride 04/20/2022 107  98 - 111 mmol/L Final   CO2 04/20/2022 25  22 - 32 mmol/L Final   Glucose, Bld 04/20/2022 83  70 - 99 mg/dL Final   Glucose reference range applies only to samples taken after fasting for at least 8 hours.   BUN 04/20/2022 5 (L)  6 - 20 mg/dL Final   Creatinine, Ser 04/20/2022 0.80  0.44 - 1.00 mg/dL Final   Calcium 28/41/3244 8.9  8.9 - 10.3 mg/dL Final   Total Protein 01/13/7251 7.5  6.5 - 8.1 g/dL Final   Albumin 66/44/0347 4.5  3.5 - 5.0 g/dL Final   AST 42/59/5638  15  15 - 41 U/L Final   ALT 04/20/2022 12  0 - 44 U/L Final   Alkaline Phosphatase 04/20/2022 35 (L)  38 - 126 U/L Final   Total Bilirubin 04/20/2022 0.6  0.3 - 1.2 mg/dL Final   GFR, Estimated 04/20/2022 >60  >60 mL/min Final   Comment: (  NOTE) Calculated using the CKD-EPI Creatinine Equation (2021)    Anion gap 04/20/2022 7  5 - 15 Final   Performed at [facility], [address], [place], [place] [address]   I-stat hCG, quantitative 04/20/2022 <5.0  <5 mIU/mL Final   Comment 3 04/20/2022          Final   Comment:   GEST. AGE      CONC.  (mIU/mL)   <=1 WEEK        5 - 50     2 WEEKS       50 - 500     3 WEEKS       100 - 10,000     4 WEEKS     1,000 - 30,000        FEMALE AND NON-PREGNANT FEMALE:     LESS THAN 5 mIU/mL    Opiates 04/20/2022 NONE DETECTED  NONE DETECTED Final   Cocaine 04/20/2022 NONE DETECTED  NONE DETECTED Final   Benzodiazepines 04/20/2022 NONE DETECTED  NONE DETECTED Final   Amphetamines 04/20/2022 NONE DETECTED  NONE DETECTED Final   Tetrahydrocannabinol 04/20/2022 POSITIVE (A)  NONE DETECTED Final   Barbiturates 04/20/2022 NONE DETECTED  NONE DETECTED Final   Comment: (NOTE) DRUG SCREEN FOR MEDICAL PURPOSES ONLY.  IF CONFIRMATION IS NEEDED FOR ANY PURPOSE, NOTIFY LAB WITHIN 5 DAYS.  LOWEST DETECTABLE LIMITS FOR URINE DRUG SCREEN Drug Class                     Cutoff (ng/mL) Amphetamine and metabolites    1000 Barbiturate and metabolites    200 Benzodiazepine                 200 Opiates and metabolites        300 Cocaine and metabolites        300 THC                            50 Performed at [facility], [address] [place], [place] [address]    Alcohol, Ethyl (B) 04/20/2022 <10  <10 mg/dL Final   Comment: (NOTE) Lowest detectable limit for serum alcohol is 10 mg/dL.  For medical purposes only. Performed at [facility], [address], [place], [place] [address]    Salicylate Lvl  04/20/2022 <7.0 (L)  7.0 - 30.0 mg/dL Final   Performed at [facility], [address], [place], [place] [address]   Acetaminophen (Tylenol), Serum 04/20/2022 <10 (L)  10 - 30 ug/mL Final   Comment: (NOTE) Therapeutic concentrations vary significantly. A range of 10-30 ug/mL  may be an effective concentration for many patients. However, some  are best treated at concentrations outside of this range. Acetaminophen concentrations >150 ug/mL at 4 hours after ingestion  and >50 ug/mL at 12 hours after ingestion are often associated with  toxic reactions.  Performed at [facility], [address] [place], [place] [address]    SARS Coronavirus 2 by RT PCR 04/20/2022 NEGATIVE  NEGATIVE Final   Comment: (NOTE) SARS-CoV-2 target nucleic acids are NOT DETECTED.  The SARS-CoV-2 RNA is generally detectable in upper respiratory specimens during the acute phase of infection. The lowest concentration of SARS-CoV-2 viral copies this assay can detect is 138 copies/mL. A negative result does not preclude SARS-Cov-2 infection and should not be used as the sole basis for treatment or  other patient management decisions. A negative result may occur with  improper specimen collection/handling, submission of specimen other than nasopharyngeal swab, presence of viral mutation(s) within the areas targeted by this assay, and inadequate number of viral copies(<138 copies/mL). A negative result must be combined with clinical observations, patient history, and epidemiological information. The expected result is Negative.  Fact Sheet for Patients:  BloggerCourse.com  Fact Sheet for Healthcare Providers:  SeriousBroker.it  This test is no                          t yet approved or cleared by the [place] FDA and  has been authorized for detection and/or diagnosis of SARS-CoV-2 by FDA under an Emergency Use  Authorization (EUA). This EUA will remain  in effect (meaning this test can be used) for the duration of the COVID-19 declaration under Section 564(b)(1) of the Act, 21 U.S.C.section 360bbb-3(b)(1), unless the authorization is terminated  or revoked sooner.       Influenza A by PCR 04/20/2022 NEGATIVE  NEGATIVE Final   Influenza B by PCR 04/20/2022 NEGATIVE  NEGATIVE Final   Comment: (NOTE) The Xpert Xpress SARS-CoV-2/FLU/RSV plus assay is intended as an aid in the diagnosis of influenza from Nasopharyngeal swab specimens and should not be used as a sole basis for treatment. Nasal washings and aspirates are unacceptable for Xpert Xpress SARS-CoV-2/FLU/RSV testing.  Fact Sheet for Patients: BloggerCourse.com  Fact Sheet for Healthcare Providers: SeriousBroker.it  This test is not yet approved or cleared by the [place] FDA and has been authorized for detection and/or diagnosis of SARS-CoV-2 by FDA under an Emergency Use Authorization (EUA). This EUA will remain in effect (meaning this test can be used) for the duration of the COVID-19 declaration under Section 564(b)(1) of the Act, 21 U.S.C. section 360bbb-3(b)(1), unless the authorization is terminated or revoked.     Resp Syncytial Virus by PCR 04/20/2022 NEGATIVE  NEGATIVE Final   Comment: (NOTE) Fact Sheet for Patients: BloggerCourse.com  Fact Sheet for Healthcare Providers: SeriousBroker.it  This test is not yet approved or cleared by the [place] FDA and has been authorized for detection and/or diagnosis of SARS-CoV-2 by FDA under an Emergency Use Authorization (EUA). This EUA will remain in effect (meaning this test can be used) for the duration of the COVID-19 declaration under Section 564(b)(1) of the Act, 21 U.S.C. section 360bbb-3(b)(1), unless the authorization is terminated or revoked.  Performed at  [facility], [address], [place], [place] [address]    TSH 04/20/2022 1.707  0.350 - 4.500 uIU/mL Final   Comment: Performed by a 3rd Generation assay with a functional sensitivity of <=0.01 uIU/mL. Performed at [facility], [address], [place], [place] [address]    Cholesterol 04/20/2022 180  0 - 200 mg/dL Final   Triglycerides 63/87/5643 88  <150 mg/dL Final   HDL 32/95/1884 42  >40 mg/dL Final   Total CHOL/HDL Ratio 04/20/2022 4.3  RATIO Final   VLDL 04/20/2022 18  0 - 40 mg/dL Final   LDL Cholesterol 04/20/2022 120 (H)  0 - 99 mg/dL Final   Comment:        Total Cholesterol/HDL:CHD Risk Coronary Heart Disease Risk Table                     Men   Women  1/2 Average Risk   3.4   3.3  Average Risk  5.0   4.4  2 X Average Risk   9.6   7.1  3 X Average Risk  23.4   11.0        Use the calculated Patient Ratio above and the CHD Risk Table to determine the patient's CHD Risk.        ATP III CLASSIFICATION (LDL):  <100     mg/dL   Optimal  696-295  mg/dL   Near or Above                    Optimal  130-159  mg/dL   Borderline  284-132  mg/dL   High  >440     mg/dL   Very High Performed at [facility], [address] [place], [place] [address]    Hgb A1c MFr Bld 04/20/2022 4.7 (L)  4.8 - 5.6 % Final   Comment: (NOTE) Pre diabetes:          5.7%-6.4%  Diabetes:              >6.4%  Glycemic control for   <7.0% adults with diabetes    Mean Plasma Glucose 04/20/2022 88.19  mg/dL Final   Performed at [facility], [address] [place], [place] [address]    Allergies: Penicillins  Medications:  Facility Ordered Medications  Medication   acetaminophen (TYLENOL) tablet 650 mg   alum & mag hydroxide-simeth (MAALOX/MYLANTA) 200-200-20 MG/5ML suspension 30 mL   magnesium hydroxide (MILK OF MAGNESIA) suspension 30 mL   PTA Medications  Medication Sig   traZODone (DESYREL) 50 MG tablet Take 1  tablet (50 mg total) by mouth at bedtime as needed for sleep.   OLANZapine (ZYPREXA) 10 MG tablet Take 1 tablet (10 mg total) by mouth at bedtime.      Medical Decision Making      Recommendations  Based on my evaluation the patient appears to have an emergency medical condition for which I recommend the patient be transferred to the emergency department for further evaluation.  Laboratory studies ordered including CBC, CMP, ethanol,  magnesium, prolactin and TSH.  Urine pregnancy, urine drug screen ordered.  EKG order initiated.  Current medications: -Acetaminophen 650 mg every 6 as needed/mild pain -Maalox 30 mL oral every 4 as needed/digestion -Magnesium hydroxide 30 mL daily as needed/mild constipation  Agitation protocol initiated including: -Olanzapine Zydis 10 mg every 8 hours as needed/agitation -Lorazepam 1 mg as needed as needed/anxiety or severe agitation for 1 dose -Ziprasidone 20 mg IM as needed/agitation for 1 dose  Patient IVC, first exam 12 hold petition completed by this Clinical research associate.  Patient accepted to [facility] emergency department for medical clearance by Dr. Wilkie Aye.  Patient noncompliant with staff, refusing vital signs, labs and urine collection.  Lenard Lance, FNP 08/12/22  3:00 PM

## 2022-08-12 NOTE — ED Notes (Signed)
After several attempts of speaking with patient as well as Tammy Sours LPN speaking with patient we were able to coach patient to coming onto the unit. In the beginning patient was very non compliant.

## 2022-08-12 NOTE — ED Notes (Signed)
Patient refused EKG as well as urine preg screen.

## 2022-08-12 NOTE — ED Provider Notes (Incomplete)
[facility] Continuous Assessment Admission H&P  Date: 08/12/22 Patient Name: Christy [name] MRN: [id number] Chief Complaint: ***  Diagnoses:  Final diagnoses:  Psychosis, unspecified psychosis type [facility])  Marijuana use    HPI: Christy [name] is a 27 year old female with psychiatric history of  Brief psychotic disorder, schizophrenia, bipolar type, and psychosis who initially presented to [facility] behavioral health via law enforcement under Involuntary commitment petition initiated by patient's mother, [name] [phone]) due to delusional thinking and hallucinations of a friend who died 2 years ago and medication non-compliance.    Patient was transferred to [facility] for medical clearance because she was noncompliant with staff, refusing vital signs, labs, and urine collection. Patient was evaluated at the ED, stabilized, and transferred back to the [facility] for psychiatric care.   Patient was seen face-to-face by this provider and chart reviewed.  Per chart review, patient was last admitted inpatient in between 04/21/2022 to 04/28/2022 for acute psychosis.   Petition reads: "Respondent has been diagnosed with acute psychosis and bipolar disorder.  Respondent does not take prescribed medication.  Respondent has been committed back in April to [facility] behavioral health.  Respondent believes that her house keys fell out of her buttocks.  Respondent believes that her eyes are wet but she is not crying and that she is coughing up blood.  A friend passed away 2 years ago and the respondent believes that she sees her on the TV.  Respondent makes growling noises and speaks very loud and aggressive.  Respondent does smoke marijuana daily or up to 3-4 times a week.  On evaluation, patient is alert, no focal deficit, and uncooperative. Speech is blocked, with decreased volume. Pt appears casual. Eye contact is fair. Mood is labile, affect is congruent with mood. Thought process is  disorganized, tangential, and thought content is scattered. Pt denied SI/HI/AVH. There is objective indication that the patient is responding to internal stimuli as she giggles, mumbles, and stares off with a blank look at times. No delusions elicited during this assessment.   Patient reports " I have to go to court, I don't want to talk about it unless he's here, I feel out of it, my body is wet, my skin is breaking out, that's my fault, but anything else extra is extra, so I had to like a ...... I had to umm........ sit in the hospital and be evaluated, and make sure I'm not insane in March".  Patient is unable to engage in evaluation and appears to be responding to internal stimuli with long periods of silence in between questions.  Other bizarre behaviors include giggling, mumbling, staring at provider intently and sometimes with a blank look.   Patient is recommended for inpatient psychiatric admission for stabilization and treatment.  Report, encouragement, reassurance provided about ongoing stressors.  Patient is provided with opportunity for questions.  Total Time spent with patient: 20 minutes  Musculoskeletal  Strength & Muscle Tone: within normal limits Gait & Station: normal Patient leans: N/A  Psychiatric Specialty Exam  Presentation General Appearance:  Casual  Eye Contact: Fair  Speech: Blocked  Speech Volume: Decreased  Handedness: Right   Mood and Affect  Mood: Labile  Affect: Labile   Thought Process  Thought Processes: Disorganized  Descriptions of Associations:Tangential  Orientation:Partial  Thought Content:Tangential; Scattered  Diagnosis of Schizophrenia or Schizoaffective disorder in past: No  Duration of Psychotic Symptoms: Less than six months  Hallucinations:Hallucinations: Other (comment) (Pt reports talking to herself  sometimes)  Ideas of Reference:Paranoia  Suicidal Thoughts:Suicidal Thoughts: No  Homicidal Thoughts:Homicidal  Thoughts: No   Sensorium  Memory: Immediate Poor  Judgment: Impaired  Insight: Lacking   Executive Functions  Concentration: Poor  Attention Span: Poor  Recall: Poor  Fund of Knowledge: Poor  Language: Poor   Psychomotor Activity  Psychomotor Activity: Psychomotor Activity: Normal   Assets  Assets: Communication Skills; Desire for Improvement   Sleep  Sleep: Sleep: Poor   Nutritional Assessment (For OBS and FBC admissions only) Has the patient had a weight loss or gain of 10 pounds or more in the last 3 months?: No Has the patient had a decrease in food intake/or appetite?: No Does the patient have dental problems?: No Does the patient have eating habits or behaviors that may be indicators of an eating disorder including binging or inducing vomiting?: No Has the patient recently lost weight without trying?: 0 Has the patient been eating poorly because of a decreased appetite?: 0 Malnutrition Screening Tool Score: 0    Physical Exam Constitutional:      General: She is not in acute distress.    Appearance: She is not diaphoretic.  HENT:     Head: Normocephalic.     Right Ear: External ear normal.     Left Ear: External ear normal.     Nose: No congestion.  Eyes:     General:        Right eye: No discharge.        Left eye: No discharge.  Pulmonary:     Effort: No respiratory distress.  Chest:     Chest wall: No tenderness.  Neurological:     General: No focal deficit present.     Mental Status: She is alert.  Psychiatric:        Attention and Perception: She perceives visual hallucinations.        Mood and Affect: Affect is labile and inappropriate.        Speech: Speech is tangential.        Behavior: Behavior is uncooperative.        Thought Content: Thought content is not paranoid or delusional. Thought content does not include homicidal or suicidal ideation. Thought content does not include homicidal or suicidal plan.         Cognition and Memory: Cognition is impaired.        Judgment: Judgment is inappropriate.    Review of Systems  Constitutional:  Negative for chills and diaphoresis.  HENT:  Negative for congestion.   Eyes:  Negative for discharge.  Respiratory:  Negative for cough, shortness of breath and wheezing.   Cardiovascular:  Negative for chest pain and palpitations.  Gastrointestinal:  Negative for diarrhea, nausea and vomiting.  Neurological:  Negative for dizziness, seizures, loss of consciousness, weakness and headaches.     Past Psychiatric History: See H & P   Is the patient at risk to self? Yes  Has the patient been a risk to self in the past 6 months? Yes .    Has the patient been a risk to self within the distant past? Yes   Is the patient a risk to others? No   Has the patient been a risk to others in the past 6 months? Yes   Has the patient been a risk to others within the distant past?  unknown  Past Medical History: See Chart  Family History: N/A  Social History: N/A  Last Labs:  Admission on 08/12/2022,  Discharged on 08/12/2022  Component Date Value Ref Range Status  . WBC 08/12/2022 6.8  4.0 - 10.5 K/uL Final  . RBC 08/12/2022 4.31  3.87 - 5.11 MIL/uL Final  . Hemoglobin 08/12/2022 13.9  12.0 - 15.0 g/dL Final  . HCT 16/10/9602 40.8  36.0 - 46.0 % Final  . MCV 08/12/2022 94.7  80.0 - 100.0 fL Final  . MCH 08/12/2022 32.3  26.0 - 34.0 pg Final  . MCHC 08/12/2022 34.1  30.0 - 36.0 g/dL Final  . RDW 54/09/8117 11.5  11.5 - 15.5 % Final  . Platelets 08/12/2022 284  150 - 400 K/uL Final  . nRBC 08/12/2022 0.0  0.0 - 0.2 % Final  . Neutrophils Relative % 08/12/2022 75  % Final  . Neutro Abs 08/12/2022 5.0  1.7 - 7.7 K/uL Final  . Lymphocytes Relative 08/12/2022 22  % Final  . Lymphs Abs 08/12/2022 1.5  0.7 - 4.0 K/uL Final  . Monocytes Relative 08/12/2022 3  % Final  . Monocytes Absolute 08/12/2022 0.2  0.1 - 1.0 K/uL Final  . Eosinophils Relative 08/12/2022 0  %  Final  . Eosinophils Absolute 08/12/2022 0.0  0.0 - 0.5 K/uL Final  . Basophils Relative 08/12/2022 0  % Final  . Basophils Absolute 08/12/2022 0.0  0.0 - 0.1 K/uL Final  . Immature Granulocytes 08/12/2022 0  % Final  . Abs Immature Granulocytes 08/12/2022 0.01  0.00 - 0.07 K/uL Final   Performed at [facility], [address], [place], [place] [address]  . Sodium 08/12/2022 135  135 - 145 mmol/L Final  . Potassium 08/12/2022 3.8  3.5 - 5.1 mmol/L Final  . Chloride 08/12/2022 105  98 - 111 mmol/L Final  . CO2 08/12/2022 21 (L)  22 - 32 mmol/L Final  . Glucose, Bld 08/12/2022 80  70 - 99 mg/dL Final   Glucose reference range applies only to samples taken after fasting for at least 8 hours.  . BUN 08/12/2022 12  6 - 20 mg/dL Final  . Creatinine, Ser 08/12/2022 0.76  0.44 - 1.00 mg/dL Final  . Calcium 95/62/1308 9.0  8.9 - 10.3 mg/dL Final  . Total Protein 08/12/2022 7.2  6.5 - 8.1 g/dL Final  . Albumin 65/78/4696 4.3  3.5 - 5.0 g/dL Final  . AST 29/52/8413 15  15 - 41 U/L Final  . ALT 08/12/2022 13  0 - 44 U/L Final  . Alkaline Phosphatase 08/12/2022 39  38 - 126 U/L Final  . Total Bilirubin 08/12/2022 0.9  0.3 - 1.2 mg/dL Final  . GFR, Estimated 08/12/2022 >60  >60 mL/min Final   Comment: (NOTE) Calculated using the CKD-EPI Creatinine Equation (2021)   . Anion gap 08/12/2022 9  5 - 15 Final   Performed at [facility], [address], [place], [place] [address]  . Alcohol, Ethyl (B) 08/12/2022 <10  <10 mg/dL Final   Comment: (NOTE) Lowest detectable limit for serum alcohol is 10 mg/dL.  For medical purposes only. Performed at [facility], [address] [place], [place] [address]   Admission on 08/12/2022, Discharged on 08/12/2022  Component Date Value Ref Range Status  . Sodium 08/12/2022 140  135 - 145 mmol/L Final  . Potassium 08/12/2022 3.4 (L)  3.5 - 5.1 mmol/L Final  . Chloride 08/12/2022 102  98 - 111 mmol/L Final  . CO2 08/12/2022 24  22 - 32  mmol/L Final  . Glucose, Bld 08/12/2022 160 (H)  70 - 99 mg/dL Final   Glucose reference range applies only to samples taken after fasting for at least 8 hours.  . BUN 08/12/2022 19  6 - 20 mg/dL Final  . Creatinine, Ser 08/12/2022 0.96  0.44 - 1.00 mg/dL Final  . Calcium 24/40/1027 8.5 (L)  8.9 - 10.3 mg/dL Final  . Total Protein 08/12/2022 6.4 (L)  6.5 - 8.1 g/dL Final  . Albumin 25/36/6440 3.6  3.5 - 5.0 g/dL Final  . AST 34/74/2595 78 (H)  15 - 41 U/L Final  . ALT 08/12/2022 57 (H)  0 - 44 U/L Final  . Alkaline Phosphatase 08/12/2022 68  38 - 126 U/L Final  . Total Bilirubin 08/12/2022 0.5  0.3 - 1.2 mg/dL Final  . GFR, Estimated 08/12/2022 >60  >60 mL/min Final   Comment: (NOTE) Calculated using the CKD-EPI Creatinine Equation (2021)   . Anion gap 08/12/2022 14  5 - 15 Final   Performed at [facility], [address] [place], [place] [address]  . Magnesium 08/12/2022 2.1  1.7 - 2.4 mg/dL Final   Performed at [facility], [address] [place], [place] [address]  Admission on 04/20/2022, Discharged on 04/21/2022  Component Date Value Ref Range Status  . WBC 04/20/2022 9.3  4.0 - 10.5 K/uL Final  . RBC 04/20/2022 4.20  3.87 - 5.11 MIL/uL Final  . Hemoglobin 04/20/2022 13.4  12.0 - 15.0 g/dL Final  . HCT 95/18/8416 38.3  36.0 - 46.0 % Final  . MCV 04/20/2022 91.2  80.0 - 100.0 fL Final  . MCH 04/20/2022 31.9  26.0 - 34.0 pg Final  . MCHC 04/20/2022 35.0  30.0 - 36.0 g/dL Final  . RDW 60/63/0160 11.6  11.5 - 15.5 % Final  . Platelets 04/20/2022 254  150 - 400 K/uL Final  . nRBC 04/20/2022 0.0  0.0 - 0.2 % Final  . Neutrophils Relative % 04/20/2022 73  % Final  . Neutro Abs 04/20/2022 6.8  1.7 - 7.7 K/uL Final  . Lymphocytes Relative 04/20/2022 21  % Final  . Lymphs Abs 04/20/2022 1.9  0.7 - 4.0 K/uL Final  . Monocytes Relative 04/20/2022 5  % Final  . Monocytes Absolute 04/20/2022 0.5  0.1 - 1.0 K/uL Final  . Eosinophils Relative 04/20/2022 0  % Final  .  Eosinophils Absolute 04/20/2022 0.0  0.0 - 0.5 K/uL Final  . Basophils Relative 04/20/2022 1  % Final  . Basophils Absolute 04/20/2022 0.1  0.0 - 0.1 K/uL Final  . Immature Granulocytes 04/20/2022 0  % Final  . Abs Immature Granulocytes 04/20/2022 0.02  0.00 - 0.07 K/uL Final   Performed at [facility], [address], [place], [place] [address]  . Sodium 04/20/2022 139  135 - 145 mmol/L Final  . Potassium 04/20/2022 3.5  3.5 - 5.1 mmol/L Final  . Chloride 04/20/2022 107  98 - 111 mmol/L Final  . CO2 04/20/2022 25  22 - 32 mmol/L Final  . Glucose, Bld 04/20/2022 83  70 - 99 mg/dL Final   Glucose reference range applies only to samples taken after fasting for at least 8 hours.  . BUN 04/20/2022 5 (L)  6 - 20 mg/dL Final  . Creatinine, Ser 04/20/2022 0.80  0.44 - 1.00 mg/dL Final  . Calcium 35/57/3220 8.9  8.9 - 10.3 mg/dL Final  . Total Protein 04/20/2022 7.5  6.5 - 8.1 g/dL Final  . Albumin 25/42/7062 4.5  3.5 - 5.0  g/dL Final  . AST 16/10/9602 15  15 - 41 U/L Final  . ALT 04/20/2022 12  0 - 44 U/L Final  . Alkaline Phosphatase 04/20/2022 35 (L)  38 - 126 U/L Final  . Total Bilirubin 04/20/2022 0.6  0.3 - 1.2 mg/dL Final  . GFR, Estimated 04/20/2022 >60  >60 mL/min Final   Comment: (NOTE) Calculated using the CKD-EPI Creatinine Equation (2021)   . Anion gap 04/20/2022 7  5 - 15 Final   Performed at [facility], [address] [place], [place] [address]  . I-stat hCG, quantitative 04/20/2022 <5.0  <5 mIU/mL Final  . Comment 3 04/20/2022          Final   Comment:   GEST. AGE      CONC.  (mIU/mL)   <=1 WEEK        5 - 50     2 WEEKS       50 - 500     3 WEEKS       100 - 10,000     4 WEEKS     1,000 - 30,000        FEMALE AND NON-PREGNANT FEMALE:     LESS THAN 5 mIU/mL   . Opiates 04/20/2022 NONE DETECTED  NONE DETECTED Final  . Cocaine 04/20/2022 NONE DETECTED  NONE DETECTED Final  . Benzodiazepines 04/20/2022 NONE DETECTED  NONE DETECTED  Final  . Amphetamines 04/20/2022 NONE DETECTED  NONE DETECTED Final  . Tetrahydrocannabinol 04/20/2022 POSITIVE (A)  NONE DETECTED Final  . Barbiturates 04/20/2022 NONE DETECTED  NONE DETECTED Final   Comment: (NOTE) DRUG SCREEN FOR MEDICAL PURPOSES ONLY.  IF CONFIRMATION IS NEEDED FOR ANY PURPOSE, NOTIFY LAB WITHIN 5 DAYS.  LOWEST DETECTABLE LIMITS FOR URINE DRUG SCREEN Drug Class                     Cutoff (ng/mL) Amphetamine and metabolites    1000 Barbiturate and metabolites    200 Benzodiazepine                 200 Opiates and metabolites        300 Cocaine and metabolites        300 THC                            50 Performed at [facility], [address], [place], [place] [address]   . Alcohol, Ethyl (B) 04/20/2022 <10  <10 mg/dL Final   Comment: (NOTE) Lowest detectable limit for serum alcohol is 10 mg/dL.  For medical purposes only. Performed at [facility], [address] [place], [place] [address]   . Salicylate Lvl 04/20/2022 <7.0 (L)  7.0 - 30.0 mg/dL Final   Performed at [facility], [address] [place], [place] [address]  . Acetaminophen (Tylenol), Serum 04/20/2022 <10 (L)  10 - 30 ug/mL Final   Comment: (NOTE) Therapeutic concentrations vary significantly. A range of 10-30 ug/mL  may be an effective concentration for many patients. However, some  are best treated at concentrations outside of this range. Acetaminophen concentrations >150 ug/mL at 4 hours after ingestion  and >50 ug/mL at 12 hours after ingestion are often associated with  toxic reactions.  Performed at [facility], [address] [place], [place] [address]   . SARS Coronavirus 2 by RT PCR 04/20/2022 NEGATIVE  NEGATIVE Final  Comment: (NOTE) SARS-CoV-2 target nucleic acids are NOT DETECTED.  The SARS-CoV-2 RNA is generally detectable in upper respiratory specimens during the acute phase of infection.  The lowest concentration of SARS-CoV-2 viral copies this assay can detect is 138 copies/mL. A negative result does not preclude SARS-Cov-2 infection and should not be used as the sole basis for treatment or other patient management decisions. A negative result may occur with  improper specimen collection/handling, submission of specimen other than nasopharyngeal swab, presence of viral mutation(s) within the areas targeted by this assay, and inadequate number of viral copies(<138 copies/mL). A negative result must be combined with clinical observations, patient history, and epidemiological information. The expected result is Negative.  Fact Sheet for Patients:  BloggerCourse.com  Fact Sheet for Healthcare Providers:  SeriousBroker.it  This test is no                          t yet approved or cleared by the [place] FDA and  has been authorized for detection and/or diagnosis of SARS-CoV-2 by FDA under an Emergency Use Authorization (EUA). This EUA will remain  in effect (meaning this test can be used) for the duration of the COVID-19 declaration under Section 564(b)(1) of the Act, 21 U.S.C.section 360bbb-3(b)(1), unless the authorization is terminated  or revoked sooner.      . Influenza A by PCR 04/20/2022 NEGATIVE  NEGATIVE Final  . Influenza B by PCR 04/20/2022 NEGATIVE  NEGATIVE Final   Comment: (NOTE) The Xpert Xpress SARS-CoV-2/FLU/RSV plus assay is intended as an aid in the diagnosis of influenza from Nasopharyngeal swab specimens and should not be used as a sole basis for treatment. Nasal washings and aspirates are unacceptable for Xpert Xpress SARS-CoV-2/FLU/RSV testing.  Fact Sheet for Patients: BloggerCourse.com  Fact Sheet for Healthcare Providers: SeriousBroker.it  This test is not yet approved or cleared by the [place] FDA and has been authorized for  detection and/or diagnosis of SARS-CoV-2 by FDA under an Emergency Use Authorization (EUA). This EUA will remain in effect (meaning this test can be used) for the duration of the COVID-19 declaration under Section 564(b)(1) of the Act, 21 U.S.C. section 360bbb-3(b)(1), unless the authorization is terminated or revoked.    Marland Kitchen Resp Syncytial Virus by PCR 04/20/2022 NEGATIVE  NEGATIVE Final   Comment: (NOTE) Fact Sheet for Patients: BloggerCourse.com  Fact Sheet for Healthcare Providers: SeriousBroker.it  This test is not yet approved or cleared by the [place] FDA and has been authorized for detection and/or diagnosis of SARS-CoV-2 by FDA under an Emergency Use Authorization (EUA). This EUA will remain in effect (meaning this test can be used) for the duration of the COVID-19 declaration under Section 564(b)(1) of the Act, 21 U.S.C. section 360bbb-3(b)(1), unless the authorization is terminated or revoked.  Performed at [facility], [address], [place], [place] [address]   . TSH 04/20/2022 1.707  0.350 - 4.500 uIU/mL Final   Comment: Performed by a 3rd Generation assay with a functional sensitivity of <=0.01 uIU/mL. Performed at [facility], [address], [place], [place] [address]   . Cholesterol 04/20/2022 180  0 - 200 mg/dL Final  . Triglycerides 04/20/2022 88  <150 mg/dL Final  . HDL 82/95/6213 42  >40 mg/dL Final  . Total CHOL/HDL Ratio 04/20/2022 4.3  RATIO Final  . VLDL 04/20/2022 18  0 - 40 mg/dL Final  . LDL Cholesterol 04/20/2022 120 (H)  0 - 99  mg/dL Final   Comment:        Total Cholesterol/HDL:CHD Risk Coronary Heart Disease Risk Table                     Men   Women  1/2 Average Risk   3.4   3.3  Average Risk       5.0   4.4  2 X Average Risk   9.6   7.1  3 X Average Risk  23.4   11.0        Use the calculated Patient Ratio above and the CHD Risk Table to  determine the patient's CHD Risk.        ATP III CLASSIFICATION (LDL):  <100     mg/dL   Optimal  409-811  mg/dL   Near or Above                    Optimal  130-159  mg/dL   Borderline  914-782  mg/dL   High  >956     mg/dL   Very High Performed at [facility], [address], [place], [place] [address]   . Hgb A1c MFr Bld 04/20/2022 4.7 (L)  4.8 - 5.6 % Final   Comment: (NOTE) Pre diabetes:          5.7%-6.4%  Diabetes:              >6.4%  Glycemic control for   <7.0% adults with diabetes   . Mean Plasma Glucose 04/20/2022 88.19  mg/dL Final   Performed at [facility], [address], [place], [place] [address]    Allergies: Penicillins  Medications:  Facility Ordered Medications  Medication  . acetaminophen (TYLENOL) tablet 650 mg  . alum & mag hydroxide-simeth (MAALOX/MYLANTA) 200-200-20 MG/5ML suspension 30 mL  . magnesium hydroxide (MILK OF MAGNESIA) suspension 30 mL  . OLANZapine (ZYPREXA) injection 5 mg  . ziprasidone (GEODON) injection 20 mg   And  . LORazepam (ATIVAN) tablet 1 mg  . [START ON 08/13/2022] OLANZapine (ZYPREXA) tablet 5 mg      Medical Decision Making  Recommend inpatient psychiatric admission for stabilization and treatment.  Reviewed blood work completed at [facility] and [facility]: CMP wnl, CBC w/diff wnl, BAL wnl.   Lab work ordered [employer]         POC urine preg, ED         POCT Urine Drug Screen - (I-Screen)     EKG  Medications started this encounter -Zyprexa 5 mg p.o. now and every morning for psychosis/mood lability  Agitation protocol medication -Geodon 20 mg every 12 hours, as needed, agitation and Ativan 1 mg, p.o., as needed, anxiety, severe agitation, for 1 dose  Other PRNs -Tylenol 650 mg p.o. every 6 hours as needed pain -Maalox 30 mL p.o. every 4 hours as needed indigestion -MOM 30 mL p.o. daily as needed constipation -Atarax 25 mg p.o. 3 times daily as needed anxiety -Trazodone 50 mg p.o. nightly  as needed insomnia  Recommendations  Based on my evaluation the patient does not appear to have an emergency medical condition.  Recommend inpatient psychiatric admission for stabilization and treatment.   Mancel Bale, NP 08/12/22  11:54 PM

## 2022-08-12 NOTE — BH Assessment (Signed)
Comprehensive Clinical Assessment (CCA) Note  08/12/2022 Christy [name] [id number]  DISPOSITION: Per Doran Heater NP pt is recommended for Inpatient psychiatric treatment.   The patient demonstrates the following risk factors for suicide: Chronic risk factors for suicide include: psychiatric disorder of psychotic d/o and substance use disorder. Acute risk factors for suicide include: family or marital conflict and unemployment. Protective factors for this patient include: positive social support and hope for the future. Considering these factors, the overall suicide risk at this point appears to be low. Patient is appropriate for outpatient follow up.   Per Triage assessment:  "Pt presents to [facility] IVC per GPD. PT is diagnosed with Acute Psychosis, Schizophrena, and Bipolar Disorder. Pt was unable to present her concern and appeared to be tangential. Pt states, "I am not a threat to my daugther and I just want my babies." Pt also states, "I do not feel like I need meds." Pt was unable to elaborate on if she is currently taking medications. Pt denied vitals and wrist band at this time. Pt was hard to assess and could not understand why she was brought to [facility]. Pt denies SI, HI and AVH. Pt denies using any substances at this time."  With further assessment: Pt denied SI, HI and AVH. Pt was brought in under IVC petitioned for by her mother, [name] [phone]) due to delusional thinking and hallucinations of a friend who died 2 years ago. Hx of schizophrenia, bipolar type. Per mother in IVC, pt has not been taking her prescribed medication. Per IVC, pt has been psychiatrically hospitalized previously with the last reported hospitalization occurring in April, 2024. Pt reportedly smokes cannabis regularly often daily.   Pt presented as alert but not fully oriented and possibly responding to internal stimuli. She was agitated, has been making animal sounds, insisting Security stay out of her  assessment room and insisting no one touch the walls. Pt displayed tangential thought and speech. Pt was disheveled and poorly groomed.  Pt was able to respond to a limited amount of questions due to psychotic symptoms.    Chief Complaint:  Chief Complaint  Patient presents with   IVC   Visit Diagnosis:  Brief Psychotic d/o Cannabis Use d/o, Severe   CCA Screening, Triage and Referral (STR)  Patient Reported Information How did you hear about [place]? Legal System  What Is the Reason for Your Visit/Call Today? Pt presents to [facility] IVC per GPD. PT is diagnosed with Acute Psychosis, Schizophrena, and Bipolar Disorder. Pt was unable to present her concern and appeared to be tangential. Pt states, "I am not a threat to my daugther and I just want my babies." Pt also states, "I do not feel like I need meds." Pt was unable to elaborate on if she is currently taking medications. Pt denied vitals and wrist band at this time. Pt was hard to assess and could not understand why she was brought to [facility]. Pt denies SI, HI and AVH. Pt denies using any substances at this time.  How Long Has This Been Causing You Problems? > than 6 months  What Do You Feel Would Help You the Most Today? Medication(s)   Have You Recently Had Any Thoughts About Hurting Yourself? No  Are You Planning to Commit Suicide/Harm Yourself At This time? No   Flowsheet Row ED from 08/12/2022 in [facility] Admission (Discharged) from 04/21/2022 in [facility] INPATIENT ADULT [facility]B ED from 04/20/2022 in [facility] Emergency Department at  [facility]  C-SSRS RISK CATEGORY No Risk No Risk No Risk       Have you Recently Had Thoughts About Hurting Someone Karolee Ohs? No  Are You Planning to Harm Someone at This Time? No  Explanation: na  Have You Used Any Alcohol or Drugs in the Past 24 Hours? No  What Did You Use and How Much? na  Do You Currently Have a Therapist/Psychiatrist? none  reported Name of Therapist/Psychiatrist: Name of Therapist/Psychiatrist: Pt and petitioner report pt is prescribed medications. Mother/petitioner stated that she is not taking her prescribed medications.   Have You Been Recently Discharged From Any Office Practice or Programs? No  Explanation of Discharge From Practice/Program: na     CCA Screening Triage Referral Assessment Type of Contact: Face-to-Face  Telemedicine Service Delivery:   Is this Initial or Reassessment?   Date Telepsych consult ordered in [facility]:    Time Telepsych consult ordered in [facility]:    Location of Assessment: [facility] Assessment Services  Provider Location: [facility] Assessment Services   Collateral Involvement: NP to call petitioner/mother, Chinta Wall [phone]   Does Patient Have a Court Appointed Legal Guardian? No  Legal Guardian Contact Information: na  Copy of Legal Guardianship Form: No - copy requested  Legal Guardian Notified of Arrival: -- (na)  Legal Guardian Notified of Pending Discharge: -- (na)  If Minor and Not Living with Parent(s), Who has Custody? adult  Is CPS involved or ever been involved? -- (none reported)  Is APS involved or ever been involved? -- (none reported)   Patient Determined To Be At Risk for Harm To Self or Others Based on Review of Patient Reported Information or Presenting Complaint? Yes, for Self-Harm  Method: No Plan  Availability of Means: No access or NA  Intent: Vague intent or NA  Notification Required: No need or identified person  Additional Information for Danger to Others Potential: Active psychosis  Additional Comments for Danger to Others Potential: na  Are There Guns or Other Weapons in Your Home? -- (Pt was unable to respond to questions due to psychotic symptoms.)  Types of Guns/Weapons: na  Are These Weapons Safely Secured?                            -- (Pt was unable to respond to questions due to psychotic symptoms.)  Who Could Verify  You Are Able To Have These Secured: na  Do You Have any Outstanding Charges, Pending Court Dates, Parole/Probation? Pt was unable to respond to questions due to psychotic symptoms.  Contacted To Inform of Risk of Harm To Self or Others: -- (na)    Does Patient Present under Involuntary Commitment? Yes    [place] of Residence: [place]   Patient Currently Receiving the Following Services: Not Receiving Services (none reported)   Determination of Need: Emergent (2 hours) (Per Doran Heater pt is recommended for Inpatient psychiatric treatment.)   Options For Referral: Inpatient Hospitalization     CCA Biopsychosocial Patient Reported Schizophrenia/Schizoaffective Diagnosis in Past: No (per chart)   Strengths: strong family support   Mental Health Symptoms Depression:   -- (Pt was unable to respond to questions due to psychotic symptoms.)   Duration of Depressive symptoms:    Mania:   -- (Pt was unable to respond to questions due to psychotic symptoms.)   Anxiety:    -- (Pt was unable to respond to questions due to psychotic symptoms.)  Psychosis:   Delusions; Hallucinations; Grossly disorganized or catatonic behavior; Grossly disorganized speech   Duration of Psychotic symptoms:  Duration of Psychotic Symptoms: Less than six months   Trauma:   -- (Pt was unable to respond to questions due to psychotic symptoms.)   Obsessions:   -- (Pt was unable to respond to questions due to psychotic symptoms.)   Compulsions:   -- (Pt was unable to respond to questions due to psychotic symptoms.)   Inattention:   N/A   Hyperactivity/Impulsivity:   N/A   Oppositional/Defiant Behaviors:   N/A   Emotional Irregularity:   -- (Pt was unable to respond to questions due to psychotic symptoms.)   Other Mood/Personality Symptoms:   none observed    Mental Status Exam Appearance and self-care  Stature:   Average   Weight:   Overweight   Clothing:   Casual    Grooming:   Neglected   Cosmetic use:   None   Posture/gait:   Normal   Motor activity:   Not Remarkable   Sensorium  Attention:   Confused; Distractible; Inattentive; Vigilant   Concentration:   Anxiety interferes; Focuses on irrelevancies; Preoccupied   Orientation:   -- (Pt was unable to respond to questions due to psychotic symptoms.)   Recall/memory:   -- (Pt was unable to respond to questions due to psychotic symptoms.)   Affect and Mood  Affect:   Anxious; Labile   Mood:   Anxious   Relating  Eye contact:   Fleeting; Avoided   Facial expression:   Tense   Attitude toward examiner:   Argumentative; Defensive; Guarded; Dramatic; Suspicious   Thought and Language  Speech flow:  Flight of Ideas   Thought content:   Delusions   Preoccupation:   -- (Pt was unable to respond to questions due to psychotic symptoms.)   Hallucinations:   -- (Pt was unable to respond to questions due to psychotic symptoms.)   Organization:   Disorganized   Company secretary of Knowledge:   -- (Pt was unable to respond to questions due to psychotic symptoms.)   Intelligence:   -- (Pt was unable to respond to questions due to psychotic symptoms.)   Abstraction:   -- (Pt was unable to respond to questions due to psychotic symptoms.)   Judgement:   Impaired   Reality Testing:   Distorted   Insight:   Lacking   Decision Making:   Confused   Social Functioning  Social Maturity:   -- (Pt was unable to respond to questions due to psychotic symptoms.)   Social Judgement:   -- (Pt was unable to respond to questions due to psychotic symptoms.)   Stress  Stressors:   -- (Pt was unable to respond to questions due to psychotic symptoms.)   Coping Ability:   Overwhelmed   Skill Deficits:   -- (Pt was unable to respond to questions due to psychotic symptoms.)   Supports:   Family     Religion: Religion/Spirituality Are You A Religious  Person?:  (Pt was unable to respond to questions due to psychotic symptoms.)  Leisure/Recreation: Leisure / Recreation Do You Have Hobbies?: Yes (per chart) Leisure and Hobbies: " doing hair "  Exercise/Diet: Exercise/Diet Do You Exercise?:  (Pt was unable to respond to questions due to psychotic symptoms.) Have You Gained or Lost A Significant Amount of Weight in the Past Six Months?:  (Pt was unable to respond to questions due to psychotic symptoms.) Do  You Follow a Special Diet?:  (Pt was unable to respond to questions due to psychotic symptoms.) Do You Have Any Trouble Sleeping?:  (Pt was unable to respond to questions due to psychotic symptoms.)   CCA Employment/Education Employment/Work Situation: Employment / Work Situation Employment Situation: Unemployed (per chart) Patient's Job has Been Impacted by Current Illness: No Has Patient ever Been in the [employer]?: No  Education: Education Is Patient Currently Attending School?:  (Pt was unable to respond to questions due to psychotic symptoms.) Last Grade Completed:  (Pt was unable to respond to questions due to psychotic symptoms.) Did You Attend College?:  (Pt was unable to respond to questions due to psychotic symptoms.) Did You Have An Individualized Education Program (IIEP):  (Pt was unable to respond to questions due to psychotic symptoms.) Did You Have Any Difficulty At School?:  (Pt was unable to respond to questions due to psychotic symptoms.)   CCA Family/Childhood History Family and Relationship History: Family history Marital status: Single Does patient have children?: Yes How many children?: 1 (per chart) How is patient's relationship with their children?: Pt has a daughter and stated, " I love her and she love me " per chart  Childhood History:  Childhood History By whom was/is the patient raised?: Mother, Grandparents (per chart) Did patient suffer any verbal/emotional/physical/sexual abuse as a child?:  No Has patient ever been sexually abused/assaulted/raped as an adolescent or adult?: No Witnessed domestic violence?: No Has patient been affected by domestic violence as an adult?: No       CCA Substance Use Alcohol/Drug Use: Alcohol / Drug Use Pain Medications: see MAR Prescriptions: see MAR Over the Counter: see MAR History of alcohol / drug use?: Yes Longest period of sobriety (when/how long): unknown Negative Consequences of Use:  (Pt was unable to respond to questions due to psychotic symptoms.) Withdrawal Symptoms:  (Pt was unable to respond to questions due to psychotic symptoms.) Substance #1 Name of Substance 1: cannabis 1 - Age of First Use: Pt was unable to respond to questions due to psychotic symptoms. 1 - Amount (size/oz): Pt was unable to respond to questions due to psychotic symptoms. 1 - Frequency: regularly, often daily 1 - Duration: ongoing 1 - Last Use / Amount: yesterday 1 - Method of Aquiring: unknown 1- Route of Use: smoke                       ASAM's:  Six Dimensions of Multidimensional Assessment  Dimension 1:  Acute Intoxication and/or Withdrawal Potential:   Dimension 1:  Description of individual's past and current experiences of substance use and withdrawal: none reported  Dimension 2:  Biomedical Conditions and Complications:   Dimension 2:  Description of patient's biomedical conditions and  complications: none reported  Dimension 3:  Emotional, Behavioral, or Cognitive Conditions and Complications:  Dimension 3:  Description of emotional, behavioral, or cognitive conditions and complications: Hx of psychostic d/o  Dimension 4:  Readiness to Change:     Dimension 5:  Relapse, Continued use, or Continued Problem Potential:     Dimension 6:  Recovery/Living Environment:     ASAM Severity Score: ASAM's Severity Rating Score: 8  ASAM Recommended Level of Treatment: ASAM Recommended Level of Treatment: Level I Outpatient Treatment    Substance use Disorder (SUD) Substance Use Disorder (SUD)  Checklist Symptoms of Substance Use:  (Pt was unable to respond to questions due to psychotic symptoms.)  Recommendations for Services/Supports/Treatments: Recommendations for Services/Supports/Treatments Recommendations  For Services/Supports/Treatments: CD-IOP Intensive Chemical Dependency Program, IOP (Intensive Outpatient Program)  Discharge Disposition:    DSM5 Diagnoses: Patient Active Problem List   Diagnosis Date Noted   Cannabis use disorder 04/28/2022   Psychosis, brief reactive (HCC) 04/21/2022   Brief psychotic disorder (HCC) 04/21/2022   Postpartum care following vaginal delivery 03/23/2019   Contraception management 03/23/2019   History of gestational diabetes 03/23/2019     Referrals to Alternative Service(s): Referred to Alternative Service(s):   Place:   Date:   Time:    Referred to Alternative Service(s):   Place:   Date:   Time:    Referred to Alternative Service(s):   Place:   Date:   Time:    Referred to Alternative Service(s):   Place:   Date:   Time:     Airiana Elman T, Counselor

## 2022-08-12 NOTE — ED Notes (Signed)
Multiple attempts were made by several different staff members in order to obtain EKG. Patient adamantly continues to refuse with all attempts made.

## 2022-08-12 NOTE — Progress Notes (Signed)
   08/12/22 1312  [facility] Triage Screening (Walk-ins at [facility] only)  How Did You Hear About [place]? Legal System  What Is the Reason for Your Visit/Call Today? Pt presents to [facility] IVC per GPD. PT is diagnosed with Acute Psychosis, Schizophrena, and Bipolar Disorder. Pt was unable to present her concern and appeared to be tangential. Pt states, "I am not a threat to my daugther and I just want my babies." Pt also states, "I do not feel like I need meds." Pt was unable to elaborate on if she is currently taking medications. Pt denied vitals and wrist band at this time. Pt was hard to assess and could not understand why she was brought to [facility]. Pt denies SI, HI and AVH. Pt denies using any substances at this time.  How Long Has This Been Causing You Problems? > than 6 months  Have You Recently Had Any Thoughts About Hurting Yourself? No  Are You Planning to Commit Suicide/Harm Yourself At This time? No  Have you Recently Had Thoughts About Hurting Someone Karolee Ohs? No  Are You Planning To Harm Someone At This Time? No  Are you currently experiencing any auditory, visual or other hallucinations? No  Have You Used Any Alcohol or Drugs in the Past 24 Hours? No  Do you have any current medical co-morbidities that require immediate attention? No  Clinician description of patient physical appearance/behavior: Tangential speech, poorly groomed, agitated  What Do You Feel Would Help You the Most Today? Medication(s)  If access to [facility] was not available, would you have sought care in the Emergency Department? No  Determination of Need Urgent (48 hours)  Options For Referral [facility]

## 2022-08-12 NOTE — ED Notes (Addendum)
Pr refused UDS at this time and DC VS

## 2022-08-12 NOTE — ED Provider Notes (Signed)
[facility] Continuous Assessment Admission H&P  Date: 08/13/22 Patient Name: Christy [name] MRN: [id number] Chief Complaint: "I feel out of my body".   Diagnoses:  Final diagnoses:  Psychosis, unspecified psychosis type (HCC)  Marijuana use    HPI: Christy [name]. [name] is a 27 year old female with psychiatric history of  Brief psychotic disorder, schizophrenia, bipolar type, and psychosis who initially presented to [facility] behavioral health via law enforcement under Involuntary commitment petition initiated by patient's mother, [name] [phone]) due to delusional thinking and hallucinations of a friend who died 2 years ago and medication non-compliance.    Petition reads: "Respondent has been diagnosed with acute psychosis and bipolar disorder.  Respondent does not take prescribed medication.  Respondent has been committed back in April to [facility] behavioral health.  Respondent believes that her house keys fell out of her buttocks.  Respondent believes that her eyes are wet but she is not crying and that she is coughing up blood.  A friend passed away 2 years ago and the respondent believes that she sees her on the TV.  Respondent makes growling noises and speaks very loud and aggressive.  Respondent does smoke marijuana daily or up to 3-4 times a week.   Patient was evaluated and transferred to [facility] for medical clearance because she was noncompliant with staff, refusing vital signs, labs, and urine collection. Patient was evaluated at the ED, stabilized, and transferred back to the [facility] to continue her psychiatric care.   Patient was seen face-to-face by this provider and chart reviewed.  Per chart review, patient was last admitted inpatient at [facility] between 04/21/2022-04/28/2022 for acute psychosis.    On evaluation, patient is alert, no focal deficit, and uncooperative. Speech is blocked, with decreased volume. Pt appears casual. Eye contact is fair. Mood is labile,  affect is congruent with mood. Thought process is disorganized, tangential, and thought content is scattered. Pt denied SI/HI/AVH. There is objective indication that the patient is responding to internal stimuli as she giggles, mumbles, and stares off with a blank look at times. No delusions elicited during this assessment.   During this evaluation, Patient reports " I have to go to court, I don't want to talk about it unless he's here, I feel out of it, my body is wet, my skin is breaking out, that's my fault, but anything else extra is extra, so I had to like a ...... I had to umm........ sit in the hospital and be evaluated, and make sure I'm not insane in March".  Patient is unable to engage with provider and appears to be responding to internal stimuli with long periods of silence in between questions.  Other bizarre behaviors include giggling, mumbling, staring at provider intently and sometimes with a blank look.   Patient is recommended for inpatient psychiatric admission for stabilization and treatment.  Support, encouragement, reassurance provided about ongoing stressors.  Patient is provided with opportunity for questions.  Total Time spent with patient: 20 minutes  Musculoskeletal  Strength & Muscle Tone: within normal limits Gait & Station: normal Patient leans: N/A  Psychiatric Specialty Exam  Presentation General Appearance:  Casual  Eye Contact: Fair  Speech: Blocked  Speech Volume: Decreased  Handedness: Right   Mood and Affect  Mood: Labile  Affect: Labile   Thought Process  Thought Processes: Disorganized  Descriptions of Associations:Tangential  Orientation:Partial  Thought Content:Tangential; Scattered  Diagnosis of Schizophrenia or Schizoaffective disorder in past: No  Duration of Psychotic  Symptoms: Less than six months  Hallucinations:Hallucinations: Other (comment) (Pt reports talking to herself sometimes)  Ideas of  Reference:Paranoia  Suicidal Thoughts:Suicidal Thoughts: No  Homicidal Thoughts:Homicidal Thoughts: No   Sensorium  Memory: Immediate Poor  Judgment: Impaired  Insight: Lacking   Executive Functions  Concentration: Poor  Attention Span: Poor  Recall: Poor  Fund of Knowledge: Poor  Language: Poor   Psychomotor Activity  Psychomotor Activity: Psychomotor Activity: Normal   Assets  Assets: Communication Skills; Desire for Improvement   Sleep  Sleep: Sleep: Poor   Nutritional Assessment (For OBS and FBC admissions only) Has the patient had a weight loss or gain of 10 pounds or more in the last 3 months?: No Has the patient had a decrease in food intake/or appetite?: No Does the patient have dental problems?: No Does the patient have eating habits or behaviors that may be indicators of an eating disorder including binging or inducing vomiting?: No Has the patient recently lost weight without trying?: 0 Has the patient been eating poorly because of a decreased appetite?: 0 Malnutrition Screening Tool Score: 0    Physical Exam Constitutional:      General: She is not in acute distress.    Appearance: She is not diaphoretic.  HENT:     Head: Normocephalic.     Right Ear: External ear normal.     Left Ear: External ear normal.     Nose: No congestion.  Eyes:     General:        Right eye: No discharge.        Left eye: No discharge.  Pulmonary:     Effort: No respiratory distress.  Chest:     Chest wall: No tenderness.  Neurological:     General: No focal deficit present.     Mental Status: She is alert.  Psychiatric:        Attention and Perception: She perceives visual hallucinations.        Mood and Affect: Affect is labile and inappropriate.        Speech: Speech is tangential.        Behavior: Behavior is uncooperative.        Thought Content: Thought content is not paranoid or delusional. Thought content does not include homicidal or  suicidal ideation. Thought content does not include homicidal or suicidal plan.        Cognition and Memory: Cognition is impaired.        Judgment: Judgment is inappropriate.    Review of Systems  Constitutional:  Negative for chills and diaphoresis.  HENT:  Negative for congestion.   Eyes:  Negative for discharge.  Respiratory:  Negative for cough, shortness of breath and wheezing.   Cardiovascular:  Negative for chest pain and palpitations.  Gastrointestinal:  Negative for diarrhea, nausea and vomiting.  Neurological:  Negative for dizziness, seizures, loss of consciousness, weakness and headaches.     Past Psychiatric History: See H & P   Is the patient at risk to self? Yes  Has the patient been a risk to self in the past 6 months? Yes .    Has the patient been a risk to self within the distant past? Yes   Is the patient a risk to others? No   Has the patient been a risk to others in the past 6 months? Yes   Has the patient been a risk to others within the distant past?  unknown  Past Medical History: See Chart  Family History: N/A  Social History: N/A  Last Labs:  Admission on 08/12/2022, Discharged on 08/12/2022  Component Date Value Ref Range Status   WBC 08/12/2022 6.8  4.0 - 10.5 K/uL Final   RBC 08/12/2022 4.31  3.87 - 5.11 MIL/uL Final   Hemoglobin 08/12/2022 13.9  12.0 - 15.0 g/dL Final   HCT 36/64/4034 40.8  36.0 - 46.0 % Final   MCV 08/12/2022 94.7  80.0 - 100.0 fL Final   MCH 08/12/2022 32.3  26.0 - 34.0 pg Final   MCHC 08/12/2022 34.1  30.0 - 36.0 g/dL Final   RDW 74/25/9563 11.5  11.5 - 15.5 % Final   Platelets 08/12/2022 284  150 - 400 K/uL Final   nRBC 08/12/2022 0.0  0.0 - 0.2 % Final   Neutrophils Relative % 08/12/2022 75  % Final   Neutro Abs 08/12/2022 5.0  1.7 - 7.7 K/uL Final   Lymphocytes Relative 08/12/2022 22  % Final   Lymphs Abs 08/12/2022 1.5  0.7 - 4.0 K/uL Final   Monocytes Relative 08/12/2022 3  % Final   Monocytes Absolute 08/12/2022  0.2  0.1 - 1.0 K/uL Final   Eosinophils Relative 08/12/2022 0  % Final   Eosinophils Absolute 08/12/2022 0.0  0.0 - 0.5 K/uL Final   Basophils Relative 08/12/2022 0  % Final   Basophils Absolute 08/12/2022 0.0  0.0 - 0.1 K/uL Final   Immature Granulocytes 08/12/2022 0  % Final   Abs Immature Granulocytes 08/12/2022 0.01  0.00 - 0.07 K/uL Final   Performed at [facility], [address], [place], [place] [address]   Sodium 08/12/2022 135  135 - 145 mmol/L Final   Potassium 08/12/2022 3.8  3.5 - 5.1 mmol/L Final   Chloride 08/12/2022 105  98 - 111 mmol/L Final   CO2 08/12/2022 21 (L)  22 - 32 mmol/L Final   Glucose, Bld 08/12/2022 80  70 - 99 mg/dL Final   Glucose reference range applies only to samples taken after fasting for at least 8 hours.   BUN 08/12/2022 12  6 - 20 mg/dL Final   Creatinine, Ser 08/12/2022 0.76  0.44 - 1.00 mg/dL Final   Calcium 33/29/5188 9.0  8.9 - 10.3 mg/dL Final   Total Protein 41/66/0630 7.2  6.5 - 8.1 g/dL Final   Albumin 16/01/930 4.3  3.5 - 5.0 g/dL Final   AST 35/57/3220 15  15 - 41 U/L Final   ALT 08/12/2022 13  0 - 44 U/L Final   Alkaline Phosphatase 08/12/2022 39  38 - 126 U/L Final   Total Bilirubin 08/12/2022 0.9  0.3 - 1.2 mg/dL Final   GFR, Estimated 08/12/2022 >60  >60 mL/min Final   Comment: (NOTE) Calculated using the CKD-EPI Creatinine Equation (2021)    Anion gap 08/12/2022 9  5 - 15 Final   Performed at [facility], [address] [place], [place] [address]   Alcohol, Ethyl (B) 08/12/2022 <10  <10 mg/dL Final   Comment: (NOTE) Lowest detectable limit for serum alcohol is 10 mg/dL.  For medical purposes only. Performed at [facility], [address], [place], [place] [address]   Admission on 08/12/2022, Discharged on 08/12/2022  Component Date Value Ref Range Status   Sodium 08/12/2022 140  135 - 145 mmol/L Final   Potassium 08/12/2022 3.4 (L)  3.5 - 5.1 mmol/L Final   Chloride 08/12/2022 102  98 - 111 mmol/L  Final   CO2 08/12/2022 24  22 - 32 mmol/L Final   Glucose, Bld 08/12/2022 160 (H)  70 - 99 mg/dL Final   Glucose reference range applies only to samples taken after fasting for at least 8 hours.   BUN 08/12/2022 19  6 - 20 mg/dL Final   Creatinine, Ser 08/12/2022 0.96  0.44 - 1.00 mg/dL Final   Calcium 78/29/5621 8.5 (L)  8.9 - 10.3 mg/dL Final   Total Protein 30/86/5784 6.4 (L)  6.5 - 8.1 g/dL Final   Albumin 69/62/9528 3.6  3.5 - 5.0 g/dL Final   AST 41/32/4401 78 (H)  15 - 41 U/L Final   ALT 08/12/2022 57 (H)  0 - 44 U/L Final   Alkaline Phosphatase 08/12/2022 68  38 - 126 U/L Final   Total Bilirubin 08/12/2022 0.5  0.3 - 1.2 mg/dL Final   GFR, Estimated 08/12/2022 >60  >60 mL/min Final   Comment: (NOTE) Calculated using the CKD-EPI Creatinine Equation (2021)    Anion gap 08/12/2022 14  5 - 15 Final   Performed at [facility], [address], [place], [place] [address]   Magnesium 08/12/2022 2.1  1.7 - 2.4 mg/dL Final   Performed at [facility], [address] [place], [place] [address]  Admission on 04/20/2022, Discharged on 04/21/2022  Component Date Value Ref Range Status   WBC 04/20/2022 9.3  4.0 - 10.5 K/uL Final   RBC 04/20/2022 4.20  3.87 - 5.11 MIL/uL Final   Hemoglobin 04/20/2022 13.4  12.0 - 15.0 g/dL Final   HCT 03/47/4259 38.3  36.0 - 46.0 % Final   MCV 04/20/2022 91.2  80.0 - 100.0 fL Final   MCH 04/20/2022 31.9  26.0 - 34.0 pg Final   MCHC 04/20/2022 35.0  30.0 - 36.0 g/dL Final   RDW 56/38/7564 11.6  11.5 - 15.5 % Final   Platelets 04/20/2022 254  150 - 400 K/uL Final   nRBC 04/20/2022 0.0  0.0 - 0.2 % Final   Neutrophils Relative % 04/20/2022 73  % Final   Neutro Abs 04/20/2022 6.8  1.7 - 7.7 K/uL Final   Lymphocytes Relative 04/20/2022 21  % Final   Lymphs Abs 04/20/2022 1.9  0.7 - 4.0 K/uL Final   Monocytes Relative 04/20/2022 5  % Final   Monocytes Absolute 04/20/2022 0.5  0.1 - 1.0 K/uL Final   Eosinophils Relative 04/20/2022 0  % Final    Eosinophils Absolute 04/20/2022 0.0  0.0 - 0.5 K/uL Final   Basophils Relative 04/20/2022 1  % Final   Basophils Absolute 04/20/2022 0.1  0.0 - 0.1 K/uL Final   Immature Granulocytes 04/20/2022 0  % Final   Abs Immature Granulocytes 04/20/2022 0.02  0.00 - 0.07 K/uL Final   Performed at [facility], [address] [place], [place] [address]   Sodium 04/20/2022 139  135 - 145 mmol/L Final   Potassium 04/20/2022 3.5  3.5 - 5.1 mmol/L Final   Chloride 04/20/2022 107  98 - 111 mmol/L Final   CO2 04/20/2022 25  22 - 32 mmol/L Final   Glucose, Bld 04/20/2022 83  70 - 99 mg/dL Final   Glucose reference range applies only to samples taken after fasting for at least 8 hours.   BUN 04/20/2022 5 (L)  6 - 20 mg/dL Final   Creatinine, Ser 04/20/2022 0.80  0.44 - 1.00 mg/dL Final   Calcium 18/84/1660 8.9  8.9 - 10.3 mg/dL Final   Total Protein 63/01/6008 7.5  6.5 -  8.1 g/dL Final   Albumin 16/10/9602 4.5  3.5 - 5.0 g/dL Final   AST 54/09/8117 15  15 - 41 U/L Final   ALT 04/20/2022 12  0 - 44 U/L Final   Alkaline Phosphatase 04/20/2022 35 (L)  38 - 126 U/L Final   Total Bilirubin 04/20/2022 0.6  0.3 - 1.2 mg/dL Final   GFR, Estimated 04/20/2022 >60  >60 mL/min Final   Comment: (NOTE) Calculated using the CKD-EPI Creatinine Equation (2021)    Anion gap 04/20/2022 7  5 - 15 Final   Performed at [facility], [address], [place], [place] [address]   I-stat hCG, quantitative 04/20/2022 <5.0  <5 mIU/mL Final   Comment 3 04/20/2022          Final   Comment:   GEST. AGE      CONC.  (mIU/mL)   <=1 WEEK        5 - 50     2 WEEKS       50 - 500     3 WEEKS       100 - 10,000     4 WEEKS     1,000 - 30,000        FEMALE AND NON-PREGNANT FEMALE:     LESS THAN 5 mIU/mL    Opiates 04/20/2022 NONE DETECTED  NONE DETECTED Final   Cocaine 04/20/2022 NONE DETECTED  NONE DETECTED Final   Benzodiazepines 04/20/2022 NONE DETECTED  NONE DETECTED Final   Amphetamines  04/20/2022 NONE DETECTED  NONE DETECTED Final   Tetrahydrocannabinol 04/20/2022 POSITIVE (A)  NONE DETECTED Final   Barbiturates 04/20/2022 NONE DETECTED  NONE DETECTED Final   Comment: (NOTE) DRUG SCREEN FOR MEDICAL PURPOSES ONLY.  IF CONFIRMATION IS NEEDED FOR ANY PURPOSE, NOTIFY LAB WITHIN 5 DAYS.  LOWEST DETECTABLE LIMITS FOR URINE DRUG SCREEN Drug Class                     Cutoff (ng/mL) Amphetamine and metabolites    1000 Barbiturate and metabolites    200 Benzodiazepine                 200 Opiates and metabolites        300 Cocaine and metabolites        300 THC                            50 Performed at [facility], [address], [place], [place] [address]    Alcohol, Ethyl (B) 04/20/2022 <10  <10 mg/dL Final   Comment: (NOTE) Lowest detectable limit for serum alcohol is 10 mg/dL.  For medical purposes only. Performed at [facility], [address], [place], [place] [address]    Salicylate Lvl 04/20/2022 <7.0 (L)  7.0 - 30.0 mg/dL Final   Performed at [facility], [address] [place], [place] [address]   Acetaminophen (Tylenol), Serum 04/20/2022 <10 (L)  10 - 30 ug/mL Final   Comment: (NOTE) Therapeutic concentrations vary significantly. A range of 10-30 ug/mL  may be an effective concentration for many patients. However, some  are best treated at concentrations outside of this range. Acetaminophen concentrations >150 ug/mL at 4 hours after ingestion  and >50 ug/mL at 12 hours after ingestion are often associated with  toxic reactions.  Performed at [facility], [address], [place], [place] [address]  SARS Coronavirus 2 by RT PCR 04/20/2022 NEGATIVE  NEGATIVE Final   Comment: (NOTE) SARS-CoV-2 target nucleic acids are NOT DETECTED.  The SARS-CoV-2 RNA is generally detectable in upper respiratory specimens during the acute phase of infection. The lowest concentration of  SARS-CoV-2 viral copies this assay can detect is 138 copies/mL. A negative result does not preclude SARS-Cov-2 infection and should not be used as the sole basis for treatment or other patient management decisions. A negative result may occur with  improper specimen collection/handling, submission of specimen other than nasopharyngeal swab, presence of viral mutation(s) within the areas targeted by this assay, and inadequate number of viral copies(<138 copies/mL). A negative result must be combined with clinical observations, patient history, and epidemiological information. The expected result is Negative.  Fact Sheet for Patients:  BloggerCourse.com  Fact Sheet for Healthcare Providers:  SeriousBroker.it  This test is no                          t yet approved or cleared by the [place] FDA and  has been authorized for detection and/or diagnosis of SARS-CoV-2 by FDA under an Emergency Use Authorization (EUA). This EUA will remain  in effect (meaning this test can be used) for the duration of the COVID-19 declaration under Section 564(b)(1) of the Act, 21 U.S.C.section 360bbb-3(b)(1), unless the authorization is terminated  or revoked sooner.       Influenza A by PCR 04/20/2022 NEGATIVE  NEGATIVE Final   Influenza B by PCR 04/20/2022 NEGATIVE  NEGATIVE Final   Comment: (NOTE) The Xpert Xpress SARS-CoV-2/FLU/RSV plus assay is intended as an aid in the diagnosis of influenza from Nasopharyngeal swab specimens and should not be used as a sole basis for treatment. Nasal washings and aspirates are unacceptable for Xpert Xpress SARS-CoV-2/FLU/RSV testing.  Fact Sheet for Patients: BloggerCourse.com  Fact Sheet for Healthcare Providers: SeriousBroker.it  This test is not yet approved or cleared by the [place] FDA and has been authorized for detection and/or diagnosis of  SARS-CoV-2 by FDA under an Emergency Use Authorization (EUA). This EUA will remain in effect (meaning this test can be used) for the duration of the COVID-19 declaration under Section 564(b)(1) of the Act, 21 U.S.C. section 360bbb-3(b)(1), unless the authorization is terminated or revoked.     Resp Syncytial Virus by PCR 04/20/2022 NEGATIVE  NEGATIVE Final   Comment: (NOTE) Fact Sheet for Patients: BloggerCourse.com  Fact Sheet for Healthcare Providers: SeriousBroker.it  This test is not yet approved or cleared by the [place] FDA and has been authorized for detection and/or diagnosis of SARS-CoV-2 by FDA under an Emergency Use Authorization (EUA). This EUA will remain in effect (meaning this test can be used) for the duration of the COVID-19 declaration under Section 564(b)(1) of the Act, 21 U.S.C. section 360bbb-3(b)(1), unless the authorization is terminated or revoked.  Performed at [facility], [address], [place], [place] [address]    TSH 04/20/2022 1.707  0.350 - 4.500 uIU/mL Final   Comment: Performed by a 3rd Generation assay with a functional sensitivity of <=0.01 uIU/mL. Performed at [facility], [address] [place], [place] [address]    Cholesterol 04/20/2022 180  0 - 200 mg/dL Final   Triglycerides 02/72/5366 88  <150 mg/dL Final   HDL 44/03/4740 42  >40 mg/dL Final   Total CHOL/HDL Ratio 04/20/2022 4.3  RATIO Final   VLDL 04/20/2022 18  0 - 40  mg/dL Final   LDL Cholesterol 04/20/2022 120 (H)  0 - 99 mg/dL Final   Comment:        Total Cholesterol/HDL:CHD Risk Coronary Heart Disease Risk Table                     Men   Women  1/2 Average Risk   3.4   3.3  Average Risk       5.0   4.4  2 X Average Risk   9.6   7.1  3 X Average Risk  23.4   11.0        Use the calculated Patient Ratio above and the CHD Risk Table to determine the patient's CHD Risk.         ATP III CLASSIFICATION (LDL):  <100     mg/dL   Optimal  161-096  mg/dL   Near or Above                    Optimal  130-159  mg/dL   Borderline  045-409  mg/dL   High  >811     mg/dL   Very High Performed at [facility], [address] [place], [place] [address]    Hgb A1c MFr Bld 04/20/2022 4.7 (L)  4.8 - 5.6 % Final   Comment: (NOTE) Pre diabetes:          5.7%-6.4%  Diabetes:              >6.4%  Glycemic control for   <7.0% adults with diabetes    Mean Plasma Glucose 04/20/2022 88.19  mg/dL Final   Performed at [facility], [address] [place], [place] [address]    Allergies: Penicillins  Medications:  Facility Ordered Medications  Medication   acetaminophen (TYLENOL) tablet 650 mg   alum & mag hydroxide-simeth (MAALOX/MYLANTA) 200-200-20 MG/5ML suspension 30 mL   magnesium hydroxide (MILK OF MAGNESIA) suspension 30 mL   OLANZapine (ZYPREXA) injection 5 mg   ziprasidone (GEODON) injection 20 mg   And   LORazepam (ATIVAN) tablet 1 mg   OLANZapine (ZYPREXA) tablet 5 mg   hydrOXYzine (ATARAX) tablet 25 mg   traZODone (DESYREL) tablet 50 mg      Medical Decision Making  Recommend inpatient psychiatric admission for stabilization and treatment.  Reviewed blood work completed at [facility] and [facility]: CMP wnl, CBC w/diff wnl, BAL wnl.  Lab work ordered [employer]         POC urine preg, ED         POCT Urine Drug Screen - (I-Screen)     EKG  Medications started this encounter -Zyprexa 5 mg p.o.every morning for psychosis/mood lability -Zyprexa 5 mg IM x 1 dose for psychosis   Agitation protocol medication -Geodon 20 mg every 12 hours, as needed,for agitation and Ativan 1 mg, p.o, as needed, anxiety, severe agitation, for 1 dose  Other PRNs -Tylenol 650 mg p.o. every 6 hours as needed pain -Maalox 30 mL p.o. every 4 hours as needed indigestion -MOM 30 mL p.o. daily as needed constipation -Atarax 25 mg p.o. 3 times daily as needed  anxiety -Trazodone 50 mg p.o. nightly as needed insomnia  Recommendations  Based on my evaluation the patient does not appear to have an emergency medical condition.  Recommend inpatient psychiatric admission for stabilization and treatment.   Mancel Bale, NP 08/13/22  12:06 AM

## 2022-08-12 NOTE — ED Notes (Signed)
Pt was unable to complete vitals. Pt refused to allow MHT to put BP cuff on and insisted on placing it on her arm herself. Pt refused to uncross her legs and was very fidgety.

## 2022-08-13 MED ORDER — TRAZODONE HCL 50 MG PO TABS: 50.0000 mg | ORAL_TABLET | Freq: Every evening | ORAL | Status: DC | PRN

## 2022-08-13 MED ORDER — OLANZAPINE 5 MG PO TABS: 5.0000 mg | ORAL_TABLET | Freq: Every day | ORAL | Status: DC

## 2022-08-13 MED ORDER — HYDROXYZINE HCL 25 MG PO TABS: 25.0000 mg | ORAL_TABLET | Freq: Three times a day (TID) | ORAL | Status: DC | PRN

## 2022-08-13 NOTE — Progress Notes (Signed)
LCSW Progress Note  [id number]   Christy [name]  08/13/2022  1:39 PM  Description:   Inpatient Psychiatric Referral  Patient was recommended inpatient per Doran Heater, NP. There are no available beds at [facility], per [facility] Rosey Bath, RN. Patient was referred to the following out of network facilities:   Destination  Service Provider Address Phone Fax  [facility]  [address] [place], [place] [address] [phone]  [facility]  [address] [place] [address] [phone]  [facility]  [address] [place] [address] [phone]  [facility]  [address] [place], [place] [address] [phone]  [facility]  [address] [place] [address] [phone]  [facility]  [address], [place] [address] [phone]  [facility]  [address] [place] [address] [phone]  [facility]  [address] [place] [address] [phone]  [facility]  [address] [place] [address] [phone]  [facility]  [address], [place] [address] [phone]  [facility]  [address] [place] [address] [phone]  [facility]  [address] [place] [address] [phone]  [facility]  [address], [place] [address] [phone]  [facility]  [address], [place] [address] [phone]  [facility]  [address], [place] [address] [phone]  [facility] [place] [facility]  [address] [place] [address] [phone]  [facility]  [address], [place] [address] [phone]  [facility]  [address] [place] [address] [phone]  [facility]  [address], [place] [address] [phone]  [facility] [address] [place]  [address], [place] [address] [phone]  [facility]  [address] [place], [place] [address] [phone]  [facility] [address]  [address] [place] [address] [phone]  [facility]  [address] [place] [address] [phone]  [facility]  [address] [place] [address] [phone]  [facility]  [address] [place] [address] [phone]  [facility]  [phone] [address] [place] [address] [phone]  [facility]  [address] [place] [address] [phone]  [facility]  [address] [place] [address] [phone]  [facility]  [address] [place] [address] [phone]  [facility]  [address] [place] [address] [phone]    Situation ongoing, CSW to continue following and update chart as more information becomes available.      Cathie Beams, [place]  08/13/2022 1:39 PM

## 2022-08-13 NOTE — ED Notes (Signed)
Patient sitting in chair. Patient appears agitated and refused breakfast when offered. Patient is labile. Encouragement and support provided and safety maintain.

## 2022-08-13 NOTE — ED Notes (Signed)
Patient refused dinner but accepted snacks.

## 2022-08-13 NOTE — ED Notes (Signed)
Patient resting quietly in chair.  Respirations equal and unlabored, skin warm and dry, NAD.Encouragement and support provided. Routine safety checks conducted according to facility protocol. Will continue to monitor for safety.

## 2022-08-13 NOTE — ED Provider Notes (Signed)
Behavioral Health Progress Note  Date and Time: 08/13/2022 9:39 AM Name: Christy [name] MRN:  [id number]  Subjective: Patient states "you said the case it is the exact same thing."  Christy [name] is reassessed by this nurse practitioner face-to-face.  She is seated in assessment area, denies physical complaint.  She is alert and oriented, minimally cooperative during assessment.  Patient presents with anxious mood, labile affect.  Patient exhibits tangential and disorganized thought content.  She continues to endorse paranoid ideations.  Patient states "I need to get a restraining order against her (points toward unoccupied chair) can you call the police?"  She appears to be responding to internal stimuli.  Patient turns and looks out the window states "I think there are apartments over there, I think there is a party."  Patient does not answer when questioned regarding suicidal or suicidal thoughts.  Patient states "are you serious?"  She laughs inappropriately intermittently during assessment.  Christy [name] has reportedly attempted to take belongings from a staff member.  She is also refusing medications.  Per attending staff member patient slept very little to out last night.  Patient offered support and encouragement.  Reviewed treatment plan to include inpatient psychiatric admission.  Patient currently IVC.  Chart reviewed and patient discussed with Dr. Nelly Rout on 08/13/2022.  Diagnosis:  Final diagnoses:  Psychosis, unspecified psychosis type (HCC)  Marijuana use    Total Time spent with patient: 30 minutes  Past Psychiatric History: Psychosis, depression Past Medical History: Bacterial vaginitis, gestational diabetes, hyperemesis, nausea and vomiting, gastroenteritis Family History: None reported Family Psychiatric  History: None reported Social History: Resides in [place], endorses marijuana use several times per week, currently unemployed  Additional Social History: N/A                         Sleep: Poor  Appetite:  Fair  Current Medications:  Current Facility-Administered Medications  Medication Dose Route Frequency Provider Last Rate Last Admin   acetaminophen (TYLENOL) tablet 650 mg  650 mg Oral Q6H PRN Onuoha, Chinwendu V, NP       alum & mag hydroxide-simeth (MAALOX/MYLANTA) 200-200-20 MG/5ML suspension 30 mL  30 mL Oral Q4H PRN Onuoha, Chinwendu V, NP       hydrOXYzine (ATARAX) tablet 25 mg  25 mg Oral TID PRN Onuoha, Chinwendu V, NP       ziprasidone (GEODON) injection 20 mg  20 mg Intramuscular Q12H PRN Onuoha, Chinwendu V, NP       And   LORazepam (ATIVAN) tablet 1 mg  1 mg Oral PRN Onuoha, Chinwendu V, NP       magnesium hydroxide (MILK OF MAGNESIA) suspension 30 mL  30 mL Oral Daily PRN Onuoha, Chinwendu V, NP       OLANZapine (ZYPREXA) injection 5 mg  5 mg Intramuscular Once Onuoha, Chinwendu V, NP       OLANZapine (ZYPREXA) tablet 5 mg  5 mg Oral Daily Onuoha, Chinwendu V, NP       traZODone (DESYREL) tablet 50 mg  50 mg Oral QHS PRN Onuoha, Chinwendu V, NP       No current outpatient medications on file.    Labs  Lab Results:  Admission on 08/12/2022, Discharged on 08/12/2022  Component Date Value Ref Range Status   WBC 08/12/2022 6.8  4.0 - 10.5 K/uL Final   RBC 08/12/2022 4.31  3.87 - 5.11 MIL/uL Final   Hemoglobin 08/12/2022 13.9  12.0 - 15.0 g/dL Final  HCT 08/12/2022 40.8  36.0 - 46.0 % Final   MCV 08/12/2022 94.7  80.0 - 100.0 fL Final   MCH 08/12/2022 32.3  26.0 - 34.0 pg Final   MCHC 08/12/2022 34.1  30.0 - 36.0 g/dL Final   RDW 81/19/1478 11.5  11.5 - 15.5 % Final   Platelets 08/12/2022 284  150 - 400 K/uL Final   nRBC 08/12/2022 0.0  0.0 - 0.2 % Final   Neutrophils Relative % 08/12/2022 75  % Final   Neutro Abs 08/12/2022 5.0  1.7 - 7.7 K/uL Final   Lymphocytes Relative 08/12/2022 22  % Final   Lymphs Abs 08/12/2022 1.5  0.7 - 4.0 K/uL Final   Monocytes Relative 08/12/2022 3  % Final   Monocytes Absolute  08/12/2022 0.2  0.1 - 1.0 K/uL Final   Eosinophils Relative 08/12/2022 0  % Final   Eosinophils Absolute 08/12/2022 0.0  0.0 - 0.5 K/uL Final   Basophils Relative 08/12/2022 0  % Final   Basophils Absolute 08/12/2022 0.0  0.0 - 0.1 K/uL Final   Immature Granulocytes 08/12/2022 0  % Final   Abs Immature Granulocytes 08/12/2022 0.01  0.00 - 0.07 K/uL Final   Performed at [facility], [address] [place], [place] [address]   Sodium 08/12/2022 135  135 - 145 mmol/L Final   Potassium 08/12/2022 3.8  3.5 - 5.1 mmol/L Final   Chloride 08/12/2022 105  98 - 111 mmol/L Final   CO2 08/12/2022 21 (L)  22 - 32 mmol/L Final   Glucose, Bld 08/12/2022 80  70 - 99 mg/dL Final   Glucose reference range applies only to samples taken after fasting for at least 8 hours.   BUN 08/12/2022 12  6 - 20 mg/dL Final   Creatinine, Ser 08/12/2022 0.76  0.44 - 1.00 mg/dL Final   Calcium 13/08/6576 9.0  8.9 - 10.3 mg/dL Final   Total Protein 46/96/2952 7.2  6.5 - 8.1 g/dL Final   Albumin 84/13/2440 4.3  3.5 - 5.0 g/dL Final   AST 11/07/2534 15  15 - 41 U/L Final   ALT 08/12/2022 13  0 - 44 U/L Final   Alkaline Phosphatase 08/12/2022 39  38 - 126 U/L Final   Total Bilirubin 08/12/2022 0.9  0.3 - 1.2 mg/dL Final   GFR, Estimated 08/12/2022 >60  >60 mL/min Final   Comment: (NOTE) Calculated using the CKD-EPI Creatinine Equation (2021)    Anion gap 08/12/2022 9  5 - 15 Final   Performed at [facility], [address] [place], [place] [address]   Alcohol, Ethyl (B) 08/12/2022 <10  <10 mg/dL Final   Comment: (NOTE) Lowest detectable limit for serum alcohol is 10 mg/dL.  For medical purposes only. Performed at [facility], [address] [place], [place] [address]   Admission on 08/12/2022, Discharged on 08/12/2022  Component Date Value Ref Range Status   Sodium 08/12/2022 140  135 - 145 mmol/L Final   Potassium 08/12/2022 3.4 (L)  3.5 - 5.1 mmol/L Final   Chloride 08/12/2022 102  98 -  111 mmol/L Final   CO2 08/12/2022 24  22 - 32 mmol/L Final   Glucose, Bld 08/12/2022 160 (H)  70 - 99 mg/dL Final   Glucose reference range applies only to samples taken after fasting for at least 8 hours.   BUN 08/12/2022 19  6 - 20 mg/dL Final   Creatinine, Ser 08/12/2022 0.96  0.44 - 1.00 mg/dL Final  Calcium 08/12/2022 8.5 (L)  8.9 - 10.3 mg/dL Final   Total Protein 56/21/3086 6.4 (L)  6.5 - 8.1 g/dL Final   Albumin 57/84/6962 3.6  3.5 - 5.0 g/dL Final   AST 95/28/4132 78 (H)  15 - 41 U/L Final   ALT 08/12/2022 57 (H)  0 - 44 U/L Final   Alkaline Phosphatase 08/12/2022 68  38 - 126 U/L Final   Total Bilirubin 08/12/2022 0.5  0.3 - 1.2 mg/dL Final   GFR, Estimated 08/12/2022 >60  >60 mL/min Final   Comment: (NOTE) Calculated using the CKD-EPI Creatinine Equation (2021)    Anion gap 08/12/2022 14  5 - 15 Final   Performed at [facility], [address], [place], [place] [address]   Magnesium 08/12/2022 2.1  1.7 - 2.4 mg/dL Final   Performed at [facility], [address], [place], [place] [address]  Admission on 04/20/2022, Discharged on 04/21/2022  Component Date Value Ref Range Status   WBC 04/20/2022 9.3  4.0 - 10.5 K/uL Final   RBC 04/20/2022 4.20  3.87 - 5.11 MIL/uL Final   Hemoglobin 04/20/2022 13.4  12.0 - 15.0 g/dL Final   HCT 66/44/0347 38.3  36.0 - 46.0 % Final   MCV 04/20/2022 91.2  80.0 - 100.0 fL Final   MCH 04/20/2022 31.9  26.0 - 34.0 pg Final   MCHC 04/20/2022 35.0  30.0 - 36.0 g/dL Final   RDW 42/59/5638 11.6  11.5 - 15.5 % Final   Platelets 04/20/2022 254  150 - 400 K/uL Final   nRBC 04/20/2022 0.0  0.0 - 0.2 % Final   Neutrophils Relative % 04/20/2022 73  % Final   Neutro Abs 04/20/2022 6.8  1.7 - 7.7 K/uL Final   Lymphocytes Relative 04/20/2022 21  % Final   Lymphs Abs 04/20/2022 1.9  0.7 - 4.0 K/uL Final   Monocytes Relative 04/20/2022 5  % Final   Monocytes Absolute 04/20/2022 0.5  0.1 - 1.0 K/uL Final   Eosinophils Relative 04/20/2022 0   % Final   Eosinophils Absolute 04/20/2022 0.0  0.0 - 0.5 K/uL Final   Basophils Relative 04/20/2022 1  % Final   Basophils Absolute 04/20/2022 0.1  0.0 - 0.1 K/uL Final   Immature Granulocytes 04/20/2022 0  % Final   Abs Immature Granulocytes 04/20/2022 0.02  0.00 - 0.07 K/uL Final   Performed at [facility], [address] [place], [place] [address]   Sodium 04/20/2022 139  135 - 145 mmol/L Final   Potassium 04/20/2022 3.5  3.5 - 5.1 mmol/L Final   Chloride 04/20/2022 107  98 - 111 mmol/L Final   CO2 04/20/2022 25  22 - 32 mmol/L Final   Glucose, Bld 04/20/2022 83  70 - 99 mg/dL Final   Glucose reference range applies only to samples taken after fasting for at least 8 hours.   BUN 04/20/2022 5 (L)  6 - 20 mg/dL Final   Creatinine, Ser 04/20/2022 0.80  0.44 - 1.00 mg/dL Final   Calcium 32/95/1884 8.9  8.9 - 10.3 mg/dL Final   Total Protein 16/60/6301 7.5  6.5 - 8.1 g/dL Final   Albumin 60/10/9321 4.5  3.5 - 5.0 g/dL Final   AST 55/73/2202 15  15 - 41 U/L Final   ALT 04/20/2022 12  0 - 44 U/L Final   Alkaline Phosphatase 04/20/2022 35 (L)  38 - 126 U/L Final   Total Bilirubin 04/20/2022 0.6  0.3 - 1.2  mg/dL Final   GFR, Estimated 04/20/2022 >60  >60 mL/min Final   Comment: (NOTE) Calculated using the CKD-EPI Creatinine Equation (2021)    Anion gap 04/20/2022 7  5 - 15 Final   Performed at [facility], [address] [place], [place] [address]   I-stat hCG, quantitative 04/20/2022 <5.0  <5 mIU/mL Final   Comment 3 04/20/2022          Final   Comment:   GEST. AGE      CONC.  (mIU/mL)   <=1 WEEK        5 - 50     2 WEEKS       50 - 500     3 WEEKS       100 - 10,000     4 WEEKS     1,000 - 30,000        FEMALE AND NON-PREGNANT FEMALE:     LESS THAN 5 mIU/mL    Opiates 04/20/2022 NONE DETECTED  NONE DETECTED Final   Cocaine 04/20/2022 NONE DETECTED  NONE DETECTED Final   Benzodiazepines 04/20/2022 NONE DETECTED  NONE DETECTED Final    Amphetamines 04/20/2022 NONE DETECTED  NONE DETECTED Final   Tetrahydrocannabinol 04/20/2022 POSITIVE (A)  NONE DETECTED Final   Barbiturates 04/20/2022 NONE DETECTED  NONE DETECTED Final   Comment: (NOTE) DRUG SCREEN FOR MEDICAL PURPOSES ONLY.  IF CONFIRMATION IS NEEDED FOR ANY PURPOSE, NOTIFY LAB WITHIN 5 DAYS.  LOWEST DETECTABLE LIMITS FOR URINE DRUG SCREEN Drug Class                     Cutoff (ng/mL) Amphetamine and metabolites    1000 Barbiturate and metabolites    200 Benzodiazepine                 200 Opiates and metabolites        300 Cocaine and metabolites        300 THC                            50 Performed at [facility], [address], [place], [place] [address]    Alcohol, Ethyl (B) 04/20/2022 <10  <10 mg/dL Final   Comment: (NOTE) Lowest detectable limit for serum alcohol is 10 mg/dL.  For medical purposes only. Performed at [facility], [address], [place], [place] [address]    Salicylate Lvl 04/20/2022 <7.0 (L)  7.0 - 30.0 mg/dL Final   Performed at [facility], [address] [place], [place] [address]   Acetaminophen (Tylenol), Serum 04/20/2022 <10 (L)  10 - 30 ug/mL Final   Comment: (NOTE) Therapeutic concentrations vary significantly. A range of 10-30 ug/mL  may be an effective concentration for many patients. However, some  are best treated at concentrations outside of this range. Acetaminophen concentrations >150 ug/mL at 4 hours after ingestion  and >50 ug/mL at 12 hours after ingestion are often associated with  toxic reactions.  Performed at [facility], [address], [place], [place] [address]    SARS Coronavirus 2 by RT PCR 04/20/2022 NEGATIVE  NEGATIVE Final   Comment: (NOTE) SARS-CoV-2 target nucleic acids are NOT DETECTED.  The SARS-CoV-2 RNA is generally detectable in upper respiratory specimens during the acute phase of infection. The  lowest concentration of SARS-CoV-2 viral copies this assay can detect is 138 copies/mL. A negative result does  not preclude SARS-Cov-2 infection and should not be used as the sole basis for treatment or other patient management decisions. A negative result may occur with  improper specimen collection/handling, submission of specimen other than nasopharyngeal swab, presence of viral mutation(s) within the areas targeted by this assay, and inadequate number of viral copies(<138 copies/mL). A negative result must be combined with clinical observations, patient history, and epidemiological information. The expected result is Negative.  Fact Sheet for Patients:  BloggerCourse.com  Fact Sheet for Healthcare Providers:  SeriousBroker.it  This test is no                          t yet approved or cleared by the [place] FDA and  has been authorized for detection and/or diagnosis of SARS-CoV-2 by FDA under an Emergency Use Authorization (EUA). This EUA will remain  in effect (meaning this test can be used) for the duration of the COVID-19 declaration under Section 564(b)(1) of the Act, 21 U.S.C.section 360bbb-3(b)(1), unless the authorization is terminated  or revoked sooner.       Influenza A by PCR 04/20/2022 NEGATIVE  NEGATIVE Final   Influenza B by PCR 04/20/2022 NEGATIVE  NEGATIVE Final   Comment: (NOTE) The Xpert Xpress SARS-CoV-2/FLU/RSV plus assay is intended as an aid in the diagnosis of influenza from Nasopharyngeal swab specimens and should not be used as a sole basis for treatment. Nasal washings and aspirates are unacceptable for Xpert Xpress SARS-CoV-2/FLU/RSV testing.  Fact Sheet for Patients: BloggerCourse.com  Fact Sheet for Healthcare Providers: SeriousBroker.it  This test is not yet approved or cleared by the [place] FDA and has been authorized for  detection and/or diagnosis of SARS-CoV-2 by FDA under an Emergency Use Authorization (EUA). This EUA will remain in effect (meaning this test can be used) for the duration of the COVID-19 declaration under Section 564(b)(1) of the Act, 21 U.S.C. section 360bbb-3(b)(1), unless the authorization is terminated or revoked.     Resp Syncytial Virus by PCR 04/20/2022 NEGATIVE  NEGATIVE Final   Comment: (NOTE) Fact Sheet for Patients: BloggerCourse.com  Fact Sheet for Healthcare Providers: SeriousBroker.it  This test is not yet approved or cleared by the [place] FDA and has been authorized for detection and/or diagnosis of SARS-CoV-2 by FDA under an Emergency Use Authorization (EUA). This EUA will remain in effect (meaning this test can be used) for the duration of the COVID-19 declaration under Section 564(b)(1) of the Act, 21 U.S.C. section 360bbb-3(b)(1), unless the authorization is terminated or revoked.  Performed at [facility], [address], [place], [place] [address]    TSH 04/20/2022 1.707  0.350 - 4.500 uIU/mL Final   Comment: Performed by a 3rd Generation assay with a functional sensitivity of <=0.01 uIU/mL. Performed at [facility], [address], [place], [place] [address]    Cholesterol 04/20/2022 180  0 - 200 mg/dL Final   Triglycerides 09/81/1914 88  <150 mg/dL Final   HDL 78/29/5621 42  >40 mg/dL Final   Total CHOL/HDL Ratio 04/20/2022 4.3  RATIO Final   VLDL 04/20/2022 18  0 - 40 mg/dL Final   LDL Cholesterol 04/20/2022 120 (H)  0 - 99 mg/dL Final   Comment:        Total Cholesterol/HDL:CHD Risk Coronary Heart Disease Risk Table                     Men   Women  1/2 Average Risk   3.4   3.3  Average Risk       5.0   4.4  2 X Average Risk   9.6   7.1  3 X Average Risk  23.4   11.0        Use the calculated Patient Ratio above and the CHD Risk Table to determine  the patient's CHD Risk.        ATP III CLASSIFICATION (LDL):  <100     mg/dL   Optimal  161-096  mg/dL   Near or Above                    Optimal  130-159  mg/dL   Borderline  045-409  mg/dL   High  >811     mg/dL   Very High Performed at [facility], [address] [place], [place] [address]    Hgb A1c MFr Bld 04/20/2022 4.7 (L)  4.8 - 5.6 % Final   Comment: (NOTE) Pre diabetes:          5.7%-6.4%  Diabetes:              >6.4%  Glycemic control for   <7.0% adults with diabetes    Mean Plasma Glucose 04/20/2022 88.19  mg/dL Final   Performed at [facility], [address] [place], [place] [address]    Blood Alcohol level:  Lab Results  Component Value Date   [facility] <10 08/12/2022   ETH <10 04/20/2022    Metabolic Disorder Labs: Lab Results  Component Value Date   HGBA1C 4.7 (L) 04/20/2022   MPG 88.19 04/20/2022   No results found for: "PROLACTIN" Lab Results  Component Value Date   CHOL 180 04/20/2022   TRIG 88 04/20/2022   HDL 42 04/20/2022   CHOLHDL 4.3 04/20/2022   VLDL 18 04/20/2022   LDLCALC 120 (H) 04/20/2022    Therapeutic Lab Levels: No results found for: "LITHIUM" No results found for: "VALPROATE" No results found for: "CBMZ"  Physical Findings   AUDIT    Flowsheet Row [facility]  Alcohol Use Disorder Identification Test Final Score (AUDIT) 1      Flowsheet Row ED from 08/12/2022 in [facility] Most recent reading at 08/13/2022  1:21 AM ED from 08/12/2022 in [facility] at [facility] Most recent reading at 08/12/2022  5:00 PM ED from 08/12/2022 in [facility] Most recent reading at 08/12/2022  1:20 PM  C-SSRS RISK CATEGORY No Risk No Risk No Risk        Musculoskeletal  Strength & Muscle Tone: within normal limits Gait & Station: normal Patient leans:  N/A  Psychiatric Specialty Exam  Presentation  General Appearance:  Casual  Eye Contact: Fair  Speech: Clear and Coherent  Speech Volume: Normal  Handedness: Right   Mood and Affect  Mood: Labile; Irritable  Affect: Labile   Thought Process  Thought Processes: Disorganized  Descriptions of Associations:Tangential  Orientation:Full (Time, Place and Person)  Thought Content:Tangential; Paranoid Ideation  Diagnosis of Schizophrenia or Schizoaffective disorder in past: No  Duration of Psychotic Symptoms: Less than six months   Hallucinations:Hallucinations: Other (comment) (does not answer directly, reports "talking to myself and praying out loud")  Ideas of Reference:Paranoia  Suicidal Thoughts:Suicidal Thoughts: No  Homicidal Thoughts:Homicidal Thoughts: No   Sensorium  Memory: Immediate Fair  Judgment: Impaired  Insight: Lacking   Executive Functions  Concentration: Poor  Attention Span: Poor  Recall: [employer] of Knowledge: Fair  Language: Fair   Psychomotor Activity  Psychomotor Activity: Psychomotor Activity: Normal   Assets  Assets: Manufacturing systems engineer; Desire for Improvement   Sleep  Sleep: Sleep: Poor   Nutritional Assessment (For OBS and FBC admissions only) Has the patient had a weight loss or gain of 10 pounds or more in the last 3 months?: No Has the patient had a decrease in food intake/or appetite?: No Does the patient have dental problems?: No Does the patient have eating habits or behaviors that may be indicators of an eating disorder including binging or inducing vomiting?: No Has the patient recently lost weight without trying?: 0 Has the patient been eating poorly because of a decreased appetite?: 0 Malnutrition Screening Tool Score: 0    Physical Exam  Physical Exam Vitals and nursing note reviewed.  Constitutional:      Appearance: Normal appearance. She is well-developed.  HENT:     Head:  Normocephalic and atraumatic.     Nose: Nose normal.  Cardiovascular:     Rate and Rhythm: Normal rate.  Pulmonary:     Effort: Pulmonary effort is normal.  Musculoskeletal:        General: Normal range of motion.     Cervical back: Normal range of motion.  Skin:    General: Skin is warm and dry.  Neurological:     Mental Status: She is alert and oriented to person, place, and time.  Psychiatric:        Attention and Perception: Attention normal.        Mood and Affect: Mood is anxious. Affect is labile.        Speech: Speech is tangential.        Behavior: Behavior is uncooperative.    Review of Systems  Constitutional: Negative.   HENT: Negative.    Eyes: Negative.   Respiratory: Negative.    Cardiovascular: Negative.   Gastrointestinal: Negative.   Genitourinary: Negative.   Musculoskeletal: Negative.   Skin: Negative.   Neurological: Negative.   Psychiatric/Behavioral:  The patient is nervous/anxious and has insomnia.    Blood pressure 124/74, pulse 67, temperature 97.6 F (36.4 C), temperature source Oral, resp. rate 18, SpO2 100%. There is no height or weight on file to calculate BMI.  Treatment Plan Summary: Patient IVC.  Inpatient psychiatric treatment recommended.  Current medications: -Acetaminophen 650 mg every 6 as needed/mild pain -Maalox 30 mL oral every 4 as needed/digestion -Hydroxyzine 25 mg 3 times daily as needed/anxiety -Magnesium hydroxide 30 mL daily as needed/mild constipation -Trazodone 50 mg nightly as needed/sleep  -Olanzapine 5 mg nightly/mood -Ziprasidone 20 mg IM every 12 hours as needed/agitation   Daily contact with patient to assess and evaluate symptoms and progress in treatment  Lenard Lance, FNP 08/13/2022 9:39 AM

## 2022-08-13 NOTE — ED Notes (Signed)
Patient resting quietly in chair.  Respirations equal and unlabored, skin warm and dry, NAD. Routine safety checks conducted according to facility protocol. Will continue to monitor for safety.

## 2022-08-13 NOTE — ED Notes (Addendum)
Patient said she would take medication. After patient was scanned and medication scanned and placed in medication cup patient refused to take it. Encouragement and support provided and safety maintained.

## 2022-08-13 NOTE — ED Notes (Signed)
Patient is reaching thru the glass at the front desk and grabbed the phone and would not let go, I had to pull it from her, on her way back from the bathroom, she was then led back to the flex area where she proceeded to do it again and put her arm thru the glass in the whole used to pass food and meds, and tried to grab whatever was within her grasp. Again she was told not to do that, she just said we were being to aggressive about it. Informed her we were not aggressive she is not supposed to put her arm thru and touch anything on our side of the glass. The patient then sat down and continued to stare at me.

## 2022-08-13 NOTE — ED Notes (Signed)
Patient continues to refuse meals and refuse to take any medication.

## 2022-08-13 NOTE — ED Notes (Signed)
Pt awake, alert & responsive, no distress noted.  Pt remains quiet and irritable, labile. Guarded.  Pt remains IVC.  Monitoring for safety.

## 2022-08-13 NOTE — ED Notes (Signed)
Pt remains sitting up at bedside, guarded, withdrawn, irritable and labile.  No distress noted.  Monitoring for safety.  Pt is IVC.

## 2022-08-13 NOTE — ED Notes (Signed)
Patient is currently sitting in chair quietly, no distress noted, will continue to monitor patient for safety.

## 2022-08-13 NOTE — Progress Notes (Signed)
LCSW Progress Note  [id number]   Christy [name]  08/13/2022  2:12 AM    Inpatient Behavioral Health Placement  Pt meets inpatient criteria per Tina Allen,FNP. There are no available beds within [facility] per [facility] Edythe Clarity, RN. Referral was sent to the following facilities;   Destination  Service Provider Address Phone [facility]  [address] [place], [place] [address] [phone]  [facility]  [address] [place] [address] [phone]  [facility]  [address] [place] [address] [phone]  [facility]  [address] [place], [place] [address] [phone]  [facility]  [address] [place] [address] [phone]  [facility]  [address], [place] [address] [phone]  [facility]  [address] [place] [address] [phone]  [facility]  [address] [place] [address] [phone]  [facility]  [address], [place] [address] [phone]  [facility]  [address] [place] [address] [phone]  [facility]  [address], [place] [address] [phone]  [facility]  [address] [place] [address] [phone]  [facility]  [address], [place] [address] [phone]  [facility]  [address], [place] [address] [phone]  [facility]  [address] [place] [address] [phone]  [facility]  [address], [place] [address] [phone]  [facility]  [address], [place] [address] [phone]  [facility]  [address] [place] [address] [phone]  [facility]  [address] [place] [address] [phone]  [facility] [address] [place]  [address] [place] [address] [phone]  [facility]  [address] [place], [place] [address] [phone]  [facility] [address]  [address] [place] [address] [phone]  [facility]  [address], [place] [address] [phone]    Situation ongoing,  CSW will follow up.    Maryjean Ka, MSW, LCSWA 08/13/2022 2:12 AM

## 2022-08-13 NOTE — Progress Notes (Signed)
CSW received a phone call from [facility] who informed pt is under review. CSW provided phone number to speak with RN to [facility] Intake. CSW informed nursing.  Care Team notified: Celene Skeen, [facility] Edythe Clarity, RN   Maryjean Ka, MSW, [facility] 08/13/2022 2:48 AM

## 2022-08-14 MED ORDER — ENSURE MAX PROTEIN PO LIQD: 11.0000 [oz_av] | Freq: Two times a day (BID) | ORAL | Status: DC

## 2022-08-14 MED ORDER — OLANZAPINE 5 MG PO TABS
5.0000 mg | ORAL_TABLET | Freq: Two times a day (BID) | ORAL | Status: DC
  Administered 2022-08-16 (×2): 5 mg via ORAL
  Filled 2022-08-14 (×4): qty 1

## 2022-08-14 MED ORDER — OLANZAPINE 10 MG IM SOLR
10.0000 mg | INTRAMUSCULAR | Status: AC
  Administered 2022-08-14: 10 mg via INTRAMUSCULAR
  Filled 2022-08-14: qty 10

## 2022-08-14 MED ORDER — ENSURE ENLIVE PO LIQD
237.0000 mL | Freq: Two times a day (BID) | ORAL | Status: DC
  Administered 2022-08-14 – 2022-08-16 (×2): 237 mL via ORAL
  Filled 2022-08-14 (×6): qty 237

## 2022-08-14 NOTE — Progress Notes (Signed)
Patient has been denied by [facility] due to no appropriate beds available. Patient meets BH inpatient criteria per Doran Heater, NP. Patient has been faxed out to the following facilities:   [facility]  [address] [place], [place] [address] [phone]  [facility]  [address] [place] [address] [phone]  [facility]  [address] [place] [address] [phone]  [facility]  [address] [place], [place] [address] [phone]  [facility]  [address] [place] [address] [phone]  [facility]  [address], [place] [address] [phone]  [facility]  [address] [place] [address] [phone]  [facility]  [address] [place] [address] [phone]  [facility]  [address] [place] [address] [phone]  [facility]  [address], [place] [address] [phone]  [facility]  [address], [place] [address] [phone]  [facility]  [address] [place] [address] [phone]  [facility]  [address] [place] [address] [phone]  [facility]  [address], [place] [address] [phone]  [facility]  [address] [place] [address] [phone]  [facility]  [address] [place] [address] [phone]  [facility]  [address], [place] [address] [phone]  [facility]   [address] [place] [address] [phone]  [facility]  [address], [place] [address] [phone]  [facility] [address] [place]  [address], [place] [address] [phone]  [facility]  [address] [place], [place] [address] [phone]  [facility] [address]  [address] [place] [address] [phone]  [facility]  [address] [place] [address] [phone]  [facility]  [address], [place] [address] [phone]  [facility]  [address] [place] [address] [phone]  [facility]  [phone] [address] [place] [address] [phone]  [facility]  [address] [place] [address] [phone]  [facility]  [address] [place] [address] [phone]  [facility]  [address] [place] [address] [phone]  [facility]  [address], [place] [address] [phone]   Damita Dunnings, MSW, LCSW-A  11:24 AM 08/14/2022

## 2022-08-14 NOTE — ED Notes (Signed)
Pt refused vitals 

## 2022-08-14 NOTE — Progress Notes (Signed)
Patient is under review at [facility]. CSW sent additional information for continued review. CSW informed [facility] staff that updated vitals, COVID, and labs.    Damita Dunnings, MSW, LCSW-A  6:05 PM 08/14/2022

## 2022-08-14 NOTE — ED Notes (Signed)
Pt refused meds, remains irritable, sitting up in chair. No distress noted.  Monitoring for safety.  Pt refusing all foods and fluids. Has not urinated yet on this shift.

## 2022-08-14 NOTE — ED Notes (Signed)
Pt under IVC, remains irritable, angry, guarded, resistant to care.  Refusing foods, fluids and meds when offered.  NP Ene [place] notified.

## 2022-08-14 NOTE — ED Notes (Signed)
Patient continues to refuse vital signs, covid swab and refuse to give urine sample. Vernard Gambles, NP  made aware. Encouragement and support provided and safety maintain.

## 2022-08-14 NOTE — ED Notes (Signed)
Patient resting quietly in bed with eyes closed. Respirations equal and unlabored, skin warm and dry, NAD. Routine safety checks conducted according to facility protocol. Will continue to monitor for safety.  

## 2022-08-14 NOTE — ED Provider Notes (Cosign Needed Addendum)
Behavioral Health Progress Note  Date and Time: 08/14/2022 1:55 PM Name: Christy [name] MRN:  [id number]  Subjective:  "I came here when I called EMS"   Diagnosis:  Final diagnoses:  Psychosis, unspecified psychosis type (HCC)  Marijuana use  Schizophreniform disorder (HCC)    Total Time spent with patient: 30 minutes   Christy [name] is a 27 year old female with a history of schizoaffective disorder and depression initially presented to [facility] on 08/12/2022 via police escort, under IVC petition, petitioned by her mother due to concerns that patient is mentally decompensating. Per admission note, patient initially presented to [facility] on 08/12/2022 with active delusional thoughts, tangential speech and paranoia. Patient has a history of prior psychiatric hospital admissions. 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 Patient was discharged with home medications: Olanzapine 10 mg , Trazodone 50 mg at bedtime, and hydroxyzine 25 mg, 3 times daily.  On evaluation today, patient is sitting upright in chair with her arms tucked inside her shirt. When asked if she was taking medications at home, patient stated, " I don't like pills."  When asked if she prefers an injectable medications, patient looked at this writer mobile device which was secured to clip board, and stated "stop recording me". The device was shown to patient to reassure her that she was not being recorded. Patient has refused medication, food, drink, and vital since she was medically cleared and brought back here form the ED on 08/12/2022. She has snacks beside her bed which are open and cookies appear to have missing from package. The MT attempted to obtain vitals while this writer was present, however, patient became agitated and told MT "you all on top of me". The MT was only attempting to obtain the  patient's blood pressure. This writer attempted to get her blood pressure, pulse, and pulse ox, patient uncooperative unable to obtain a value with manual blood pressure. Patient is severely disorganized with tangential speech. She tells this Clinical research associate that   "I came here when I called EMS" when she was actually brought here by police after her mother called them.  Patient is laughing in appropriately and remains paranoid. She has continued to refuse medication today, but agreed to drink orange juice.  Patient continues to remain a risk for safety as she is actively psychotic with paranoia and disorganized thoughts. Patient continues to meet criteria for inpatient psychiatric treatment.  Past Psychiatric History: Psychosis, depression Past Medical History: Gestational diabetes Family History: None reported Family Psychiatric  History: None reported Social History: Resides in [place], marijuana use (+UDS) , unemployed  Additional Social History: N/A      Sleep: " patient slept intermittently overnight" Patient will not answer direct questions.  Appetite:  Poor  Current Medications:  Current Facility-Administered Medications  Medication Dose Route Frequency Provider Last Rate Last Admin   acetaminophen (TYLENOL) tablet 650 mg  650 mg Oral Q6H PRN Onuoha, Chinwendu V, NP       alum & mag hydroxide-simeth (MAALOX/MYLANTA) 200-200-20 MG/5ML suspension 30 mL  30 mL Oral Q4H PRN Onuoha, Chinwendu V, NP       hydrOXYzine (ATARAX) tablet 25 mg  25 mg Oral TID PRN Onuoha, Chinwendu V, NP       ziprasidone (GEODON) injection 20 mg  20 mg Intramuscular Q12H PRN Onuoha, Chinwendu V, NP  And   LORazepam (ATIVAN) tablet 1 mg  1 mg Oral PRN Onuoha, Chinwendu V, NP       magnesium hydroxide (MILK OF MAGNESIA) suspension 30 mL  30 mL Oral Daily PRN Onuoha, Chinwendu V, NP       OLANZapine (ZYPREXA) injection 5 mg  5 mg Intramuscular Once Onuoha, Chinwendu V, NP       OLANZapine (ZYPREXA) tablet 5 mg   5 mg Oral BID Bing Neighbors, NP       protein supplement (ENSURE MAX) liquid  11 oz Oral BID Bing Neighbors, NP       traZODone (DESYREL) tablet 50 mg  50 mg Oral QHS PRN Onuoha, Chinwendu V, NP       No current outpatient medications on file.    Labs  Lab Results:  Admission on 08/12/2022, Discharged on 08/12/2022  Component Date Value Ref Range Status   WBC 08/12/2022 6.8  4.0 - 10.5 K/uL Final   RBC 08/12/2022 4.31  3.87 - 5.11 MIL/uL Final   Hemoglobin 08/12/2022 13.9  12.0 - 15.0 g/dL Final   HCT 10/93/2355 40.8  36.0 - 46.0 % Final   MCV 08/12/2022 94.7  80.0 - 100.0 fL Final   MCH 08/12/2022 32.3  26.0 - 34.0 pg Final   MCHC 08/12/2022 34.1  30.0 - 36.0 g/dL Final   RDW 73/22/0254 11.5  11.5 - 15.5 % Final   Platelets 08/12/2022 284  150 - 400 K/uL Final   nRBC 08/12/2022 0.0  0.0 - 0.2 % Final   Neutrophils Relative % 08/12/2022 75  % Final   Neutro Abs 08/12/2022 5.0  1.7 - 7.7 K/uL Final   Lymphocytes Relative 08/12/2022 22  % Final   Lymphs Abs 08/12/2022 1.5  0.7 - 4.0 K/uL Final   Monocytes Relative 08/12/2022 3  % Final   Monocytes Absolute 08/12/2022 0.2  0.1 - 1.0 K/uL Final   Eosinophils Relative 08/12/2022 0  % Final   Eosinophils Absolute 08/12/2022 0.0  0.0 - 0.5 K/uL Final   Basophils Relative 08/12/2022 0  % Final   Basophils Absolute 08/12/2022 0.0  0.0 - 0.1 K/uL Final   Immature Granulocytes 08/12/2022 0  % Final   Abs Immature Granulocytes 08/12/2022 0.01  0.00 - 0.07 K/uL Final   Performed at [facility], [address], [place], [place] [address]   Sodium 08/12/2022 135  135 - 145 mmol/L Final   Potassium 08/12/2022 3.8  3.5 - 5.1 mmol/L Final   Chloride 08/12/2022 105  98 - 111 mmol/L Final   CO2 08/12/2022 21 (L)  22 - 32 mmol/L Final   Glucose, Bld 08/12/2022 80  70 - 99 mg/dL Final   Glucose reference range applies only to samples taken after fasting for at least 8 hours.   BUN 08/12/2022 12  6 - 20 mg/dL Final   Creatinine, Ser  08/12/2022 0.76  0.44 - 1.00 mg/dL Final   Calcium 37/62/8315 9.0  8.9 - 10.3 mg/dL Final   Total Protein 17/61/6073 7.2  6.5 - 8.1 g/dL Final   Albumin 71/06/2692 4.3  3.5 - 5.0 g/dL Final   AST 85/46/2703 15  15 - 41 U/L Final   ALT 08/12/2022 13  0 - 44 U/L Final   Alkaline Phosphatase 08/12/2022 39  38 - 126 U/L Final   Total Bilirubin 08/12/2022 0.9  0.3 - 1.2 mg/dL Final   GFR, Estimated 08/12/2022 >60  >60 mL/min Final   Comment: (  NOTE) Calculated using the CKD-EPI Creatinine Equation (2021)    Anion gap 08/12/2022 9  5 - 15 Final   Performed at [facility], [address], [place], [place] [address]   Alcohol, Ethyl (B) 08/12/2022 <10  <10 mg/dL Final   Comment: (NOTE) Lowest detectable limit for serum alcohol is 10 mg/dL.  For medical purposes only. Performed at [facility], [address], [place], [place] [address]   Admission on 08/12/2022, Discharged on 08/12/2022  Component Date Value Ref Range Status   Sodium 08/12/2022 140  135 - 145 mmol/L Final   Potassium 08/12/2022 3.4 (L)  3.5 - 5.1 mmol/L Final   Chloride 08/12/2022 102  98 - 111 mmol/L Final   CO2 08/12/2022 24  22 - 32 mmol/L Final   Glucose, Bld 08/12/2022 160 (H)  70 - 99 mg/dL Final   Glucose reference range applies only to samples taken after fasting for at least 8 hours.   BUN 08/12/2022 19  6 - 20 mg/dL Final   Creatinine, Ser 08/12/2022 0.96  0.44 - 1.00 mg/dL Final   Calcium 09/81/1914 8.5 (L)  8.9 - 10.3 mg/dL Final   Total Protein 78/29/5621 6.4 (L)  6.5 - 8.1 g/dL Final   Albumin 30/86/5784 3.6  3.5 - 5.0 g/dL Final   AST 69/62/9528 78 (H)  15 - 41 U/L Final   ALT 08/12/2022 57 (H)  0 - 44 U/L Final   Alkaline Phosphatase 08/12/2022 68  38 - 126 U/L Final   Total Bilirubin 08/12/2022 0.5  0.3 - 1.2 mg/dL Final   GFR, Estimated 08/12/2022 >60  >60 mL/min Final   Comment: (NOTE) Calculated using the CKD-EPI Creatinine Equation (2021)    Anion gap 08/12/2022 14  5 - 15 Final    Performed at [facility] [place] [facility], [address], [place], [place] [address]   Magnesium 08/12/2022 2.1  1.7 - 2.4 mg/dL Final   Performed at [facility], [address] [place], [place] [address]  Admission on 04/20/2022, Discharged on 04/21/2022  Component Date Value Ref Range Status   WBC 04/20/2022 9.3  4.0 - 10.5 K/uL Final   RBC 04/20/2022 4.20  3.87 - 5.11 MIL/uL Final   Hemoglobin 04/20/2022 13.4  12.0 - 15.0 g/dL Final   HCT 72/53/6644 38.3  36.0 - 46.0 % Final   MCV 04/20/2022 91.2  80.0 - 100.0 fL Final   MCH 04/20/2022 31.9  26.0 - 34.0 pg Final   MCHC 04/20/2022 35.0  30.0 - 36.0 g/dL Final   RDW 03/47/4259 11.6  11.5 - 15.5 % Final   Platelets 04/20/2022 254  150 - 400 K/uL Final   nRBC 04/20/2022 0.0  0.0 - 0.2 % Final   Neutrophils Relative % 04/20/2022 73  % Final   Neutro Abs 04/20/2022 6.8  1.7 - 7.7 K/uL Final   Lymphocytes Relative 04/20/2022 21  % Final   Lymphs Abs 04/20/2022 1.9  0.7 - 4.0 K/uL Final   Monocytes Relative 04/20/2022 5  % Final   Monocytes Absolute 04/20/2022 0.5  0.1 - 1.0 K/uL Final   Eosinophils Relative 04/20/2022 0  % Final   Eosinophils Absolute 04/20/2022 0.0  0.0 - 0.5 K/uL Final   Basophils Relative 04/20/2022 1  % Final   Basophils Absolute 04/20/2022 0.1  0.0 - 0.1 K/uL Final   Immature Granulocytes 04/20/2022 0  % Final   Abs Immature Granulocytes 04/20/2022 0.02  0.00 - 0.07 K/uL  Final   Performed at [facility], [address] [place], [place] [address]   Sodium 04/20/2022 139  135 - 145 mmol/L Final   Potassium 04/20/2022 3.5  3.5 - 5.1 mmol/L Final   Chloride 04/20/2022 107  98 - 111 mmol/L Final   CO2 04/20/2022 25  22 - 32 mmol/L Final   Glucose, Bld 04/20/2022 83  70 - 99 mg/dL Final   Glucose reference range applies only to samples taken after fasting for at least 8 hours.   BUN 04/20/2022 5 (L)  6 - 20 mg/dL Final   Creatinine, Ser 04/20/2022 0.80  0.44 - 1.00 mg/dL Final   Calcium 60/45/4098  8.9  8.9 - 10.3 mg/dL Final   Total Protein 11/91/4782 7.5  6.5 - 8.1 g/dL Final   Albumin 95/62/1308 4.5  3.5 - 5.0 g/dL Final   AST 65/78/4696 15  15 - 41 U/L Final   ALT 04/20/2022 12  0 - 44 U/L Final   Alkaline Phosphatase 04/20/2022 35 (L)  38 - 126 U/L Final   Total Bilirubin 04/20/2022 0.6  0.3 - 1.2 mg/dL Final   GFR, Estimated 04/20/2022 >60  >60 mL/min Final   Comment: (NOTE) Calculated using the CKD-EPI Creatinine Equation (2021)    Anion gap 04/20/2022 7  5 - 15 Final   Performed at [facility], [address] [place], [place] [address]   I-stat hCG, quantitative 04/20/2022 <5.0  <5 mIU/mL Final   Comment 3 04/20/2022          Final   Comment:   GEST. AGE      CONC.  (mIU/mL)   <=1 WEEK        5 - 50     2 WEEKS       50 - 500     3 WEEKS       100 - 10,000     4 WEEKS     1,000 - 30,000        FEMALE AND NON-PREGNANT FEMALE:     LESS THAN 5 mIU/mL    Opiates 04/20/2022 NONE DETECTED  NONE DETECTED Final   Cocaine 04/20/2022 NONE DETECTED  NONE DETECTED Final   Benzodiazepines 04/20/2022 NONE DETECTED  NONE DETECTED Final   Amphetamines 04/20/2022 NONE DETECTED  NONE DETECTED Final   Tetrahydrocannabinol 04/20/2022 POSITIVE (A)  NONE DETECTED Final   Barbiturates 04/20/2022 NONE DETECTED  NONE DETECTED Final   Comment: (NOTE) DRUG SCREEN FOR MEDICAL PURPOSES ONLY.  IF CONFIRMATION IS NEEDED FOR ANY PURPOSE, NOTIFY LAB WITHIN 5 DAYS.  LOWEST DETECTABLE LIMITS FOR URINE DRUG SCREEN Drug Class                     Cutoff (ng/mL) Amphetamine and metabolites    1000 Barbiturate and metabolites    200 Benzodiazepine                 200 Opiates and metabolites        300 Cocaine and metabolites        300 THC                            50 Performed at [facility], [address] [place], [place] [address]    Alcohol, Ethyl (B) 04/20/2022 <10  <10 mg/dL Final   Comment: (NOTE) Lowest detectable limit for serum alcohol is 10  mg/dL.  For  medical purposes only. Performed at [facility], [address] [place], [place] [address]    Salicylate Lvl 04/20/2022 <7.0 (L)  7.0 - 30.0 mg/dL Final   Performed at [facility], [address] [place], [place] [address]   Acetaminophen (Tylenol), Serum 04/20/2022 <10 (L)  10 - 30 ug/mL Final   Comment: (NOTE) Therapeutic concentrations vary significantly. A range of 10-30 ug/mL  may be an effective concentration for many patients. However, some  are best treated at concentrations outside of this range. Acetaminophen concentrations >150 ug/mL at 4 hours after ingestion  and >50 ug/mL at 12 hours after ingestion are often associated with  toxic reactions.  Performed at [facility], [address] [place], [place] [address]    SARS Coronavirus 2 by RT PCR 04/20/2022 NEGATIVE  NEGATIVE Final   Comment: (NOTE) SARS-CoV-2 target nucleic acids are NOT DETECTED.  The SARS-CoV-2 RNA is generally detectable in upper respiratory specimens during the acute phase of infection. The lowest concentration of SARS-CoV-2 viral copies this assay can detect is 138 copies/mL. A negative result does not preclude SARS-Cov-2 infection and should not be used as the sole basis for treatment or other patient management decisions. A negative result may occur with  improper specimen collection/handling, submission of specimen other than nasopharyngeal swab, presence of viral mutation(s) within the areas targeted by this assay, and inadequate number of viral copies(<138 copies/mL). A negative result must be combined with clinical observations, patient history, and epidemiological information. The expected result is Negative.  Fact Sheet for Patients:  BloggerCourse.com  Fact Sheet for Healthcare Providers:  SeriousBroker.it  This test is no                          t yet  approved or cleared by the [place] FDA and  has been authorized for detection and/or diagnosis of SARS-CoV-2 by FDA under an Emergency Use Authorization (EUA). This EUA will remain  in effect (meaning this test can be used) for the duration of the COVID-19 declaration under Section 564(b)(1) of the Act, 21 U.S.C.section 360bbb-3(b)(1), unless the authorization is terminated  or revoked sooner.       Influenza A by PCR 04/20/2022 NEGATIVE  NEGATIVE Final   Influenza B by PCR 04/20/2022 NEGATIVE  NEGATIVE Final   Comment: (NOTE) The Xpert Xpress SARS-CoV-2/FLU/RSV plus assay is intended as an aid in the diagnosis of influenza from Nasopharyngeal swab specimens and should not be used as a sole basis for treatment. Nasal washings and aspirates are unacceptable for Xpert Xpress SARS-CoV-2/FLU/RSV testing.  Fact Sheet for Patients: BloggerCourse.com  Fact Sheet for Healthcare Providers: SeriousBroker.it  This test is not yet approved or cleared by the [place] FDA and has been authorized for detection and/or diagnosis of SARS-CoV-2 by FDA under an Emergency Use Authorization (EUA). This EUA will remain in effect (meaning this test can be used) for the duration of the COVID-19 declaration under Section 564(b)(1) of the Act, 21 U.S.C. section 360bbb-3(b)(1), unless the authorization is terminated or revoked.     Resp Syncytial Virus by PCR 04/20/2022 NEGATIVE  NEGATIVE Final   Comment: (NOTE) Fact Sheet for Patients: BloggerCourse.com  Fact Sheet for Healthcare Providers: SeriousBroker.it  This test is not yet approved or cleared by the [place] FDA and has been authorized for detection and/or diagnosis of SARS-CoV-2 by FDA under an Emergency Use Authorization (EUA). This EUA will remain in effect (  meaning this test can be used) for the duration of the COVID-19  declaration under Section 564(b)(1) of the Act, 21 U.S.C. section 360bbb-3(b)(1), unless the authorization is terminated or revoked.  Performed at [facility], [address], [place], [place] [address]    TSH 04/20/2022 1.707  0.350 - 4.500 uIU/mL Final   Comment: Performed by a 3rd Generation assay with a functional sensitivity of <=0.01 uIU/mL. Performed at [facility], [address], [place], [place] [address]    Cholesterol 04/20/2022 180  0 - 200 mg/dL Final   Triglycerides 25/95/6387 88  <150 mg/dL Final   HDL 56/43/3295 42  >40 mg/dL Final   Total CHOL/HDL Ratio 04/20/2022 4.3  RATIO Final   VLDL 04/20/2022 18  0 - 40 mg/dL Final   LDL Cholesterol 04/20/2022 120 (H)  0 - 99 mg/dL Final   Comment:        Total Cholesterol/HDL:CHD Risk Coronary Heart Disease Risk Table                     Men   Women  1/2 Average Risk   3.4   3.3  Average Risk       5.0   4.4  2 X Average Risk   9.6   7.1  3 X Average Risk  23.4   11.0        Use the calculated Patient Ratio above and the CHD Risk Table to determine the patient's CHD Risk.        ATP III CLASSIFICATION (LDL):  <100     mg/dL   Optimal  188-416  mg/dL   Near or Above                    Optimal  130-159  mg/dL   Borderline  606-301  mg/dL   High  >601     mg/dL   Very High Performed at [facility], [address] [place], [place] [address]    Hgb A1c MFr Bld 04/20/2022 4.7 (L)  4.8 - 5.6 % Final   Comment: (NOTE) Pre diabetes:          5.7%-6.4%  Diabetes:              >6.4%  Glycemic control for   <7.0% adults with diabetes    Mean Plasma Glucose 04/20/2022 88.19  mg/dL Final   Performed at [facility], [address] [place], [place] [address]    Blood Alcohol level:  Lab Results  Component Value Date   [facility] <10 08/12/2022   ETH <10 04/20/2022    Metabolic Disorder Labs: Lab Results  Component Value Date   HGBA1C 4.7 (L) 04/20/2022    MPG 88.19 04/20/2022   No results found for: "PROLACTIN" Lab Results  Component Value Date   CHOL 180 04/20/2022   TRIG 88 04/20/2022   HDL 42 04/20/2022   CHOLHDL 4.3 04/20/2022   VLDL 18 04/20/2022   LDLCALC 120 (H) 04/20/2022    Therapeutic Lab Levels: N/A  Physical Findings   AUDIT    Flowsheet Row [facility]  Alcohol Use Disorder Identification Test Final Score (AUDIT) 1      Flowsheet Row ED from 08/12/2022 in [facility] Most recent reading at 08/13/2022  1:21 AM ED from 08/12/2022 in [facility] Emergency Department at [facility] Most recent  reading at 08/12/2022  5:00 PM ED from 08/12/2022 in [facility] Most recent reading at 08/12/2022  1:20 PM  C-SSRS RISK CATEGORY No Risk No Risk No Risk        Musculoskeletal  Strength & Muscle Tone: within normal limits Gait & Station: normal Patient leans: N/A  Psychiatric Specialty Exam  Presentation  General Appearance:  Casual  Eye Contact: Fair  Speech: Clear and Coherent  Speech Volume: Normal  Handedness: Right   Mood and Affect  Mood: Labile; Irritable  Affect: Labile   Thought Process  Thought Processes: Disorganized  Descriptions of Associations:Tangential  Orientation:Full (Time, Place and Person)  Thought Content:Tangential; Paranoid Ideation  Diagnosis of Schizophrenia or Schizoaffective disorder in past: No  Duration of Psychotic Symptoms: Less than six months   Hallucinations:Hallucinations: Other (comment) (does not answer directly, reports "talking to myself and praying out loud")  Ideas of Reference:Paranoia  Suicidal Thoughts:Suicidal Thoughts: No  Homicidal Thoughts:Homicidal Thoughts: No   Sensorium  Memory: Immediate Fair  Judgment: Impaired  Insight: Lacking   Executive Functions  Concentration: Poor  Attention  Span: Poor  Recall: [employer] of Knowledge: Fair  Language: Fair   Psychomotor Activity  Psychomotor Activity: Psychomotor Activity: Normal   Assets  Assets: Communication Skills; Desire for Improvement   Sleep  Sleep: Sleep: Poor Patient unable to quantify sleep hours due to active psychosis  Physical Exam  Physical Exam Vitals reviewed.  Constitutional:      Appearance: Normal appearance.  HENT:     Head: Normocephalic and atraumatic.  Eyes:     Extraocular Movements: Extraocular movements intact.     Conjunctiva/sclera: Conjunctivae normal.     Pupils: Pupils are equal, round, and reactive to light.  Cardiovascular:     Rate and Rhythm: Normal rate and regular rhythm.  Pulmonary:     Effort: Pulmonary effort is normal.     Breath sounds: Normal breath sounds.  Skin:    General: Skin is warm and dry.  Neurological:     General: No focal deficit present.     Mental Status: She is alert.     Review of Systems  Psychiatric/Behavioral:  Positive for hallucinations and substance abuse. The patient is nervous/anxious.     Blood pressure 93/60, pulse 67, temperature 97.6 F (36.4 C), temperature source Oral, resp. rate 16, SpO2 100%. There is no height or weight on file to calculate BMI.  Treatment Plan Summary: Daily contact with patient to assess and evaluate symptoms and progress in treatment,  Medication management, and Plan: Patient meets criteria for inpatient psychiatric treatment Nursing aware to continue offer medications, hydration, and nourishment despite patients refusal. Ordered nutritional supplements to be left at bedside for patient to drink as needed.  Joaquin Courts, NP-C 08/14/2022 1:55 PM

## 2022-08-14 NOTE — ED Notes (Signed)
Patient drank Ensure and ate Oreo cookies.

## 2022-08-14 NOTE — ED Notes (Signed)
Patient refused to enter flex and was becoming agitated with staff. Delice Bison, RN was able to talk patient to enter back in flex. Patient was offered po Zyprexa but refused. Jerrilyn Cairo, NP ordered Zyprexa 10 IM to be given to patient. Patient refused to take medication and had to be be place in a manual hold to administer medication. Medication administer in right deltoid and hold ended at 1031 am.  Encouragement and support provided and safety maintain. Patient place 1:1 observation at this time.

## 2022-08-14 NOTE — ED Notes (Signed)
Patient refused Zyprexa 5 mg po. When writer was calling patient by her name patient said "don't call me that". Encouragement and support provided and safety maintain.

## 2022-08-14 NOTE — ED Notes (Signed)
Patient continues to be resistance to care. Patient is un willing to participate. She refused breakfast. Encouragement and support provided and safety maintain.

## 2022-08-15 LAB — URINALYSIS, COMPLETE (UACMP) WITH MICROSCOPIC
Bilirubin Urine: NEGATIVE
Glucose, UA: NEGATIVE mg/dL
Hgb urine dipstick: NEGATIVE
Ketones, ur: 5 mg/dL — AB
Nitrite: NEGATIVE
Protein, ur: NEGATIVE mg/dL
Specific Gravity, Urine: 1.03 (ref 1.005–1.030)
pH: 5 (ref 5.0–8.0)

## 2022-08-15 LAB — SARS CORONAVIRUS 2 BY RT PCR: SARS Coronavirus 2 by RT PCR: NEGATIVE

## 2022-08-15 NOTE — ED Notes (Signed)
Pt requested and ate 2 Protein Bars.  Pt remains guarded and resistant to care.  Monitoring for safety.

## 2022-08-15 NOTE — ED Notes (Signed)
Attempted COVID test, patient refused to allow [place] to.  Asked her to give lab specimen and urine specimen, patient became agitated and stated she was not going to allow [place] to "do nothing."

## 2022-08-15 NOTE — ED Notes (Signed)
Patient observed resting quietly, eyes closed, shirt pulled over her face. Respirations equal and unlabored. Will continue to monitor for safety.

## 2022-08-15 NOTE — ED Notes (Addendum)
COVID obtained. Attempted to obtain UDS but pt unable to urinate. Fluids and food offered but pt refused. Pt also refusing to communicate with this nurse.  Addendum: Advised by provider that pt did urinate some yesterday. Pt was taken to the restroom for a second attempt today and was able to provide approximately 50 mls of urine. UDS, UA, and pregnancy currently in process.

## 2022-08-15 NOTE — ED Notes (Signed)
Pt observed resting in the chair. Is calm and cooperative. Pt was offered food but declined. Denies any needs at this moment. Will continue to monitor for safety.

## 2022-08-15 NOTE — ED Notes (Signed)
Patient resting with no sxs of distress at this time - will continue to monitor for safety ?

## 2022-08-15 NOTE — Progress Notes (Signed)
CSW spoke with the Intake RN at [facility], who has requested additional information for continued review. CSW sent the requested information via fax and will continue to monitor the patient to secure recommended disposition.    Damita Dunnings, MSW, LCSW-A  10:34 AM 08/15/2022

## 2022-08-15 NOTE — ED Notes (Signed)
Patient currently sitting in chair. Pt is pleasant but suspicious. This nurse had the medication scanner in preparation of giving pt zyprexa when pt asked that I not use it. Attempted to convince pt to take zyprexa and Ensure and pt refused, stating, "I'm fine". Educated the pt on the importance of taking her med and pt replied again, "I'm fine." Attempted to assess pt for SI/HI and AVH, pt stated she will wait to discuss this with the therapist once she arrives at "the hospital". Provider made aware of pt's refusal. Ms. Annice Pih, MHT, has built rapport with pt and will attempt to convince pt to take her medication.

## 2022-08-15 NOTE — ED Notes (Signed)
Pt observed lying down with blanket covering her face. Respirations are equal and unlabored. We will continue to monitor for safety.

## 2022-08-15 NOTE — ED Provider Notes (Signed)
Behavioral Health Progress Note  Date and Time: 08/15/2022 10:50 PM Name: Christy [name] MRN:  [id number]  Subjective:  "I don't stink or nothing"  Diagnosis:  Final diagnoses:  Psychosis, unspecified psychosis type (HCC)  Marijuana use  Schizophreniform disorder (HCC)    Total Time spent with patient: 30 minutes Christy [name] is a 27 year old female with a history of schizoaffective disorder and depression initially presented to [facility] on 08/12/2022 via police escort, under IVC petition, petitioned by her mother due to concerns that patient is mentally decompensating. Per admission note, patient initially presented to [facility] on 08/12/2022 with active delusional thoughts, tangential speech and paranoia. Patient has a history of prior psychiatric hospital admissions. 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 Patient was discharged with home medications: Olanzapine 10 mg , Trazodone 50 mg at bedtime, and hydroxyzine 25 mg, 3 times daily.   On reevaluation today, patient is observed sitting upright in a chair on the unit and remaind  Upon arrival this morning, the mental health tech had built rapport with the patient and patient agreed to provide a urine specimen and to drink ensure and eat some snacks. Patient remains very paranoid , disheveled, and refuses to bathe. She also has continued to refuse medications, despite multiple attempts to encourage to medications. Patient is observed smiling inappropriately, although patient doesn't appear to be responding to internal stimuli. Patient is selectively mute during evaluation. Patient is currently recommended for inpatient  psychiatric treatment due to ongoing psychosis. Patient is unable to reliably contract for safety.  Past Psychiatric History: Psychosis, depression Past Medical History: Gestational  diabetes Family History: None reported Family Psychiatric  History: None reported Social History: Resides in [place], marijuana use (+UDS) , unemployed   Additional Social History: N/A   Sleep: Fair  Appetite:  Poor  Current Medications:  Current Facility-Administered Medications  Medication Dose Route Frequency Provider Last Rate Last Admin   acetaminophen (TYLENOL) tablet 650 mg  650 mg Oral Q6H PRN Onuoha, Chinwendu V, NP       alum & mag hydroxide-simeth (MAALOX/MYLANTA) 200-200-20 MG/5ML suspension 30 mL  30 mL Oral Q4H PRN Onuoha, Chinwendu V, NP       feeding supplement (ENSURE ENLIVE / ENSURE PLUS) liquid 237 mL  237 mL Oral BID BM Bing Neighbors, NP   237 mL at 08/14/22 1424   hydrOXYzine (ATARAX) tablet 25 mg  25 mg Oral TID PRN Onuoha, Chinwendu V, NP       ziprasidone (GEODON) injection 20 mg  20 mg Intramuscular Q12H PRN Onuoha, Chinwendu V, NP       And   LORazepam (ATIVAN) tablet 1 mg  1 mg Oral PRN Onuoha, Chinwendu V, NP       magnesium hydroxide (MILK OF MAGNESIA) suspension 30 mL  30 mL Oral Daily PRN Onuoha, Chinwendu V, NP       OLANZapine (ZYPREXA) injection 5 mg  5 mg Intramuscular Once Onuoha, Chinwendu V, NP       OLANZapine (ZYPREXA) tablet 5 mg  5 mg Oral BID Bing Neighbors, NP       traZODone (DESYREL) tablet 50 mg  50 mg Oral QHS PRN Onuoha, Chinwendu V, NP       No current outpatient medications on file.    Labs  Lab Results:  Admission on 08/12/2022  Component Date Value Ref Range Status   POC Amphetamine UR 08/15/2022 None Detected  NONE DETECTED (Cut Off Level 1000 ng/mL) Final   POC Secobarbital (BAR) 08/15/2022 None Detected  NONE DETECTED (Cut Off Level 300 ng/mL) Final   POC Buprenorphine (BUP) 08/15/2022 None Detected  NONE DETECTED (Cut Off Level 10 ng/mL) Final   POC Oxazepam (BZO) 08/15/2022 None Detected  NONE DETECTED (Cut Off Level 300 ng/mL) Final   POC Cocaine UR 08/15/2022 None Detected  NONE DETECTED (Cut Off Level 300  ng/mL) Final   POC Methamphetamine UR 08/15/2022 None Detected  NONE DETECTED (Cut Off Level 1000 ng/mL) Final   POC Morphine 08/15/2022 None Detected  NONE DETECTED (Cut Off Level 300 ng/mL) Final   POC Methadone UR 08/15/2022 None Detected  NONE DETECTED (Cut Off Level 300 ng/mL) Final   POC Oxycodone UR 08/15/2022 None Detected  NONE DETECTED (Cut Off Level 100 ng/mL) Final   POC Marijuana UR 08/15/2022 Positive (A)  NONE DETECTED (Cut Off Level 50 ng/mL) Final   Color, Urine 08/15/2022 AMBER (A)  YELLOW Final   BIOCHEMICALS MAY BE AFFECTED BY COLOR   APPearance 08/15/2022 HAZY (A)  CLEAR Final   Specific Gravity, Urine 08/15/2022 1.030  1.005 - 1.030 Final   pH 08/15/2022 5.0  5.0 - 8.0 Final   Glucose, UA 08/15/2022 NEGATIVE  NEGATIVE mg/dL Final   Hgb urine dipstick 08/15/2022 NEGATIVE  NEGATIVE Final   Bilirubin Urine 08/15/2022 NEGATIVE  NEGATIVE Final   Ketones, ur 08/15/2022 5 (A)  NEGATIVE mg/dL Final   Protein, ur 16/10/9602 NEGATIVE  NEGATIVE mg/dL Final   Nitrite 54/09/8117 NEGATIVE  NEGATIVE Final   Leukocytes,Ua 08/15/2022 SMALL (A)  NEGATIVE Final   RBC / HPF 08/15/2022 11-20  0 - 5 RBC/hpf Final   WBC, UA 08/15/2022 11-20  0 - 5 WBC/hpf Final   Bacteria, UA 08/15/2022 RARE (A)  NONE SEEN Final   Squamous Epithelial / HPF 08/15/2022 6-10  0 - 5 /HPF Final   Mucus 08/15/2022 PRESENT   Final   Performed at [facility], [address] [place], [place] [address]   SARS Coronavirus 2 by RT PCR 08/15/2022 NEGATIVE  NEGATIVE Final   Performed at [facility], [address], [place], [place] [address]  Admission on 08/12/2022, Discharged on 08/12/2022  Component Date Value Ref Range Status   WBC 08/12/2022 6.8  4.0 - 10.5 K/uL Final   RBC 08/12/2022 4.31  3.87 - 5.11 MIL/uL Final   Hemoglobin 08/12/2022 13.9  12.0 - 15.0 g/dL Final   HCT 30/86/5784 40.8  36.0 - 46.0 % Final   MCV 08/12/2022 94.7  80.0 - 100.0 fL Final   MCH 08/12/2022 32.3  26.0 - 34.0 pg  Final   MCHC 08/12/2022 34.1  30.0 - 36.0 g/dL Final   RDW 69/62/9528 11.5  11.5 - 15.5 % Final   Platelets 08/12/2022 284  150 - 400 K/uL Final   nRBC 08/12/2022 0.0  0.0 - 0.2 % Final   Neutrophils Relative % 08/12/2022 75  % Final   Neutro Abs 08/12/2022 5.0  1.7 - 7.7 K/uL Final   Lymphocytes Relative 08/12/2022 22  % Final   Lymphs Abs 08/12/2022 1.5  0.7 - 4.0 K/uL Final   Monocytes Relative 08/12/2022 3  % Final   Monocytes Absolute 08/12/2022 0.2  0.1 - 1.0 K/uL Final   Eosinophils Relative 08/12/2022 0  % Final   Eosinophils Absolute 08/12/2022 0.0  0.0 -  0.5 K/uL Final   Basophils Relative 08/12/2022 0  % Final   Basophils Absolute 08/12/2022 0.0  0.0 - 0.1 K/uL Final   Immature Granulocytes 08/12/2022 0  % Final   Abs Immature Granulocytes 08/12/2022 0.01  0.00 - 0.07 K/uL Final   Performed at [facility], [address], [place], [place] [address]   Sodium 08/12/2022 135  135 - 145 mmol/L Final   Potassium 08/12/2022 3.8  3.5 - 5.1 mmol/L Final   Chloride 08/12/2022 105  98 - 111 mmol/L Final   CO2 08/12/2022 21 (L)  22 - 32 mmol/L Final   Glucose, Bld 08/12/2022 80  70 - 99 mg/dL Final   Glucose reference range applies only to samples taken after fasting for at least 8 hours.   BUN 08/12/2022 12  6 - 20 mg/dL Final   Creatinine, Ser 08/12/2022 0.76  0.44 - 1.00 mg/dL Final   Calcium 62/13/0865 9.0  8.9 - 10.3 mg/dL Final   Total Protein 78/46/9629 7.2  6.5 - 8.1 g/dL Final   Albumin 52/84/1324 4.3  3.5 - 5.0 g/dL Final   AST 40/10/2723 15  15 - 41 U/L Final   ALT 08/12/2022 13  0 - 44 U/L Final   Alkaline Phosphatase 08/12/2022 39  38 - 126 U/L Final   Total Bilirubin 08/12/2022 0.9  0.3 - 1.2 mg/dL Final   GFR, Estimated 08/12/2022 >60  >60 mL/min Final   Comment: (NOTE) Calculated using the CKD-EPI Creatinine Equation (2021)    Anion gap 08/12/2022 9  5 - 15 Final   Performed at [facility], [address] [place], [place] [address]   Alcohol,  Ethyl (B) 08/12/2022 <10  <10 mg/dL Final   Comment: (NOTE) Lowest detectable limit for serum alcohol is 10 mg/dL.  For medical purposes only. Performed at [facility], [address], [place], [place] [address]   Admission on 08/12/2022, Discharged on 08/12/2022  Component Date Value Ref Range Status   Sodium 08/12/2022 140  135 - 145 mmol/L Final   Potassium 08/12/2022 3.4 (L)  3.5 - 5.1 mmol/L Final   Chloride 08/12/2022 102  98 - 111 mmol/L Final   CO2 08/12/2022 24  22 - 32 mmol/L Final   Glucose, Bld 08/12/2022 160 (H)  70 - 99 mg/dL Final   Glucose reference range applies only to samples taken after fasting for at least 8 hours.   BUN 08/12/2022 19  6 - 20 mg/dL Final   Creatinine, Ser 08/12/2022 0.96  0.44 - 1.00 mg/dL Final   Calcium 25/95/6387 8.5 (L)  8.9 - 10.3 mg/dL Final   Total Protein 56/43/3295 6.4 (L)  6.5 - 8.1 g/dL Final   Albumin 18/84/1660 3.6  3.5 - 5.0 g/dL Final   AST 63/01/6008 78 (H)  15 - 41 U/L Final   ALT 08/12/2022 57 (H)  0 - 44 U/L Final   Alkaline Phosphatase 08/12/2022 68  38 - 126 U/L Final   Total Bilirubin 08/12/2022 0.5  0.3 - 1.2 mg/dL Final   GFR, Estimated 08/12/2022 >60  >60 mL/min Final   Comment: (NOTE) Calculated using the CKD-EPI Creatinine Equation (2021)    Anion gap 08/12/2022 14  5 - 15 Final   Performed at [facility], [address], [place], [place] [address]   Magnesium 08/12/2022 2.1  1.7 - 2.4 mg/dL Final   Performed at [facility], [address], [place], [place] [address]  Admission on 04/20/2022, Discharged on 04/21/2022  Component Date Value Ref Range Status   WBC 04/20/2022 9.3  4.0 - 10.5 K/uL Final   RBC 04/20/2022 4.20  3.87 - 5.11 MIL/uL Final   Hemoglobin 04/20/2022 13.4  12.0 - 15.0 g/dL Final   HCT 65/78/4696 38.3  36.0 - 46.0 % Final   MCV 04/20/2022 91.2  80.0 - 100.0 fL Final   MCH 04/20/2022 31.9  26.0 - 34.0 pg Final   MCHC 04/20/2022 35.0  30.0 - 36.0 g/dL Final   RDW 29/52/8413  11.6  11.5 - 15.5 % Final   Platelets 04/20/2022 254  150 - 400 K/uL Final   nRBC 04/20/2022 0.0  0.0 - 0.2 % Final   Neutrophils Relative % 04/20/2022 73  % Final   Neutro Abs 04/20/2022 6.8  1.7 - 7.7 K/uL Final   Lymphocytes Relative 04/20/2022 21  % Final   Lymphs Abs 04/20/2022 1.9  0.7 - 4.0 K/uL Final   Monocytes Relative 04/20/2022 5  % Final   Monocytes Absolute 04/20/2022 0.5  0.1 - 1.0 K/uL Final   Eosinophils Relative 04/20/2022 0  % Final   Eosinophils Absolute 04/20/2022 0.0  0.0 - 0.5 K/uL Final   Basophils Relative 04/20/2022 1  % Final   Basophils Absolute 04/20/2022 0.1  0.0 - 0.1 K/uL Final   Immature Granulocytes 04/20/2022 0  % Final   Abs Immature Granulocytes 04/20/2022 0.02  0.00 - 0.07 K/uL Final   Performed at [facility], [address] [place], [place] [address]   Sodium 04/20/2022 139  135 - 145 mmol/L Final   Potassium 04/20/2022 3.5  3.5 - 5.1 mmol/L Final   Chloride 04/20/2022 107  98 - 111 mmol/L Final   CO2 04/20/2022 25  22 - 32 mmol/L Final   Glucose, Bld 04/20/2022 83  70 - 99 mg/dL Final   Glucose reference range applies only to samples taken after fasting for at least 8 hours.   BUN 04/20/2022 5 (L)  6 - 20 mg/dL Final   Creatinine, Ser 04/20/2022 0.80  0.44 - 1.00 mg/dL Final   Calcium 02/72/5366 8.9  8.9 - 10.3 mg/dL Final   Total Protein 44/03/4740 7.5  6.5 - 8.1 g/dL Final   Albumin 59/56/3875 4.5  3.5 - 5.0 g/dL Final   AST 64/33/2951 15  15 - 41 U/L Final   ALT 04/20/2022 12  0 - 44 U/L Final   Alkaline Phosphatase 04/20/2022 35 (L)  38 - 126 U/L Final   Total Bilirubin 04/20/2022 0.6  0.3 - 1.2 mg/dL Final   GFR, Estimated 04/20/2022 >60  >60 mL/min Final   Comment: (NOTE) Calculated using the CKD-EPI Creatinine Equation (2021)    Anion gap 04/20/2022 7  5 - 15 Final   Performed at [facility], [address], [place], [place] [address]   I-stat hCG, quantitative 04/20/2022 <5.0  <5 mIU/mL  Final   Comment 3 04/20/2022          Final   Comment:   GEST. AGE      CONC.  (mIU/mL)   <=1 WEEK        5 - 50     2 WEEKS       50 - 500     3 WEEKS       100 - 10,000     4 WEEKS     1,000 - 30,000        FEMALE  AND NON-PREGNANT FEMALE:     LESS THAN 5 mIU/mL    Opiates 04/20/2022 NONE DETECTED  NONE DETECTED Final   Cocaine 04/20/2022 NONE DETECTED  NONE DETECTED Final   Benzodiazepines 04/20/2022 NONE DETECTED  NONE DETECTED Final   Amphetamines 04/20/2022 NONE DETECTED  NONE DETECTED Final   Tetrahydrocannabinol 04/20/2022 POSITIVE (A)  NONE DETECTED Final   Barbiturates 04/20/2022 NONE DETECTED  NONE DETECTED Final   Comment: (NOTE) DRUG SCREEN FOR MEDICAL PURPOSES ONLY.  IF CONFIRMATION IS NEEDED FOR ANY PURPOSE, NOTIFY LAB WITHIN 5 DAYS.  LOWEST DETECTABLE LIMITS FOR URINE DRUG SCREEN Drug Class                     Cutoff (ng/mL) Amphetamine and metabolites    1000 Barbiturate and metabolites    200 Benzodiazepine                 200 Opiates and metabolites        300 Cocaine and metabolites        300 THC                            50 Performed at [facility], [address], [place], [place] [address]    Alcohol, Ethyl (B) 04/20/2022 <10  <10 mg/dL Final   Comment: (NOTE) Lowest detectable limit for serum alcohol is 10 mg/dL.  For medical purposes only. Performed at [facility], [address], [place], [place] [address]    Salicylate Lvl 04/20/2022 <7.0 (L)  7.0 - 30.0 mg/dL Final   Performed at [facility], [address], [place], [place] [address]   Acetaminophen (Tylenol), Serum 04/20/2022 <10 (L)  10 - 30 ug/mL Final   Comment: (NOTE) Therapeutic concentrations vary significantly. A range of 10-30 ug/mL  may be an effective concentration for many patients. However, some  are best treated at concentrations outside of this range. Acetaminophen concentrations >150 ug/mL at 4 hours after  ingestion  and >50 ug/mL at 12 hours after ingestion are often associated with  toxic reactions.  Performed at [facility], [address] [place], [place] [address]    SARS Coronavirus 2 by RT PCR 04/20/2022 NEGATIVE  NEGATIVE Final   Comment: (NOTE) SARS-CoV-2 target nucleic acids are NOT DETECTED.  The SARS-CoV-2 RNA is generally detectable in upper respiratory specimens during the acute phase of infection. The lowest concentration of SARS-CoV-2 viral copies this assay can detect is 138 copies/mL. A negative result does not preclude SARS-Cov-2 infection and should not be used as the sole basis for treatment or other patient management decisions. A negative result may occur with  improper specimen collection/handling, submission of specimen other than nasopharyngeal swab, presence of viral mutation(s) within the areas targeted by this assay, and inadequate number of viral copies(<138 copies/mL). A negative result must be combined with clinical observations, patient history, and epidemiological information. The expected result is Negative.  Fact Sheet for Patients:  BloggerCourse.com  Fact Sheet for Healthcare Providers:  SeriousBroker.it  This test is no                          t yet approved or cleared by the [place] FDA and  has been authorized for detection and/or diagnosis of SARS-CoV-2 by FDA under an Emergency Use Authorization (EUA). This EUA will remain  in effect (meaning this  test can be used) for the duration of the COVID-19 declaration under Section 564(b)(1) of the Act, 21 U.S.C.section 360bbb-3(b)(1), unless the authorization is terminated  or revoked sooner.       Influenza A by PCR 04/20/2022 NEGATIVE  NEGATIVE Final   Influenza B by PCR 04/20/2022 NEGATIVE  NEGATIVE Final   Comment: (NOTE) The Xpert Xpress SARS-CoV-2/FLU/RSV plus assay is intended as an aid in the diagnosis of  influenza from Nasopharyngeal swab specimens and should not be used as a sole basis for treatment. Nasal washings and aspirates are unacceptable for Xpert Xpress SARS-CoV-2/FLU/RSV testing.  Fact Sheet for Patients: BloggerCourse.com  Fact Sheet for Healthcare Providers: SeriousBroker.it  This test is not yet approved or cleared by the [place] FDA and has been authorized for detection and/or diagnosis of SARS-CoV-2 by FDA under an Emergency Use Authorization (EUA). This EUA will remain in effect (meaning this test can be used) for the duration of the COVID-19 declaration under Section 564(b)(1) of the Act, 21 U.S.C. section 360bbb-3(b)(1), unless the authorization is terminated or revoked.     Resp Syncytial Virus by PCR 04/20/2022 NEGATIVE  NEGATIVE Final   Comment: (NOTE) Fact Sheet for Patients: BloggerCourse.com  Fact Sheet for Healthcare Providers: SeriousBroker.it  This test is not yet approved or cleared by the [place] FDA and has been authorized for detection and/or diagnosis of SARS-CoV-2 by FDA under an Emergency Use Authorization (EUA). This EUA will remain in effect (meaning this test can be used) for the duration of the COVID-19 declaration under Section 564(b)(1) of the Act, 21 U.S.C. section 360bbb-3(b)(1), unless the authorization is terminated or revoked.  Performed at [facility], [address] [place], [place] [address]    TSH 04/20/2022 1.707  0.350 - 4.500 uIU/mL Final   Comment: Performed by a 3rd Generation assay with a functional sensitivity of <=0.01 uIU/mL. Performed at [facility], [address] [place], [place] [address]    Cholesterol 04/20/2022 180  0 - 200 mg/dL Final   Triglycerides 82/95/6213 88  <150 mg/dL Final   HDL 08/65/7846 42  >40 mg/dL Final   Total CHOL/HDL Ratio 04/20/2022 4.3   RATIO Final   VLDL 04/20/2022 18  0 - 40 mg/dL Final   LDL Cholesterol 04/20/2022 120 (H)  0 - 99 mg/dL Final   Comment:        Total Cholesterol/HDL:CHD Risk Coronary Heart Disease Risk Table                     Men   Women  1/2 Average Risk   3.4   3.3  Average Risk       5.0   4.4  2 X Average Risk   9.6   7.1  3 X Average Risk  23.4   11.0        Use the calculated Patient Ratio above and the CHD Risk Table to determine the patient's CHD Risk.        ATP III CLASSIFICATION (LDL):  <100     mg/dL   Optimal  962-952  mg/dL   Near or Above                    Optimal  130-159  mg/dL   Borderline  841-324  mg/dL   High  >401     mg/dL   Very High Performed at [facility], [address] [place], [place] [address]  Hgb A1c MFr Bld 04/20/2022 4.7 (L)  4.8 - 5.6 % Final   Comment: (NOTE) Pre diabetes:          5.7%-6.4%  Diabetes:              >6.4%  Glycemic control for   <7.0% adults with diabetes    Mean Plasma Glucose 04/20/2022 88.19  mg/dL Final   Performed at [facility], [address] [place], [place] [address]    Blood Alcohol level:  Lab Results  Component Value Date   [facility] <10 08/12/2022   ETH <10 04/20/2022    Metabolic Disorder Labs: Lab Results  Component Value Date   HGBA1C 4.7 (L) 04/20/2022   MPG 88.19 04/20/2022   No results found for: "PROLACTIN" Lab Results  Component Value Date   CHOL 180 04/20/2022   TRIG 88 04/20/2022   HDL 42 04/20/2022   CHOLHDL 4.3 04/20/2022   VLDL 18 04/20/2022   LDLCALC 120 (H) 04/20/2022    Therapeutic Lab Levels: No results found for: "LITHIUM" No results found for: "VALPROATE" No results found for: "CBMZ"  Physical Findings   AUDIT    Flowsheet Row [facility]  Alcohol Use Disorder Identification Test Final Score (AUDIT) 1      Flowsheet Row ED from 08/12/2022 in [facility] Most recent reading at 08/13/2022  1:21 AM ED from 08/12/2022 in [facility] Emergency Department at [facility] Most recent reading at 08/12/2022  5:00 PM ED from 08/12/2022 in [facility] Most recent reading at 08/12/2022  1:20 PM  C-SSRS RISK CATEGORY No Risk No Risk No Risk        Musculoskeletal  Strength & Muscle Tone: within normal limits Gait & Station: normal Patient leans: N/A  Psychiatric Specialty Exam  Presentation  General Appearance:  Casual  Eye Contact: Fair  Speech: Clear and Coherent  Speech Volume: Normal  Handedness: Right   Mood and Affect  Mood: Labile; Irritable  Affect: Labile   Thought Process  Thought Processes: Disorganized  Descriptions of Associations:Tangential  Orientation:Full (Time, Place and Person)  Thought Content:Tangential; Paranoid Ideation  Diagnosis of Schizophrenia or Schizoaffective disorder in past: No  Duration of Psychotic Symptoms: Less than six months   Hallucinations:No data recorded Ideas of Reference:Paranoia  Suicidal Thoughts:No data recorded Homicidal Thoughts:No data recorded  Sensorium  Memory: Immediate Fair  Judgment: Impaired  Insight: Lacking   Executive Functions  Concentration: Poor  Attention Span: Poor  Recall: [employer] of Knowledge: Fair  Language: Fair   Psychomotor Activity  Psychomotor Activity:Normal   Assets  Assets: Manufacturing systems engineer; Desire for Improvement   Sleep  Sleep:fair   (pt will not quantify specifically) Physical Exam   Physical Exam Vitals reviewed.  Constitutional:      Appearance: Normal appearance.  HENT:     Head: Normocephalic and atraumatic.  Eyes:     Extraocular Movements: Extraocular movements intact.     Conjunctiva/sclera: Conjunctivae normal.     Pupils: Pupils are equal, round, and reactive to light.  Cardiovascular:     Rate and Rhythm: Normal rate and regular rhythm.   Pulmonary:     Effort: Pulmonary effort is normal.     Breath sounds: Normal breath sounds.  Skin:    General: Skin is warm and dry.  Neurological:     General: No focal deficit present.  Mental Status: She is alert.       Review of Systems  Psychiatric/Behavioral:  Positive for hallucinations and substance abuse. The patient is nervous/anxious.   Blood pressure 105/62, pulse 78, temperature 97.6 F (36.4 C), temperature source Oral, resp. rate 17, SpO2 100%. There is no height or weight on file to calculate BMI.  Treatment Plan Summary: Daily contact with patient to assess and evaluate symptoms and progress in treatment, Medication management, and Plan Inpatient psychiatric treatment     Cloyde Reams, PMHNP-BC  Behavioral Health Service Line  [facility]  [phone]  08/15/2022 10:50 PM

## 2022-08-15 NOTE — Progress Notes (Signed)
CSW sent additional BH information to [facility] for review. CSW will continue to monitor the patient to secure recommended disposition.    Damita Dunnings, MSW, LCSW-A  11:34 AM 08/15/2022

## 2022-08-15 NOTE — ED Notes (Signed)
Patient aggravated. Hitting phone on wall. Verbal de-escalation  utilized.

## 2022-08-15 NOTE — Progress Notes (Signed)
Patient has been denied by [facility] due to no appropriate beds available. Patient meets BH inpatient criteria per Joaquin Courts, NP. Patient has been faxed out to the following facilities:   [facility]  [address] [place], [place] [address] [phone]  [facility]  [address] [place] [address] [phone]  [facility]  [address] [place] [address] [phone]  [facility]  [address] [place], [place] [address] [phone]  [facility]  [address] [place] [address] [phone]  [facility]  [address], [place] [address] [phone]  [facility]  [address] [place] [address] [phone]  [facility]  [address], [place] [address] [phone]  [facility]  [address], [place] [address] [phone]  [facility]  [address], [place] [address] [phone]  [facility]  [address], [place] [address] [phone]  [facility]  [address] [place] [address] [phone]  [facility]  [address], [place] [address] [phone]  [facility]  [address], [place] [address] [phone]  [facility]  [address] [place] [address] [phone]  [facility]  [address], [place] [address] [phone]  [facility]  [address], [place] [address] [phone]  [facility]  [address] [place] [address] [phone]  [facility]  [address], [place] [address] [phone]  [facility] [address] [place]  [address] [place] [address] [phone]  [facility]  [address] [place], [place] [address] [phone]  [facility] [address]  [address], [place] [address] [phone]  [facility]  [address] [place] [address] [phone]  [facility]  [address] [place] [address] [phone]  [facility]  [address] [place] [address] [phone]  [facility]  [address] [place] [address] [phone]  [facility]  [address], [place] [address] [phone]  [facility]  [address] [place] [address] [phone]  [facility]  [address] [place] [address] [phone]  [facility]  [address], [place] [address] [phone]   Damita Dunnings, MSW, LCSW-A  10:10 AM 08/15/2022

## 2022-08-15 NOTE — ED Notes (Signed)
Pt  awake, alert & responsive, no distress noted.   Pt remains irritable and labile. Guarded, resistant to care.  Pt is IVC, monitoring for safety.

## 2022-08-15 NOTE — ED Notes (Signed)
Patient agitated - denies SI/Hi/AVH - will continue to monitor for safety

## 2022-08-16 NOTE — ED Notes (Signed)
Pt A&O x 4,  guarded, resistant to care, irritable and labile,  Pt is IVC.  Pending [facility] on 08/17/22.Therapist, music.  Monitoring for safety.

## 2022-08-16 NOTE — ED Notes (Signed)
Patient refused vitals per MHT

## 2022-08-16 NOTE — Progress Notes (Signed)
Pt was accepted to [facility] [place], [place] [address] Mailing address:[address], [place] [address] TOMORROW 08/17/2022;PENDING [employer] and IVC paperwork to be faxed to [phone].  Pt meets inpatient criteria per Julaine Fusi  Attending Physician will be Dr.Vincent Kathrine Cords, MD  Report can be called to: - [phone]  Pt can arrive after 7:00am  Care Team notified: Day [facility] Danika Haywood Filler, [address] [place],LCSW   [place], [place] 08/16/2022 @ 6:25 PM

## 2022-08-16 NOTE — ED Provider Notes (Signed)
Behavioral Health Progress Note  Date and Time: 08/16/2022 5:12 PM Name: Christy [name] MRN:  [id number]  Subjective:  "I don't stink or nothing"  Diagnosis:  Final diagnoses:  Psychosis, unspecified psychosis type (HCC)  Marijuana use  Schizophreniform disorder (HCC)    Total Time spent with patient: 30 minutes Christy [name] is a 27 year old female with a history of schizoaffective disorder and depression initially presented to [facility] on 08/12/2022 via police escort, under IVC petition, petitioned by her mother due to concerns that patient is mentally decompensating. Per admission note, patient initially presented to [facility] on 08/12/2022 with active delusional thoughts, tangential speech and paranoia. Patient has a history of prior psychiatric hospital admissions. 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 Patient was discharged with home medications: Olanzapine 10 mg , Trazodone 50 mg at bedtime, and hydroxyzine 25 mg, 3 times daily.   Patient seen face-to-face by this provider, chart reviewed, and case consult with Dr. Lucianne Muss on 08/16/2022.  On reevaluation today, patient is observed laying in the bed awake with the covers over her head.  Upon approach she is alert.  However she will not answer any questions.  She will not participate in the assessment.  Reassurance and support provided.  Patient continued to be unwilling to participate   Past Psychiatric History: Psychosis, depression Past Medical History: Gestational diabetes Family History: None reported Family Psychiatric  History: None reported Social History: Resides in [place], marijuana use (+UDS) , unemployed   Additional Social History: N/A   Sleep: Fair  Appetite:  Poor  Current Medications:  Current Facility-Administered Medications  Medication Dose Route Frequency Provider Last  Rate Last Admin   acetaminophen (TYLENOL) tablet 650 mg  650 mg Oral Q6H PRN Onuoha, Chinwendu V, NP       alum & mag hydroxide-simeth (MAALOX/MYLANTA) 200-200-20 MG/5ML suspension 30 mL  30 mL Oral Q4H PRN Onuoha, Chinwendu V, NP       feeding supplement (ENSURE ENLIVE / ENSURE PLUS) liquid 237 mL  237 mL Oral BID BM Bing Neighbors, NP   237 mL at 08/16/22 1038   hydrOXYzine (ATARAX) tablet 25 mg  25 mg Oral TID PRN Onuoha, Chinwendu V, NP       ziprasidone (GEODON) injection 20 mg  20 mg Intramuscular Q12H PRN Onuoha, Chinwendu V, NP       And   LORazepam (ATIVAN) tablet 1 mg  1 mg Oral PRN Onuoha, Chinwendu V, NP       magnesium hydroxide (MILK OF MAGNESIA) suspension 30 mL  30 mL Oral Daily PRN Onuoha, Chinwendu V, NP       OLANZapine (ZYPREXA) injection 5 mg  5 mg Intramuscular Once Onuoha, Chinwendu V, NP       OLANZapine (ZYPREXA) tablet 5 mg  5 mg Oral BID Bing Neighbors, NP   5 mg at 08/16/22 1038   traZODone (DESYREL) tablet 50 mg  50 mg Oral QHS PRN Onuoha, Chinwendu V, NP       No current outpatient medications on file.    Labs  Lab Results:  Admission on 08/12/2022  Component Date Value Ref Range Status   POC Amphetamine UR 08/15/2022 None Detected  NONE DETECTED (Cut Off Level 1000 ng/mL) Final   POC Secobarbital (BAR) 08/15/2022 None Detected  NONE DETECTED (Cut Off Level 300 ng/mL) Final  POC Buprenorphine (BUP) 08/15/2022 None Detected  NONE DETECTED (Cut Off Level 10 ng/mL) Final   POC Oxazepam (BZO) 08/15/2022 None Detected  NONE DETECTED (Cut Off Level 300 ng/mL) Final   POC Cocaine UR 08/15/2022 None Detected  NONE DETECTED (Cut Off Level 300 ng/mL) Final   POC Methamphetamine UR 08/15/2022 None Detected  NONE DETECTED (Cut Off Level 1000 ng/mL) Final   POC Morphine 08/15/2022 None Detected  NONE DETECTED (Cut Off Level 300 ng/mL) Final   POC Methadone UR 08/15/2022 None Detected  NONE DETECTED (Cut Off Level 300 ng/mL) Final   POC Oxycodone UR 08/15/2022  None Detected  NONE DETECTED (Cut Off Level 100 ng/mL) Final   POC Marijuana UR 08/15/2022 Positive (A)  NONE DETECTED (Cut Off Level 50 ng/mL) Final   Color, Urine 08/15/2022 AMBER (A)  YELLOW Final   BIOCHEMICALS MAY BE AFFECTED BY COLOR   APPearance 08/15/2022 HAZY (A)  CLEAR Final   Specific Gravity, Urine 08/15/2022 1.030  1.005 - 1.030 Final   pH 08/15/2022 5.0  5.0 - 8.0 Final   Glucose, UA 08/15/2022 NEGATIVE  NEGATIVE mg/dL Final   Hgb urine dipstick 08/15/2022 NEGATIVE  NEGATIVE Final   Bilirubin Urine 08/15/2022 NEGATIVE  NEGATIVE Final   Ketones, ur 08/15/2022 5 (A)  NEGATIVE mg/dL Final   Protein, ur 16/10/9602 NEGATIVE  NEGATIVE mg/dL Final   Nitrite 54/09/8117 NEGATIVE  NEGATIVE Final   Leukocytes,Ua 08/15/2022 SMALL (A)  NEGATIVE Final   RBC / HPF 08/15/2022 11-20  0 - 5 RBC/hpf Final   WBC, UA 08/15/2022 11-20  0 - 5 WBC/hpf Final   Bacteria, UA 08/15/2022 RARE (A)  NONE SEEN Final   Squamous Epithelial / HPF 08/15/2022 6-10  0 - 5 /HPF Final   Mucus 08/15/2022 PRESENT   Final   Performed at [facility], [address] [place], [place] [address]   SARS Coronavirus 2 by RT PCR 08/15/2022 NEGATIVE  NEGATIVE Final   Performed at [facility], [address], [place], [place] [address]  Admission on 08/12/2022, Discharged on 08/12/2022  Component Date Value Ref Range Status   WBC 08/12/2022 6.8  4.0 - 10.5 K/uL Final   RBC 08/12/2022 4.31  3.87 - 5.11 MIL/uL Final   Hemoglobin 08/12/2022 13.9  12.0 - 15.0 g/dL Final   HCT 30/86/5784 40.8  36.0 - 46.0 % Final   MCV 08/12/2022 94.7  80.0 - 100.0 fL Final   MCH 08/12/2022 32.3  26.0 - 34.0 pg Final   MCHC 08/12/2022 34.1  30.0 - 36.0 g/dL Final   RDW 69/62/9528 11.5  11.5 - 15.5 % Final   Platelets 08/12/2022 284  150 - 400 K/uL Final   nRBC 08/12/2022 0.0  0.0 - 0.2 % Final   Neutrophils Relative % 08/12/2022 75  % Final   Neutro Abs 08/12/2022 5.0  1.7 - 7.7 K/uL Final   Lymphocytes Relative 08/12/2022 22   % Final   Lymphs Abs 08/12/2022 1.5  0.7 - 4.0 K/uL Final   Monocytes Relative 08/12/2022 3  % Final   Monocytes Absolute 08/12/2022 0.2  0.1 - 1.0 K/uL Final   Eosinophils Relative 08/12/2022 0  % Final   Eosinophils Absolute 08/12/2022 0.0  0.0 - 0.5 K/uL Final   Basophils Relative 08/12/2022 0  % Final   Basophils Absolute 08/12/2022 0.0  0.0 - 0.1 K/uL Final   Immature Granulocytes 08/12/2022 0  % Final   Abs Immature Granulocytes 08/12/2022 0.01  0.00 -  0.07 K/uL Final   Performed at [facility], [address], [place], [place] [address]   Sodium 08/12/2022 135  135 - 145 mmol/L Final   Potassium 08/12/2022 3.8  3.5 - 5.1 mmol/L Final   Chloride 08/12/2022 105  98 - 111 mmol/L Final   CO2 08/12/2022 21 (L)  22 - 32 mmol/L Final   Glucose, Bld 08/12/2022 80  70 - 99 mg/dL Final   Glucose reference range applies only to samples taken after fasting for at least 8 hours.   BUN 08/12/2022 12  6 - 20 mg/dL Final   Creatinine, Ser 08/12/2022 0.76  0.44 - 1.00 mg/dL Final   Calcium 19/14/7829 9.0  8.9 - 10.3 mg/dL Final   Total Protein 56/21/3086 7.2  6.5 - 8.1 g/dL Final   Albumin 57/84/6962 4.3  3.5 - 5.0 g/dL Final   AST 95/28/4132 15  15 - 41 U/L Final   ALT 08/12/2022 13  0 - 44 U/L Final   Alkaline Phosphatase 08/12/2022 39  38 - 126 U/L Final   Total Bilirubin 08/12/2022 0.9  0.3 - 1.2 mg/dL Final   GFR, Estimated 08/12/2022 >60  >60 mL/min Final   Comment: (NOTE) Calculated using the CKD-EPI Creatinine Equation (2021)    Anion gap 08/12/2022 9  5 - 15 Final   Performed at [facility], [address], [place], [place] [address]   Alcohol, Ethyl (B) 08/12/2022 <10  <10 mg/dL Final   Comment: (NOTE) Lowest detectable limit for serum alcohol is 10 mg/dL.  For medical purposes only. Performed at [facility], [address] [place], [place] [address]   Admission on 08/12/2022, Discharged on 08/12/2022  Component Date Value Ref Range Status   Sodium  08/12/2022 140  135 - 145 mmol/L Final   Potassium 08/12/2022 3.4 (L)  3.5 - 5.1 mmol/L Final   Chloride 08/12/2022 102  98 - 111 mmol/L Final   CO2 08/12/2022 24  22 - 32 mmol/L Final   Glucose, Bld 08/12/2022 160 (H)  70 - 99 mg/dL Final   Glucose reference range applies only to samples taken after fasting for at least 8 hours.   BUN 08/12/2022 19  6 - 20 mg/dL Final   Creatinine, Ser 08/12/2022 0.96  0.44 - 1.00 mg/dL Final   Calcium 66/44/0347 8.5 (L)  8.9 - 10.3 mg/dL Final   Total Protein 42/59/5638 6.4 (L)  6.5 - 8.1 g/dL Final   Albumin 75/64/3329 3.6  3.5 - 5.0 g/dL Final   AST 51/88/4166 78 (H)  15 - 41 U/L Final   ALT 08/12/2022 57 (H)  0 - 44 U/L Final   Alkaline Phosphatase 08/12/2022 68  38 - 126 U/L Final   Total Bilirubin 08/12/2022 0.5  0.3 - 1.2 mg/dL Final   GFR, Estimated 08/12/2022 >60  >60 mL/min Final   Comment: (NOTE) Calculated using the CKD-EPI Creatinine Equation (2021)    Anion gap 08/12/2022 14  5 - 15 Final   Performed at [facility], [address], [place], [place] [address]   Magnesium 08/12/2022 2.1  1.7 - 2.4 mg/dL Final   Performed at [facility], [address], [place], [place] [address]  Admission on 04/20/2022, Discharged on 04/21/2022  Component Date Value Ref Range Status   WBC 04/20/2022 9.3  4.0 - 10.5 K/uL Final   RBC 04/20/2022 4.20  3.87 - 5.11 MIL/uL Final   Hemoglobin 04/20/2022 13.4  12.0 -  15.0 g/dL Final   HCT 40/98/1191 38.3  36.0 - 46.0 % Final   MCV 04/20/2022 91.2  80.0 - 100.0 fL Final   MCH 04/20/2022 31.9  26.0 - 34.0 pg Final   MCHC 04/20/2022 35.0  30.0 - 36.0 g/dL Final   RDW 47/82/9562 11.6  11.5 - 15.5 % Final   Platelets 04/20/2022 254  150 - 400 K/uL Final   nRBC 04/20/2022 0.0  0.0 - 0.2 % Final   Neutrophils Relative % 04/20/2022 73  % Final   Neutro Abs 04/20/2022 6.8  1.7 - 7.7 K/uL Final   Lymphocytes Relative 04/20/2022 21  % Final   Lymphs Abs 04/20/2022 1.9  0.7 - 4.0 K/uL Final   Monocytes  Relative 04/20/2022 5  % Final   Monocytes Absolute 04/20/2022 0.5  0.1 - 1.0 K/uL Final   Eosinophils Relative 04/20/2022 0  % Final   Eosinophils Absolute 04/20/2022 0.0  0.0 - 0.5 K/uL Final   Basophils Relative 04/20/2022 1  % Final   Basophils Absolute 04/20/2022 0.1  0.0 - 0.1 K/uL Final   Immature Granulocytes 04/20/2022 0  % Final   Abs Immature Granulocytes 04/20/2022 0.02  0.00 - 0.07 K/uL Final   Performed at [facility], [address] [place], [place] [address]   Sodium 04/20/2022 139  135 - 145 mmol/L Final   Potassium 04/20/2022 3.5  3.5 - 5.1 mmol/L Final   Chloride 04/20/2022 107  98 - 111 mmol/L Final   CO2 04/20/2022 25  22 - 32 mmol/L Final   Glucose, Bld 04/20/2022 83  70 - 99 mg/dL Final   Glucose reference range applies only to samples taken after fasting for at least 8 hours.   BUN 04/20/2022 5 (L)  6 - 20 mg/dL Final   Creatinine, Ser 04/20/2022 0.80  0.44 - 1.00 mg/dL Final   Calcium 57/84/6962 8.9  8.9 - 10.3 mg/dL Final   Total Protein 95/28/4132 7.5  6.5 - 8.1 g/dL Final   Albumin 44/01/270 4.5  3.5 - 5.0 g/dL Final   AST 53/66/4403 15  15 - 41 U/L Final   ALT 04/20/2022 12  0 - 44 U/L Final   Alkaline Phosphatase 04/20/2022 35 (L)  38 - 126 U/L Final   Total Bilirubin 04/20/2022 0.6  0.3 - 1.2 mg/dL Final   GFR, Estimated 04/20/2022 >60  >60 mL/min Final   Comment: (NOTE) Calculated using the CKD-EPI Creatinine Equation (2021)    Anion gap 04/20/2022 7  5 - 15 Final   Performed at [facility], [address], [place], [place] [address]   I-stat hCG, quantitative 04/20/2022 <5.0  <5 mIU/mL Final   Comment 3 04/20/2022          Final   Comment:   GEST. AGE      CONC.  (mIU/mL)   <=1 WEEK        5 - 50     2 WEEKS       50 - 500     3 WEEKS       100 - 10,000     4 WEEKS     1,000 - 30,000        FEMALE AND NON-PREGNANT FEMALE:     LESS THAN 5 mIU/mL    Opiates 04/20/2022 NONE DETECTED  NONE DETECTED Final    Cocaine 04/20/2022 NONE DETECTED  NONE DETECTED Final   Benzodiazepines 04/20/2022 NONE DETECTED  NONE DETECTED Final  Amphetamines 04/20/2022 NONE DETECTED  NONE DETECTED Final   Tetrahydrocannabinol 04/20/2022 POSITIVE (A)  NONE DETECTED Final   Barbiturates 04/20/2022 NONE DETECTED  NONE DETECTED Final   Comment: (NOTE) DRUG SCREEN FOR MEDICAL PURPOSES ONLY.  IF CONFIRMATION IS NEEDED FOR ANY PURPOSE, NOTIFY LAB WITHIN 5 DAYS.  LOWEST DETECTABLE LIMITS FOR URINE DRUG SCREEN Drug Class                     Cutoff (ng/mL) Amphetamine and metabolites    1000 Barbiturate and metabolites    200 Benzodiazepine                 200 Opiates and metabolites        300 Cocaine and metabolites        300 THC                            50 Performed at [facility], [address] [place], [place] [address]    Alcohol, Ethyl (B) 04/20/2022 <10  <10 mg/dL Final   Comment: (NOTE) Lowest detectable limit for serum alcohol is 10 mg/dL.  For medical purposes only. Performed at [facility], [address], [place], [place] [address]    Salicylate Lvl 04/20/2022 <7.0 (L)  7.0 - 30.0 mg/dL Final   Performed at [facility], [address], [place], [place] [address]   Acetaminophen (Tylenol), Serum 04/20/2022 <10 (L)  10 - 30 ug/mL Final   Comment: (NOTE) Therapeutic concentrations vary significantly. A range of 10-30 ug/mL  may be an effective concentration for many patients. However, some  are best treated at concentrations outside of this range. Acetaminophen concentrations >150 ug/mL at 4 hours after ingestion  and >50 ug/mL at 12 hours after ingestion are often associated with  toxic reactions.  Performed at [facility], [address], [place], [place] [address]    SARS Coronavirus 2 by RT PCR 04/20/2022 NEGATIVE  NEGATIVE Final   Comment: (NOTE) SARS-CoV-2 target nucleic acids are NOT DETECTED.  The  SARS-CoV-2 RNA is generally detectable in upper respiratory specimens during the acute phase of infection. The lowest concentration of SARS-CoV-2 viral copies this assay can detect is 138 copies/mL. A negative result does not preclude SARS-Cov-2 infection and should not be used as the sole basis for treatment or other patient management decisions. A negative result may occur with  improper specimen collection/handling, submission of specimen other than nasopharyngeal swab, presence of viral mutation(s) within the areas targeted by this assay, and inadequate number of viral copies(<138 copies/mL). A negative result must be combined with clinical observations, patient history, and epidemiological information. The expected result is Negative.  Fact Sheet for Patients:  BloggerCourse.com  Fact Sheet for Healthcare Providers:  SeriousBroker.it  This test is no                          t yet approved or cleared by the [place] FDA and  has been authorized for detection and/or diagnosis of SARS-CoV-2 by FDA under an Emergency Use Authorization (EUA). This EUA will remain  in effect (meaning this test can be used) for the duration of the COVID-19 declaration under Section 564(b)(1) of the Act, 21 U.S.C.section 360bbb-3(b)(1), unless the authorization is terminated  or revoked sooner.       Influenza A by PCR 04/20/2022 NEGATIVE  NEGATIVE Final  Influenza B by PCR 04/20/2022 NEGATIVE  NEGATIVE Final   Comment: (NOTE) The Xpert Xpress SARS-CoV-2/FLU/RSV plus assay is intended as an aid in the diagnosis of influenza from Nasopharyngeal swab specimens and should not be used as a sole basis for treatment. Nasal washings and aspirates are unacceptable for Xpert Xpress SARS-CoV-2/FLU/RSV testing.  Fact Sheet for Patients: BloggerCourse.com  Fact Sheet for Healthcare  Providers: SeriousBroker.it  This test is not yet approved or cleared by the [place] FDA and has been authorized for detection and/or diagnosis of SARS-CoV-2 by FDA under an Emergency Use Authorization (EUA). This EUA will remain in effect (meaning this test can be used) for the duration of the COVID-19 declaration under Section 564(b)(1) of the Act, 21 U.S.C. section 360bbb-3(b)(1), unless the authorization is terminated or revoked.     Resp Syncytial Virus by PCR 04/20/2022 NEGATIVE  NEGATIVE Final   Comment: (NOTE) Fact Sheet for Patients: BloggerCourse.com  Fact Sheet for Healthcare Providers: SeriousBroker.it  This test is not yet approved or cleared by the [place] FDA and has been authorized for detection and/or diagnosis of SARS-CoV-2 by FDA under an Emergency Use Authorization (EUA). This EUA will remain in effect (meaning this test can be used) for the duration of the COVID-19 declaration under Section 564(b)(1) of the Act, 21 U.S.C. section 360bbb-3(b)(1), unless the authorization is terminated or revoked.  Performed at [facility], [address], [place], [place] [address]    TSH 04/20/2022 1.707  0.350 - 4.500 uIU/mL Final   Comment: Performed by a 3rd Generation assay with a functional sensitivity of <=0.01 uIU/mL. Performed at [facility], [address] [place], [place] [address]    Cholesterol 04/20/2022 180  0 - 200 mg/dL Final   Triglycerides 29/56/2130 88  <150 mg/dL Final   HDL 86/57/8469 42  >40 mg/dL Final   Total CHOL/HDL Ratio 04/20/2022 4.3  RATIO Final   VLDL 04/20/2022 18  0 - 40 mg/dL Final   LDL Cholesterol 04/20/2022 120 (H)  0 - 99 mg/dL Final   Comment:        Total Cholesterol/HDL:CHD Risk Coronary Heart Disease Risk Table                     Men   Women  1/2 Average Risk   3.4   3.3  Average Risk       5.0    4.4  2 X Average Risk   9.6   7.1  3 X Average Risk  23.4   11.0        Use the calculated Patient Ratio above and the CHD Risk Table to determine the patient's CHD Risk.        ATP III CLASSIFICATION (LDL):  <100     mg/dL   Optimal  629-528  mg/dL   Near or Above                    Optimal  130-159  mg/dL   Borderline  413-244  mg/dL   High  >010     mg/dL   Very High Performed at [facility], [address], [place], [place] [address]    Hgb A1c MFr Bld 04/20/2022 4.7 (L)  4.8 - 5.6 % Final   Comment: (NOTE) Pre diabetes:          5.7%-6.4%  Diabetes:              >  6.4%  Glycemic control for   <7.0% adults with diabetes    Mean Plasma Glucose 04/20/2022 88.19  mg/dL Final   Performed at [facility], [address], [place], [place] [address]    Blood Alcohol level:  Lab Results  Component Value Date   [facility] <10 08/12/2022   ETH <10 04/20/2022    Metabolic Disorder Labs: Lab Results  Component Value Date   HGBA1C 4.7 (L) 04/20/2022   MPG 88.19 04/20/2022   No results found for: "PROLACTIN" Lab Results  Component Value Date   CHOL 180 04/20/2022   TRIG 88 04/20/2022   HDL 42 04/20/2022   CHOLHDL 4.3 04/20/2022   VLDL 18 04/20/2022   LDLCALC 120 (H) 04/20/2022    Therapeutic Lab Levels: No results found for: "LITHIUM" No results found for: "VALPROATE" No results found for: "CBMZ"  Physical Findings   AUDIT    Flowsheet Row [facility]  Alcohol Use Disorder Identification Test Final Score (AUDIT) 1      Flowsheet Row ED from 08/12/2022 in [facility] Most recent reading at 08/13/2022  1:21 AM ED from 08/12/2022 in [facility] Emergency Department at [facility] Most recent reading at 08/12/2022  5:00 PM ED from 08/12/2022 in [facility] Most recent reading at 08/12/2022  1:20 PM  C-SSRS RISK  CATEGORY No Risk No Risk No Risk        Musculoskeletal  Strength & Muscle Tone: within normal limits Gait & Station: normal Patient leans: N/A  Psychiatric Specialty Exam  Presentation  General Appearance:  Casual  Eye Contact: Fair  Speech: Clear and Coherent  Speech Volume: Normal  Handedness: Right   Mood and Affect  Mood: Labile; Irritable  Affect: Labile   Thought Process  Thought Processes: Disorganized  Descriptions of Associations:Tangential  Orientation:Full (Time, Place and Person)  Thought Content:Tangential; Paranoid Ideation  Diagnosis of Schizophrenia or Schizoaffective disorder in past: No  Duration of Psychotic Symptoms: Less than six months   Hallucinations:No data recorded Ideas of Reference:Paranoia  Suicidal Thoughts:No data recorded Homicidal Thoughts:No data recorded  Sensorium  Memory: Immediate Fair  Judgment: Impaired  Insight: Lacking   Executive Functions  Concentration: Poor  Attention Span: Poor  Recall: [employer] of Knowledge: Fair  Language: Fair   Psychomotor Activity  Psychomotor Activity:Normal   Assets  Assets: Manufacturing systems engineer; Desire for Improvement   Sleep  Sleep:fair   (pt will not quantify specifically) Physical Exam   Physical Exam Vitals reviewed.  Constitutional:      Appearance: Normal appearance.  HENT:     Head: Normocephalic and atraumatic.  Eyes:     Extraocular Movements: Extraocular movements intact.     Conjunctiva/sclera: Conjunctivae normal.     Pupils: Pupils are equal, round, and reactive to light.  Cardiovascular:     Rate and Rhythm: Normal rate and regular rhythm.  Pulmonary:     Effort: Pulmonary effort is normal.     Breath sounds: Normal breath sounds.  Skin:    General: Skin is warm and dry.  Neurological:     General: No focal deficit present.     Mental Status: She is alert.       Review of Systems  PT refuses  Blood pressure  105/62, pulse 78, temperature 97.6 F (36.4 C), temperature source Oral, resp. rate 17, SpO2 100%. There is no height or weight on file  to calculate BMI.  Treatment Plan Summary:  Daily contact with patient to assess and evaluate symptoms and progress in treatment, Medication management, and Plan Inpatient psychiatric treatment     Vernard Gambles PMHNP-BC  Behavioral Health Service Line  [facility]  [phone]  08/16/2022 1150 PM

## 2022-08-16 NOTE — Progress Notes (Addendum)
This CSW send PENDING items to [facility] via EPIC: Facesheet and IVC paperwork.    Maryjean Ka, MSW, [facility] 08/16/2022 6:55 PM

## 2022-08-16 NOTE — ED Notes (Signed)
Patient awake and remained resting in bed with blankets covering head. Initially, patient appeared guarded and hesitant to speak or look at this writer but then patient agreed to sit up and took her scheduled medication along with some orange juice. Patient also took her ensure saying she will drink it later and agreed to eat a sandwich. Patient appears calmer and cooperative this morning thus far. Denies SI,HI, and A/V/H. No s/s of current distress.

## 2022-08-16 NOTE — ED Notes (Signed)
Pt is awake and she came to desk and wanted to know the medications she was given tonight and it was written down for her she is speaking with other patients on unit she is calm and cooperative no pain or distress noted will continue to monitor for safety.

## 2022-08-16 NOTE — ED Notes (Signed)
Pt awake & resting quietly at present. No distress noted.  Monitoring for safety.

## 2022-08-16 NOTE — ED Notes (Signed)
Patient observed sleeping with breathing even and unlabored. No s/s of current distress.

## 2022-08-16 NOTE — ED Notes (Addendum)
Pt remains sitting up at bedside staring at staff, no distress noted, calm at present.  Monitoring for safety.

## 2022-08-16 NOTE — Progress Notes (Signed)
LCSW Progress Note  [id number]   Christy [name]  08/16/2022  1:21 PM  Description:   Inpatient Psychiatric Referral  Patient was recommended inpatient per Vernard Gambles, NP. There are no available beds at [facility], per [facility] Rona Ravens, RN. Patient was referred to the following out of network facilities:   Destination  Service Provider Address Phone Fax  [facility]  [address] [place], [place] [address] [phone]  [facility]  [address] [place] [address] [phone]  [facility]  [address] [place] [address] [phone]  [facility]  [address] [place], [place] [address] [phone]  [facility]  [address] [place] [address] [phone]  [facility]  [address] [place] [address] [phone]  [facility]  [address] [place] [address] [phone]  [facility]  [address], [place] [address] [phone]  [facility]  [address], [place] [address] [phone]  [facility]  [address] [place] [address] [phone]  [facility]  [address] [place] [address] [phone]  [facility]  [address] [place] [address] [phone]  [facility]  [address], [place] [address] [phone]  [facility]  [address] [place] [address] [phone]  [facility]  [address] [place] [address] [phone]  [facility]   [address] [place] [address] [phone]  [facility]  [address], [place] [address] [phone]  [facility]  [address] [place] [address] [phone]  [facility]  [address] [place] [address] [phone]  [facility] [address] [place]  [address] [place] [address] [phone]  [facility]  [address] [place], [place] [address] [phone]  [facility] [address]  [address] [place] [address] [phone]  [facility]  [address] [place] [address] [phone]  [facility]  [address] [place] [address] [phone]  [facility]  [address] [place] [address] [phone]  [facility]  [phone] [address] [place] [address] [phone]  [facility]  [address] [place] [address] [phone]  [facility]  [address] [place] [address] [phone]  [facility]  [address] [place] [address] [phone]  [facility]  [address] [place] [address] [phone]    Situation ongoing, CSW to continue following and update chart as more information becomes available.      Cathie Beams, LCSW  08/16/2022 1:21 PM

## 2022-08-17 MED ORDER — OLANZAPINE 5 MG PO TABS: 5.0000 mg | ORAL_TABLET | Freq: Two times a day (BID) | ORAL | Status: DC

## 2022-08-17 NOTE — ED Notes (Signed)
Pt sleeping at present, no distress noted.  Monitoring for safety. 

## 2022-08-17 NOTE — ED Notes (Signed)
Pt discharged to [facility]. Sheriff here to transport pt. @0900 . Pt alert, oriented, and ambulatory. Denies SI, HI, & AVH.  Safety maintained. Facility notified of pt transport & report called into [facility].

## 2022-08-17 NOTE — ED Provider Notes (Signed)
FBC/OBS ASAP Discharge Summary  Date and Time: 08/17/2022 3:13 PM  Name: Christy [name]  MRN:  [id number]   Discharge Diagnoses:  Final diagnoses:  Psychosis, unspecified psychosis type (HCC)  Marijuana use  Schizophreniform disorder (HCC)    Christy [name] is a 27 year old female with a history of schizoaffective disorder and depression initially presented to [facility] on 08/12/2022 via police escort, under IVC petition, petitioned by her mother due to concerns that patient is mentally decompensating. Per admission note, patient [facility] with active delusional thoughts, tangential speech and paranoia. Patient has a history of prior psychiatric hospital admissions. 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 Patient was discharged with home medications: Olanzapine 10 mg , Trazodone 50 mg at bedtime, and hydroxyzine 25 mg, 3 times daily.    Patient seen face-to-face by this provider, chart reviewed on 08/17/2022.  Subjective:   On today's assessment patient continues to be not cooperative.  She refuses to speak.  She only looks and gives this Clinical research associate an angry look.  Provided reassurance and support.  Per nursing patient did speak with 1 staff member.  She is eating and drinking appropriately.  She did sleep throughout the night.  She required no as needed medications for agitation throughout the night..  Stay Summary:   Patient continues to be inpatient psychiatric admission criteria.  Patient [facility] via law enforcement.  Patient remains under IVC.  Total Time spent with patient: 15 minutes  Past Psychiatric History: see h&P Past Medical History: see h&P Family History: see h&P Family Psychiatric History: see h&P Social History: see h&P Tobacco Cessation:  N/A, patient  does not currently use tobacco products  Current Medications:  No current facility-administered medications for this encounter.   Current Outpatient Medications  Medication Sig Dispense Refill   OLANZapine (ZYPREXA) 5 MG tablet Take 1 tablet (5 mg total) by mouth 2 (two) times daily.      PTA Medications:  PTA Medications  Medication Sig   OLANZapine (ZYPREXA) 5 MG tablet Take 1 tablet (5 mg total) by mouth 2 (two) times daily.   Facility Ordered Medications  Medication   [COMPLETED] OLANZapine (ZYPREXA) injection 10 mg        No data to display          Flowsheet Row ED from 08/12/2022 in [facility] Most recent reading at 08/13/2022  1:21 AM ED from 08/12/2022 in [facility] Department at [facility] Most recent reading at 08/12/2022  5:00 PM ED from 08/12/2022 in [facility] Most recent reading at 08/12/2022  1:20 PM  C-SSRS RISK CATEGORY No Risk No Risk No Risk       Musculoskeletal  Strength & Muscle Tone: within normal limits Gait & Station: normal Patient leans: N/A  Psychiatric Specialty Exam  Presentation  General Appearance:  Casual  Eye Contact: Fair  Speech: -- (will not speak)  Speech Volume: -- (will not speak)  Handedness: Right   Mood and Affect  Mood: Labile  Affect: Labile   Thought Process  Thought Processes: -- (unable to access)  Descriptions of Associations:-- (unable to access)  Orientation:Full (Time, Place and Person)  Thought Content:-- (unable to access)  Diagnosis of Schizophrenia or Schizoaffective disorder in past: No  Duration of Psychotic Symptoms: -- (unable to access)   Hallucinations:Hallucinations: -- (unable to access)  Ideas of Reference:-- (unable to access)  Suicidal Thoughts:Suicidal Thoughts: -- (unable to access)  Homicidal Thoughts:Homicidal Thoughts: -- (unable to access)   Sensorium  Memory: -- (unable to  access)  Judgment: -- (unable to access)  Insight: -- (unable to access)   Executive Functions  Concentration: -- (unable to access)  Attention Span: Poor  Recall: [employer] of Knowledge: Fair  Language: Fair   Psychomotor Activity  Psychomotor Activity:No data recorded  Assets  Assets: Communication Skills; Desire for Improvement   Sleep  Sleep:Sleep: -- (per nursing pt slept fair)   No data recorded  Physical Exam  Physical Exam Eyes:     General:        Right eye: No discharge.        Left eye: No discharge.  Cardiovascular:     Rate and Rhythm: Normal rate.  Pulmonary:     Effort: Pulmonary effort is normal. No respiratory distress.  Musculoskeletal:     Cervical back: Normal range of motion.  Neurological:     Mental Status: She is alert.     Comments: unable to access  Psychiatric:        Attention and Perception: She is inattentive.        Mood and Affect: Affect is labile.     Comments: Patient would not speak     Review of Systems  Reason unable to perform ROS: unable to access.   Blood pressure 102/66, pulse 72, temperature 98.1 F (36.7 C), temperature source Oral, resp. rate 17, SpO2 100%. There is no height or weight on file to calculate BMI.   Disposition: Patient continues to be inpatient psychiatric admission criteria.  Patient will be transferred to [facility] via law enforcement.  Patient remains under IVC.  Ardis Hughs, NP 08/17/2022, 3:13 PM

## 2022-08-17 NOTE — Discharge Instructions (Addendum)
  Pt was accepted to [facility] [place], [place] [address] Mailing address:[address] [place], [place] [address] TOMORROW 08/17/2022;PENDING Lillard Anes and IVC paperwork to be faxed to [phone].   Pt meets inpatient criteria per Julaine Fusi   Attending Physician will be Dr.Vincent Kathrine Cords, MD   Report can be called to: - [phone]   Pt can arrive after 7:00am

## 2022-08-17 NOTE — ED Notes (Signed)
Pt resting quietly, breathing is even and unlabored.  Pt denies SI, HI, pain and AVH.  Will continue to monitor for safety.  

## 2023-02-09 ENCOUNTER — Ambulatory Visit (HOSPITAL_COMMUNITY)
Admission: EM | Admit: 2023-02-09 | Discharge: 2023-02-10 | Payer: MEDICAID | Attending: Psychiatry | Admitting: Psychiatry

## 2023-02-09 DIAGNOSIS — F209 Schizophrenia, unspecified: Secondary | ICD-10-CM | POA: Diagnosis not present

## 2023-02-09 LAB — CBC WITH DIFFERENTIAL/PLATELET
Abs Immature Granulocytes: 0.01 10*3/uL (ref 0.00–0.07)
Basophils Absolute: 0 10*3/uL (ref 0.0–0.1)
Basophils Relative: 1 %
Eosinophils Absolute: 0 10*3/uL (ref 0.0–0.5)
Eosinophils Relative: 0 %
HCT: 38.5 % (ref 36.0–46.0)
Hemoglobin: 13.4 g/dL (ref 12.0–15.0)
Immature Granulocytes: 0 %
Lymphocytes Relative: 28 %
Lymphs Abs: 1.4 10*3/uL (ref 0.7–4.0)
MCH: 32 pg (ref 26.0–34.0)
MCHC: 34.8 g/dL (ref 30.0–36.0)
MCV: 91.9 fL (ref 80.0–100.0)
Monocytes Absolute: 0.3 10*3/uL (ref 0.1–1.0)
Monocytes Relative: 5 %
Neutro Abs: 3.3 10*3/uL (ref 1.7–7.7)
Neutrophils Relative %: 66 %
Platelets: 307 10*3/uL (ref 150–400)
RBC: 4.19 MIL/uL (ref 3.87–5.11)
RDW: 11.8 % (ref 11.5–15.5)
WBC: 5.1 10*3/uL (ref 4.0–10.5)
nRBC: 0 % (ref 0.0–0.2)

## 2023-02-09 LAB — COMPREHENSIVE METABOLIC PANEL
ALT: 17 U/L (ref 0–44)
AST: 18 U/L (ref 15–41)
Albumin: 4 g/dL (ref 3.5–5.0)
Alkaline Phosphatase: 41 U/L (ref 38–126)
Anion gap: 9 (ref 5–15)
BUN: 7 mg/dL (ref 6–20)
CO2: 25 mmol/L (ref 22–32)
Calcium: 9.1 mg/dL (ref 8.9–10.3)
Chloride: 105 mmol/L (ref 98–111)
Creatinine, Ser: 0.74 mg/dL (ref 0.44–1.00)
GFR, Estimated: >60 mL/min (ref >60)
Glucose, Bld: 87 mg/dL (ref 70–99)
Potassium: 3.9 mmol/L (ref 3.5–5.1)
Sodium: 139 mmol/L (ref 135–145)
Total Bilirubin: 0.7 mg/dL (ref 0.0–1.2)
Total Protein: 6.6 g/dL (ref 6.5–8.1)

## 2023-02-09 LAB — TSH: TSH: 1.702 u[IU]/mL (ref 0.350–4.500)

## 2023-02-09 LAB — HEMOGLOBIN A1C
Hgb A1c MFr Bld: 4.7 % — ABNORMAL LOW (ref 4.8–5.6)
Mean Plasma Glucose: 88.19 mg/dL

## 2023-02-09 LAB — LIPID PANEL
Cholesterol: 221 mg/dL — ABNORMAL HIGH (ref 0–200)
HDL: 69 mg/dL (ref >40)
LDL Cholesterol: 142 mg/dL — ABNORMAL HIGH (ref 0–99)
Total CHOL/HDL Ratio: 3.2 {ratio}
Triglycerides: 49 mg/dL (ref <150)
VLDL: 10 mg/dL (ref 0–40)

## 2023-02-09 MED ORDER — DIPHENHYDRAMINE HCL 50 MG/ML IJ SOLN: 50.0000 mg | Freq: Three times a day (TID) | INTRAMUSCULAR | Status: DC | PRN

## 2023-02-09 MED ORDER — HYDROXYZINE HCL 25 MG PO TABS: 25.0000 mg | ORAL_TABLET | Freq: Three times a day (TID) | ORAL | Status: DC | PRN

## 2023-02-09 MED ORDER — ACETAMINOPHEN 325 MG PO TABS: 650.0000 mg | ORAL_TABLET | Freq: Four times a day (QID) | ORAL | Status: DC | PRN

## 2023-02-09 MED ORDER — OLANZAPINE 5 MG PO TBDP: 5.0000 mg | ORAL_TABLET | Freq: Three times a day (TID) | ORAL | Status: DC | PRN

## 2023-02-09 MED ORDER — TRAZODONE HCL 50 MG PO TABS: 50.0000 mg | ORAL_TABLET | Freq: Every evening | ORAL | Status: DC | PRN

## 2023-02-09 MED ORDER — HALOPERIDOL LACTATE 5 MG/ML IJ SOLN: 5.0000 mg | Freq: Three times a day (TID) | INTRAMUSCULAR | Status: DC | PRN

## 2023-02-09 MED ORDER — MAGNESIUM HYDROXIDE 400 MG/5ML PO SUSP: 30.0000 mL | Freq: Every day | ORAL | Status: DC | PRN

## 2023-02-09 MED ORDER — LORAZEPAM 2 MG/ML IJ SOLN: 2.0000 mg | Freq: Three times a day (TID) | INTRAMUSCULAR | Status: DC | PRN

## 2023-02-09 MED ORDER — HALOPERIDOL LACTATE 5 MG/ML IJ SOLN: 10.0000 mg | Freq: Three times a day (TID) | INTRAMUSCULAR | Status: DC | PRN

## 2023-02-09 MED ORDER — ALUM & MAG HYDROXIDE-SIMETH 200-200-20 MG/5ML PO SUSP: 30.0000 mL | ORAL | Status: DC | PRN

## 2023-02-09 NOTE — Progress Notes (Signed)
   02/09/23 1142  [facility] Triage Screening (Walk-ins at [facility] only)  How Did You Hear About [place]? Family/Friend  What Is the Reason for Your Visit/Call Today? Tatsch is a 28 year old female presenting [facility] accompanied by her friend and mother. Pts mother reports that she needs to get her daugthers symptoms treated. Pts mother mentions her daugther has been talking to her self. Pts behavior has been worsening since April 2024. Pt is not currently taking medication at this time. Pt states, "I am burning and need some assistance today". Pt is looking to be admitted for inpatient treatment. Pt denies substance use, Si, Hi and Avh at this time.  How Long Has This Been Causing You Problems? > than 6 months  Have You Recently Had Any Thoughts About Hurting Yourself? No  Are You Planning to Commit Suicide/Harm Yourself At This time? No  Have you Recently Had Thoughts About Hurting Someone Karolee Ohs? No  Are You Planning To Harm Someone At This Time? No  Physical Abuse Denies  Verbal Abuse Denies  Sexual Abuse Denies  Exploitation of patient/patient's resources Denies  Self-Neglect Denies  Possible abuse reported to: Other (Comment)  Are you currently experiencing any auditory, visual or other hallucinations? No  Have You Used Any Alcohol or Drugs in the Past 24 Hours? No  Do you have any current medical co-morbidities that require immediate attention? No  Clinician description of patient physical appearance/behavior: anxious, jittery,  What Do You Feel Would Help You the Most Today? Medication(s);Social Support;Stress Management  If access to [facility] was not available, would you have sought care in the Emergency Department? No  Determination of Need Urgent (48 hours)  Options For Referral Medication Management  Determination of Need filed? Yes

## 2023-02-09 NOTE — ED Notes (Signed)
Patient is currently resting with eyes closed. No distress noted, no signs of pain/discomfort. No behavioral issues observed or reported. Staff will continue to monitor safety and changed in condition.

## 2023-02-09 NOTE — Progress Notes (Signed)
Pt has been accepted to [facility] on 02/10/2023 Bed assignment: EM[facility] C Building TOMORROW after 10 AM   Pt meets inpatient criteria per: Estill Cotta NP   Attending Physician will be: Dr Forrestine Him MD   Report can be called to: [phone]  Pt can arrive after 10 AM 02/10/2023   Care Team Notified: [facility] Rona Ravens RN, Estill Cotta NP, Staci Acosta LCSW-A    [place] Virdie Penning LCSW-A   02/09/2023 3:52 PM

## 2023-02-09 NOTE — ED Notes (Signed)
Patient admitted to [facility] for medication stabilization and evaluation.  Patient increasingly psychotic due to not adhering to medication regimen.  Patient was calm and cooperative with admission however oddly related with disorganized thought.  Patient came onto unit and went to sleep.  She has blisters on both ankles.  She has been pleasant and without distress.  She was given dinner and fluid.  Will monitor.

## 2023-02-09 NOTE — Progress Notes (Signed)
LCSW Progress Note  [id number]   Christy [name]  02/09/2023  3:37 PM  Description:   Inpatient Psychiatric Referral  Patient was recommended inpatient per Armandina Stammer NP. There are no available beds at [facility], per [facility] Rona Ravens RN. Patient was referred to the following out of network facilities:   Destination  Service Provider Address Phone Fax  [facility] [place] [address] [phone]  [facility] [address] [place] [address] [phone]  [facility] [address], [place] [address] [phone]  [facility] [address] [place] [address] [phone]  [facility] [address] [place], [place] [address] [phone]  [facility] [address] [place] [address] [phone]  [facility] [address], [place] [address] [phone]  [facility] [address] [place] [address] [phone]  [facility] [address], [place] [address] [phone]  [facility] [address] [place] [address] [phone]  [facility] [address], [place] [address] [phone]  [facility] [address] [place] [address] [phone]  [facility] EFAX [address] [place], [place] [phone]  [facility] [address], [place] [address] [phone]  [facility] [address] [place] [address] [phone]      Situation ongoing, CSW to continue following and update chart as more information becomes available.      [place] Teyah Rossy MSW,  LCSW  02/09/2023 3:37 PM

## 2023-02-09 NOTE — BH Assessment (Signed)
Comprehensive Clinical Assessment (CCA) Note  02/09/2023 Christy [name] [id number]  Disposition: Per Armandina Stammer, NP inpatient treatment is recommended.   [facility] to review.   Disposition SW to pursue appropriate inpatient options.  The patient demonstrates the following risk factors for suicide: Chronic risk factors for suicide include: psychiatric disorder of Psychosis Unspecified and demographic factors (female, >27 y/o). Acute risk factors for suicide include: family or marital conflict, unemployment, social withdrawal/isolation, and loss (financial, interpersonal, professional). Protective factors for this patient include: positive social support, responsibility to others (children, family), and hope for the future. Considering these factors, the overall suicide risk at this point appears to be low. Patient is appropriate for outpatient follow up, once stabilized.   Patient is a 28 year old female with a history of Unspecified Psychosis who presents voluntarily to [facility] for assessment.  Patient presents accompanied by her friend and mother. Patient prefers that her mother stay in the room for the assessment.  She reports she is here due to "burning" she feels today.  Patient states she also feels unappreciated, especially " when I'm not talking to the right person."  Patient is quite disorganized, with her responses. She is also disoriented, with questionable orientation to person even, stating she would like to be referred to as "Christy [name]."  Patient states she is willing to "do therapy" however she is not seeking inpatient admission at this time. Patient again struggles to engage in the assessment due to disorganized thought process and word salad responses.   Patient recalls being admitted to [facility] last year, however she is unable to discuss reasons she discontinued medicines following inpatient treatment.  She eludes to concerns about individuals "I am with seeing and I don't like  to say that I need meds.  I don't want to be called crazy. "  Patient appears to be experiencing paranoia and delusional thoughts.  At one point, she accused her mother, who pays her phone bill, of monitoring and listening in on her phone calls.  She vacillates between aligining with her mother and wondering if " you even love me?"  Patient has been unable to obtain employment, due to her mental health concerns.  Her mother has been assisting with paying her bills.  Patient denies SI, HI and AVH. She admits to smoking THC, however she does not provide details about use patterns.    Patient's mother has been concerned about patient "since last April."  She confirms patient was admitted and she does not believe patient continued medications prescribed during her admission.  Patient's mother had to take custody of patient's 52 y.o. daughter following the last episode in 04/2022.  Patient's mother shares that per court order, she is unable to allow patient to live with her while she had patient's child in her care.  Patient is also "court ordered" to engage in therapy for 9 months before the court would consider next steps with visitation.  Patient has not been open to therapy.  Today, mother left work and was able to convince patient to present for assessment.  She is hopeful that patient will be admitted again for stabilization.    Chief Complaint:  Chief Complaint  Patient presents with   Schizophrenia   Paranoid   Visit Diagnosis: Unspecified Psychosis    CCA Screening, Triage and Referral (STR)  Patient Reported Information How did you hear about [place]? Family/Friend  What Is the Reason for Your Visit/Call Today? Catino is a 28 year old female  presenting to [facility] accompanied by her friend and mother. Pts mother reports that she needs to get her daugthers symptoms treated. Pts mother mentions her daugther has been talking to her self. Pts behavior has been worsening since April 2024. Pt is not currently  taking medication at this time. Pt states, "I am burning and need some assistance today". Pt is looking to be admitted for inpatient treatment. Pt denies substance use, Si, Hi and Avh at this time.  How Long Has This Been Causing You Problems? > than 6 months  What Do You Feel Would Help You the Most Today? Medication(s); Social Support; Stress Management   Have You Recently Had Any Thoughts About Hurting Yourself? No  Are You Planning to Commit Suicide/Harm Yourself At This time? No   Flowsheet Row ED from 02/09/2023 in [facility] Most recent reading at 02/09/2023 11:54 AM ED from 08/12/2022 in [facility] Most recent reading at 08/13/2022  1:21 AM ED from 08/12/2022 in [facility] Department [facility] Most recent reading at 08/12/2022  5:00 PM  C-SSRS RISK CATEGORY No Risk No Risk No Risk       Have you Recently Had Thoughts About Hurting Someone Karolee Ohs? No  Are You Planning to Harm Someone at This Time? No  Explanation: N/A   Have You Used Any Alcohol or Drugs in the Past 24 Hours? No  How Long Ago Did You Use Drugs or Alcohol? No data recorded What Did You Use and How Much? No data recorded  Do You Currently Have a Therapist/Psychiatrist? No  Name of Therapist/Psychiatrist:    Have You Been Recently Discharged From Any Office Practice or Programs? No  Explanation of Discharge From Practice/Program: na     CCA Screening Triage Referral Assessment Type of Contact: Face-to-Face  Telemedicine Service Delivery:   Is this Initial or Reassessment?   Date Telepsych consult ordered in [facility]:    Time Telepsych consult ordered in [facility]:    Location of Assessment: [facility] Services  Provider Location: [facility]   Collateral Involvement: Patient's mother, Zenaida Niece is present and provides collateral.   Does Patient Have a Automotive engineer Guardian? No  Legal Guardian  Contact Information: N/A  Copy of Legal Guardianship Form: -- (N/A)  Legal Guardian Notified of Arrival: -- (N/A)  Legal Guardian Notified of Pending Discharge: -- (N/A)  If Minor and Not Living with Parent(s), Who has Custody? N/A  Is CPS involved or ever been involved? -- (none reported)  Is APS involved or ever been involved? Never   Patient Determined To Be At Risk for Harm To Self or Others Based on Review of Patient Reported Information or Presenting Complaint? No  Method: -- (N/A, no HI)  Availability of Means: -- (N/A, no HI)  Intent: -- (N/A, no HI)  Notification Required: -- (N/A, no HI)  Additional Information for Danger to Others Potential: Active psychosis (\)  Additional Comments for Danger to Others Potential: N/A, no HI  Are There Guns or Other Weapons in Your Home? No  Types of Guns/Weapons: N/A  Are These Weapons Safely Secured?                            -- (N/A)  Who Could Verify You Are Able To Have These Secured: N/A  Do You Have any Outstanding Charges, Pending Court Dates, Parole/Probation? denies  Contacted To Inform  of Risk of Harm To Self or Others: Family/Significant Other:    Does Patient Present under Involuntary Commitment? No    [place] of Residence: [place]   Patient Currently Receiving the Following Services: Not Receiving Services   Determination of Need: Urgent (48 hours)   Options For Referral: Inpatient Hospitalization; [facility]; Medication Management; Outpatient Therapy     CCA Biopsychosocial Patient Reported Schizophrenia/Schizoaffective Diagnosis in Past: No   Strengths: strong family support   Mental Health Symptoms Depression:  -- (UTA due to patient's current psychotic sx)   Duration of Depressive symptoms:    Mania:  -- (UTA due to patient's current psychotic sx)   Anxiety:   -- (UTA due to patient's current psychotic sx)   Psychosis:  Delusions; Hallucinations; Grossly disorganized or  catatonic behavior; Grossly disorganized speech   Duration of Psychotic symptoms: Duration of Psychotic Symptoms: Greater than six months   Trauma:  None (Pt was unable to respond to questions due to psychotic symptoms.)   Obsessions:  None (Pt was unable to respond to questions due to psychotic symptoms.)   Compulsions:  None (Pt was unable to respond to questions due to psychotic symptoms.)   Inattention:  N/A   Hyperactivity/Impulsivity:  N/A   Oppositional/Defiant Behaviors:  N/A   Emotional Irregularity:  Intense/unstable relationships; Mood lability (Pt was unable to respond to questions due to psychotic symptoms.)   Other Mood/Personality Symptoms:  none observed    Mental Status Exam Appearance and self-care  Stature:  Average   Weight:  Average weight   Clothing:  Casual   Grooming:  Normal   Cosmetic use:  Age appropriate   Posture/gait:  Normal   Motor activity:  Not Remarkable   Sensorium  Attention:  Confused; Distractible; Inattentive; Vigilant   Concentration:  Anxiety interferes; Focuses on irrelevancies; Preoccupied   Orientation:  Person (Pt was unable to respond to questions due to psychotic symptoms.)   Recall/memory:  -- (UTA due to patient's current psychotic sx)   Affect and Mood  Affect:  Anxious; Labile   Mood:  Hypomania; Irritable   Relating  Eye contact:  Fleeting; Avoided   Facial expression:  Tense   Attitude toward examiner:  Defensive; Guarded; Suspicious   Thought and Language  Speech flow: Flight of Ideas; Pressured   Thought content:  Delusions   Preoccupation:  None (Pt was unable to respond to questions due to psychotic symptoms.)   Hallucinations:  None (Pt was unable to respond to questions due to psychotic symptoms.)   Organization:  Disorganized; Loose   Company secretary of Knowledge:  Average (Pt was unable to respond to questions due to psychotic symptoms.)   Intelligence:  Average (Pt was  unable to respond to questions due to psychotic symptoms.)   Abstraction:  Functional (Pt was unable to respond to questions due to psychotic symptoms.)   Judgement:  Impaired   Reality Testing:  Distorted   Insight:  Lacking   Decision Making:  Confused   Social Functioning  Social Maturity:  Impulsive; Irresponsible (Pt was unable to respond to questions due to psychotic symptoms.)   Social Judgement:  Impropriety (Pt was unable to respond to questions due to psychotic symptoms.)   Stress  Stressors:  Relationship; Financial (Pt was unable to respond to questions due to psychotic symptoms.)   Coping Ability:  Overwhelmed   Skill Deficits:  Communication; Decision making; Responsibility; Self-care (Pt was unable to respond to questions due to psychotic symptoms.)  Supports:  Family     Religion: Religion/Spirituality Are You A Religious Person?: No (Pt was unable to respond to questions due to psychotic symptoms.) How Might This Affect Treatment?: N/A  Leisure/Recreation: Leisure / Recreation Do You Have Hobbies?: Yes (per chart) Leisure and Hobbies: " doing hair "  Exercise/Diet: Exercise/Diet Do You Exercise?: No (Pt was unable to respond to questions due to psychotic symptoms.) Have You Gained or Lost A Significant Amount of Weight in the Past Six Months?: No (Pt was unable to respond to questions due to psychotic symptoms.) Do You Follow a Special Diet?: No (Pt was unable to respond to questions due to psychotic symptoms.) Do You Have Any Trouble Sleeping?:  (Pt was unable to respond to questions due to psychotic sx.)   CCA Employment/Education Employment/Work Situation: Employment / Work Situation Employment Situation: Unemployed (per chart) Patient's Job has Been Impacted by Current Illness: No Has Patient ever Been in Equities trader?: No  Education: Education Is Patient Currently Attending School?: No Last Grade Completed:  (Pt was unable to respond to  questions due to psychotic sx.) Did You Attend College?: No (Pt was unable to respond to questions due to psychotic symptoms.) Did You Have An Individualized Education Program (IIEP):  (Pt was unable to respond to questions due to psychotic symptoms.) Did You Have Any Difficulty At School?:  (Pt was unable to respond to questions due to psychotic symptoms.) Patient's Education Has Been Impacted by Current Illness: No   CCA Family/Childhood History Family and Relationship History: Family history Marital status: Single Does patient have children?: Yes How many children?: 1 How is patient's relationship with their children?: Patient has a 70 y.o. daughter that currently in the care of mother, due to patient's mental health concerns.  Childhood History:  Childhood History By whom was/is the patient raised?: Mother, Grandparents (per chart) Did patient suffer any verbal/emotional/physical/sexual abuse as a child?: No Did patient suffer from severe childhood neglect?: No Has patient ever been sexually abused/assaulted/raped as an adolescent or adult?: No Was the patient ever a victim of a crime or a disaster?: No Witnessed domestic violence?: No Has patient been affected by domestic violence as an adult?: No       CCA Substance Use Alcohol/Drug Use: Alcohol / Drug Use Pain Medications: see MAR Prescriptions: see MAR Over the Counter: see MAR History of alcohol / drug use?: No history of alcohol / drug abuse Longest period of sobriety (when/how long): Patient admits to [facility] use, does not specify use patterns.                         ASAM's:  Six Dimensions of Multidimensional Assessment  Dimension 1:  Acute Intoxication and/or Withdrawal Potential:      Dimension 2:  Biomedical Conditions and Complications:      Dimension 3:  Emotional, Behavioral, or Cognitive Conditions and Complications:     Dimension 4:  Readiness to Change:     Dimension 5:  Relapse, Continued  use, or Continued Problem Potential:     Dimension 6:  Recovery/Living Environment:     ASAM Severity Score:    ASAM Recommended Level of Treatment:     Substance use Disorder (SUD)    Recommendations for Services/Supports/Treatments:    Disposition Recommendation per psychiatric provider: We recommend inpatient psychiatric hospitalization when medically cleared. Patient is under voluntary admission status at this time; please IVC if attempts to leave hospital.   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

## 2023-02-09 NOTE — ED Provider Notes (Signed)
[facility] Continuous Assessment Admission H&P  Date: 02/09/23  Patient Name: Christy [name]  MRN: [id number]  Chief Complaint: Worsening psychosis, delusional thoughts & paranoia.  Diagnoses:  Final diagnoses:  None   HPI: Christy [name] is a 28 year old AA female with hx of Schizophrenia. Patient came to the [facility] accompanies by her mother [name] with complain of worsening psychosis, delusional thoughts & paranoid ideations. Patient was admitted to the [facility] in April of 24 with similar complaints. After stabilization & discharge at that time, she was recommended for an outpatient psychiatric follow-up for routine psychiatric care/medication management. Form observation of her outlook/presentation, patient may not have been compliant with her recommended treatment regimen. She presents today highly psychotic, delusional with paranoid ideations. She is a good candidate for an inpatient psychiatric admission for mood stabilization treatments. Patient is currently disorganized & tangential. She is verbally responsive, however, all her statements were scattered & did not make any sense. She is a bit hesitant to inpatient admission, as a result, she will be involuntarily committed for mood stabilization treatments.  Total Time spent with patient: 1 hour  Musculoskeletal  Strength & Muscle Tone: within normal limits Gait & Station: normal Patient leans: N/A  Psychiatric Specialty Exam  Presentation General Appearance:  Casual  Eye Contact: Fair  Speech: -- (will not speak)  Speech Volume: -- (will not speak)  Handedness: Right   Mood and Affect  Mood: Labile  Affect: Labile   Thought Process  Thought Processes: -- (unable to access)  Descriptions of Associations:-- (unable to access)  Orientation:Full (Time, Place and Person)  Thought Content:-- (unable to access)  Diagnosis of Schizophrenia or Schizoaffective disorder in past: No  Duration of Psychotic  Symptoms: -- (unable to access)  Hallucinations:No data recorded Ideas of Reference:-- (unable to access)  Suicidal Thoughts:No data recorded Homicidal Thoughts:No data recorded  Sensorium  Memory: -- (unable to access)  Judgment: -- (unable to access)  Insight: -- (unable to access)   Executive Functions  Concentration: -- (unable to access)  Attention Span: Poor  Recall: [employer] of Knowledge: Fair  Language: Fair   Psychomotor Activity  Psychomotor Activity:No data recorded  Assets  Assets: Communication Skills; Desire for Improvement   Sleep  Sleep:No data recorded  No data recorded  Physical Exam Vitals and nursing note reviewed.  Cardiovascular:     Pulses: Normal pulses.     Comments: Diastolic blood pressure elevated: 131/98 Pulmonary:     Effort: Pulmonary effort is normal.  Genitourinary:    Comments: Deferred Musculoskeletal:        General: Normal range of motion.     Cervical back: Normal range of motion.  Skin:    General: Skin is warm and dry.  Neurological:     General: No focal deficit present.     Mental Status: She is alert and oriented to person, place, and time.    Review of Systems  Constitutional:  Negative for chills, diaphoresis and fever.  HENT:  Negative for congestion and sore throat.   Respiratory:  Negative for cough, shortness of breath and wheezing.   Cardiovascular:  Negative for chest pain and palpitations.  Gastrointestinal:  Negative for abdominal pain, constipation, diarrhea, heartburn, nausea and vomiting.  Musculoskeletal:  Negative for joint pain and myalgias.  Neurological:  Negative for dizziness, tingling, tremors, sensory change, speech change, focal weakness, seizures, loss of consciousness, weakness and headaches.  Endo/Heme/Allergies:        Allergies: PCN.  Psychiatric/Behavioral:  Positive for hallucinations. Negative for memory loss and suicidal ideas. The patient is nervous/anxious and  has insomnia.     Blood pressure (!) 131/98, pulse 100, resp. rate 20, SpO2 100%. There is no height or weight on file to calculate BMI.  Past Psychiatric History: Schizophrenia, inpatient hospitalization, [facility] 2024.   Is the patient at risk to self? No  Has the patient been a risk to self in the past 6 months?  Unsure .    Has the patient been a risk to self within the distant past?  Unsure   Is the patient a risk to others?  Unsure.   Has the patient been a risk to others in the past 6 months?  Unsure   Has the patient been a risk to others within the distant past?  Unsusre.  Past Medical History: Unable to assess.  Family History:  Maternal grandmother smokes marijuana and cocaine, Maternal aunt has alcoholism, Paternal aunt has hx of cocaine use.   Social History: Patient is single, has one daughter, lives with mother.  Last Labs:  Admission on 08/12/2022, Discharged on 08/17/2022  Component Date Value Ref Range Status   POC Amphetamine UR 08/15/2022 None Detected  NONE DETECTED (Cut Off Level 1000 ng/mL) Final   POC Secobarbital (BAR) 08/15/2022 None Detected  NONE DETECTED (Cut Off Level 300 ng/mL) Final   POC Buprenorphine (BUP) 08/15/2022 None Detected  NONE DETECTED (Cut Off Level 10 ng/mL) Final   POC Oxazepam (BZO) 08/15/2022 None Detected  NONE DETECTED (Cut Off Level 300 ng/mL) Final   POC Cocaine UR 08/15/2022 None Detected  NONE DETECTED (Cut Off Level 300 ng/mL) Final   POC Methamphetamine UR 08/15/2022 None Detected  NONE DETECTED (Cut Off Level 1000 ng/mL) Final   POC Morphine 08/15/2022 None Detected  NONE DETECTED (Cut Off Level 300 ng/mL) Final   POC Methadone UR 08/15/2022 None Detected  NONE DETECTED (Cut Off Level 300 ng/mL) Final   POC Oxycodone UR 08/15/2022 None Detected  NONE DETECTED (Cut Off Level 100 ng/mL) Final   POC Marijuana UR 08/15/2022 Positive (A)  NONE DETECTED (Cut Off Level 50 ng/mL) Final   Color, Urine 08/15/2022 AMBER (A)  YELLOW Final    BIOCHEMICALS MAY BE AFFECTED BY COLOR   APPearance 08/15/2022 HAZY (A)  CLEAR Final   Specific Gravity, Urine 08/15/2022 1.030  1.005 - 1.030 Final   pH 08/15/2022 5.0  5.0 - 8.0 Final   Glucose, UA 08/15/2022 NEGATIVE  NEGATIVE mg/dL Final   Hgb urine dipstick 08/15/2022 NEGATIVE  NEGATIVE Final   Bilirubin Urine 08/15/2022 NEGATIVE  NEGATIVE Final   Ketones, ur 08/15/2022 5 (A)  NEGATIVE mg/dL Final   Protein, ur 40/98/1191 NEGATIVE  NEGATIVE mg/dL Final   Nitrite 47/82/9562 NEGATIVE  NEGATIVE Final   Leukocytes,Ua 08/15/2022 SMALL (A)  NEGATIVE Final   RBC / HPF 08/15/2022 11-20  0 - 5 RBC/hpf Final   WBC, UA 08/15/2022 11-20  0 - 5 WBC/hpf Final   Bacteria, UA 08/15/2022 RARE (A)  NONE SEEN Final   Squamous Epithelial / HPF 08/15/2022 6-10  0 - 5 /HPF Final   Mucus 08/15/2022 PRESENT   Final   Performed at [facility], [address] [place], [place] [address]   SARS Coronavirus 2 by RT PCR 08/15/2022 NEGATIVE  NEGATIVE Final   Performed at [facility], [address], [place], [place] [address]  Admission on 08/12/2022, Discharged on 08/12/2022  Component Date Value Ref  Range Status   WBC 08/12/2022 6.8  4.0 - 10.5 K/uL Final   RBC 08/12/2022 4.31  3.87 - 5.11 MIL/uL Final   Hemoglobin 08/12/2022 13.9  12.0 - 15.0 g/dL Final   HCT 16/10/9602 40.8  36.0 - 46.0 % Final   MCV 08/12/2022 94.7  80.0 - 100.0 fL Final   MCH 08/12/2022 32.3  26.0 - 34.0 pg Final   MCHC 08/12/2022 34.1  30.0 - 36.0 g/dL Final   RDW 54/09/8117 11.5  11.5 - 15.5 % Final   Platelets 08/12/2022 284  150 - 400 K/uL Final   nRBC 08/12/2022 0.0  0.0 - 0.2 % Final   Neutrophils Relative % 08/12/2022 75  % Final   Neutro Abs 08/12/2022 5.0  1.7 - 7.7 K/uL Final   Lymphocytes Relative 08/12/2022 22  % Final   Lymphs Abs 08/12/2022 1.5  0.7 - 4.0 K/uL Final   Monocytes Relative 08/12/2022 3  % Final   Monocytes Absolute 08/12/2022 0.2  0.1 - 1.0 K/uL Final   Eosinophils Relative 08/12/2022 0  %  Final   Eosinophils Absolute 08/12/2022 0.0  0.0 - 0.5 K/uL Final   Basophils Relative 08/12/2022 0  % Final   Basophils Absolute 08/12/2022 0.0  0.0 - 0.1 K/uL Final   Immature Granulocytes 08/12/2022 0  % Final   Abs Immature Granulocytes 08/12/2022 0.01  0.00 - 0.07 K/uL Final   Performed at [facility], [address] [place], [place] [address]   Sodium 08/12/2022 135  135 - 145 mmol/L Final   Potassium 08/12/2022 3.8  3.5 - 5.1 mmol/L Final   Chloride 08/12/2022 105  98 - 111 mmol/L Final   CO2 08/12/2022 21 (L)  22 - 32 mmol/L Final   Glucose, Bld 08/12/2022 80  70 - 99 mg/dL Final   Glucose reference range applies only to samples taken after fasting for at least 8 hours.   BUN 08/12/2022 12  6 - 20 mg/dL Final   Creatinine, Ser 08/12/2022 0.76  0.44 - 1.00 mg/dL Final   Calcium 95/62/1308 9.0  8.9 - 10.3 mg/dL Final   Total Protein 65/78/4696 7.2  6.5 - 8.1 g/dL Final   Albumin 29/52/8413 4.3  3.5 - 5.0 g/dL Final   AST 24/40/1027 15  15 - 41 U/L Final   ALT 08/12/2022 13  0 - 44 U/L Final   Alkaline Phosphatase 08/12/2022 39  38 - 126 U/L Final   Total Bilirubin 08/12/2022 0.9  0.3 - 1.2 mg/dL Final   GFR, Estimated 08/12/2022 >60  >60 mL/min Final   Comment: (NOTE) Calculated using the CKD-EPI Creatinine Equation (2021)    Anion gap 08/12/2022 9  5 - 15 Final   Performed at [facility], [address] [place], [place] [address]   Alcohol, Ethyl (B) 08/12/2022 <10  <10 mg/dL Final   Comment: (NOTE) Lowest detectable limit for serum alcohol is 10 mg/dL.  For medical purposes only. Performed at [facility], [address] [place], [place] [address]   Admission on 08/12/2022, Discharged on 08/12/2022  Component Date Value Ref Range Status   Sodium 08/12/2022 140  135 - 145 mmol/L Final   Potassium 08/12/2022 3.4 (L)  3.5 - 5.1 mmol/L Final   Chloride 08/12/2022 102  98 - 111 mmol/L Final   CO2 08/12/2022 24  22 - 32 mmol/L Final   Glucose, Bld  08/12/2022 160 (H)  70 - 99 mg/dL Final   Glucose  reference range applies only to samples taken after fasting for at least 8 hours.   BUN 08/12/2022 19  6 - 20 mg/dL Final   Creatinine, Ser 08/12/2022 0.96  0.44 - 1.00 mg/dL Final   Calcium 95/28/4132 8.5 (L)  8.9 - 10.3 mg/dL Final   Total Protein 44/01/270 6.4 (L)  6.5 - 8.1 g/dL Final   Albumin 53/66/4403 3.6  3.5 - 5.0 g/dL Final   AST 47/42/5956 78 (H)  15 - 41 U/L Final   ALT 08/12/2022 57 (H)  0 - 44 U/L Final   Alkaline Phosphatase 08/12/2022 68  38 - 126 U/L Final   Total Bilirubin 08/12/2022 0.5  0.3 - 1.2 mg/dL Final   GFR, Estimated 08/12/2022 >60  >60 mL/min Final   Comment: (NOTE) Calculated using the CKD-EPI Creatinine Equation (2021)    Anion gap 08/12/2022 14  5 - 15 Final   Performed at [facility], [address] [place], [place] [address]   Magnesium 08/12/2022 2.1  1.7 - 2.4 mg/dL Final   Performed at [facility], [address] [place], [place] [address]    Allergies: Penicillins  Medications:   Medical Decision Making   Recommendations  Based on my evaluation the patient does not appear to have an emergency medical condition. However, she is a good candidate for inpatient psychiatric hospitalization due to worsening psychosis.  Armandina Stammer, NP, pmhnp, fnp-bc. 02/09/23  1:22 PM

## 2023-02-10 DIAGNOSIS — F209 Schizophrenia, unspecified: Secondary | ICD-10-CM

## 2023-02-10 LAB — URINALYSIS, ROUTINE W REFLEX MICROSCOPIC
Bilirubin Urine: NEGATIVE
Glucose, UA: NEGATIVE mg/dL
Hgb urine dipstick: NEGATIVE
Ketones, ur: 5 mg/dL — AB
Leukocytes,Ua: NEGATIVE
Nitrite: NEGATIVE
Protein, ur: NEGATIVE mg/dL
Specific Gravity, Urine: 1.025 (ref 1.005–1.030)
pH: 6 (ref 5.0–8.0)

## 2023-02-10 LAB — POCT PREGNANCY, URINE: Preg Test, Ur: NEGATIVE

## 2023-02-10 MED ORDER — ARIPIPRAZOLE 5 MG PO TABS: 5.0000 mg | ORAL_TABLET | Freq: Every day | ORAL | Status: AC

## 2023-02-10 MED ORDER — ARIPIPRAZOLE 5 MG PO TABS
5.0000 mg | ORAL_TABLET | Freq: Every day | ORAL | Status: DC
Start: 2023-02-10 — End: 2023-02-10
  Administered 2023-02-10: 5 mg via ORAL
  Filled 2023-02-10: qty 1

## 2023-02-10 MED ORDER — OLANZAPINE 10 MG PO TBDP: 10.0000 mg | ORAL_TABLET | Freq: Every day | ORAL | Status: DC

## 2023-02-10 NOTE — ED Notes (Signed)
Pt is currently sleeping, no distress noted, environmental check complete, will continue to monitor patient for safety.

## 2023-02-10 NOTE — ED Notes (Signed)
Report called to [facility] LPN @ [place]. Will continue to monitor for safety and report any COC. Sheriff called for transport. Will continue to monitor and report any COC.

## 2023-02-10 NOTE — ED Notes (Signed)
Patient discharged to [facility]. Patient stable and ambulatory at time of discharge, voices understanding of admission. Belongings returned complete and intact. Staff escorted patient to back sallyport for transport to facility. Safety maintained.

## 2023-02-10 NOTE — Discharge Instructions (Addendum)
Transfer to [facility]

## 2023-02-10 NOTE — ED Notes (Signed)
Pt resting quietly, breathing is even and unlabored.  Pt denies SI, HI, pain and AVH. Will continue to monitor for safety and report any COC.

## 2023-02-10 NOTE — ED Notes (Signed)
Patient resting in lounger with eyes closed, respirations even and unlabored. Patient in no apparent acute distress. Environment secured. Safety checks in place per facility protocol.

## 2023-02-10 NOTE — ED Provider Notes (Signed)
FBC/OBS ASAP Discharge Summary  Date and Time: 02/10/2023 10:23 AM  Name: Christy [name]  MRN:  [id number]   Discharge Diagnoses:  Final diagnoses:  Schizophrenia, unspecified type (HCC)    Subjective: Met patient this morning in [facility] observation room. Patient had been roomed there preemptively based on her previous hospitalization where she had been aggressive and agitated. Patient this morning was calm, polite, and agreeable to interview. She expressed anxiety about what has been happening with her, including self reported concerns of paranoia, reports of delusions that "the pregnancy tests are wrong, I feel like I am pregnant", that she is seeing disturbing things "I saw demons on my phone when I was out talking to this nice mother and daughter. I know there were no demons, but I saw them." Patient has not taken any medications in a long time but does not remember what has worked well or poorly for her. Patient's speech is disorganized and tangential. She is not completing essential tasks for daily living (daily showers), etc.  Patient is under IVC due to disorganized behavior  Stay Summary: Pt was brought to [facility] under IVC after concerns from family members that she has been acting disorganized. Patient has been pleasant and mostly cooperative. 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 Tobacco Cessation:  A prescription for an FDA-approved tobacco cessation medication provided at discharge  Current Medications:  Current Facility-Administered Medications  Medication Dose Route  Frequency Provider Last Rate Last Admin   acetaminophen (TYLENOL) tablet 650 mg  650 mg Oral Q6H PRN Armandina Stammer I, NP       alum & mag hydroxide-simeth (MAALOX/MYLANTA) 200-200-20 MG/5ML suspension 30 mL  30 mL Oral Q4H PRN Armandina Stammer I, NP       ARIPiprazole (ABILIFY) tablet 5 mg  5 mg Oral Daily Margaretmary Dys, MD       haloperidol lactate (HALDOL) injection 5 mg  5 mg Intramuscular TID PRN Armandina Stammer I, NP       And   diphenhydrAMINE (BENADRYL) injection 50 mg  50 mg Intramuscular TID PRN Armandina Stammer I, NP       And   LORazepam (ATIVAN) injection 2 mg  2 mg Intramuscular TID PRN Armandina Stammer I, NP       haloperidol lactate (HALDOL) injection 10 mg  10 mg Intramuscular TID PRN Armandina Stammer I, NP       And   diphenhydrAMINE (BENADRYL) injection 50 mg  50 mg Intramuscular TID PRN Armandina Stammer I, NP       And   LORazepam (ATIVAN) injection 2 mg  2 mg Intramuscular TID PRN Armandina Stammer I, NP       hydrOXYzine (ATARAX) tablet 25 mg  25 mg Oral TID PRN Armandina Stammer I, NP       magnesium hydroxide (MILK OF MAGNESIA) suspension 30 mL  30 mL Oral Daily PRN Nwoko, Agnes I, NP       OLANZapine zydis (ZYPREXA) disintegrating tablet 5 mg  5 mg Oral TID PRN Armandina Stammer I, NP       traZODone (DESYREL) tablet 50 mg  50 mg Oral QHS PRN Nwoko,  Nelda Marseille, NP       Current Outpatient Medications  Medication Sig Dispense Refill   ARIPiprazole (ABILIFY) 5 MG tablet Take 1 tablet (5 mg total) by mouth daily.      PTA Medications:  Facility Ordered Medications  Medication   acetaminophen (TYLENOL) tablet 650 mg   alum & mag hydroxide-simeth (MAALOX/MYLANTA) 200-200-20 MG/5ML suspension 30 mL   magnesium hydroxide (MILK OF MAGNESIA) suspension 30 mL   hydrOXYzine (ATARAX) tablet 25 mg   traZODone (DESYREL) tablet 50 mg   OLANZapine zydis (ZYPREXA) disintegrating tablet 5 mg   haloperidol lactate (HALDOL) injection 5 mg   And   diphenhydrAMINE (BENADRYL) injection 50 mg   And    LORazepam (ATIVAN) injection 2 mg   haloperidol lactate (HALDOL) injection 10 mg   And   diphenhydrAMINE (BENADRYL) injection 50 mg   And   LORazepam (ATIVAN) injection 2 mg   ARIPiprazole (ABILIFY) tablet 5 mg   PTA Medications  Medication Sig   ARIPiprazole (ABILIFY) 5 MG tablet Take 1 tablet (5 mg total) by mouth daily.        No data to display          Flowsheet Row ED from 02/09/2023 in [facility] Most recent reading at 02/09/2023  6:25 PM ED from 08/12/2022 in [facility] Most recent reading at 08/13/2022  1:21 AM ED from 08/12/2022 in [facility] Emergency Department at [facility] Most recent reading at 08/12/2022  5:00 PM  C-SSRS RISK CATEGORY No Risk No Risk No Risk       Musculoskeletal  Strength & Muscle Tone: within normal limits Gait & Station: normal Patient leans: N/A  Psychiatric Specialty Exam  Presentation  General Appearance:  Casual; Disheveled (malodorous)  Eye Contact: Fleeting  Speech: Clear and Coherent  Speech Volume: Decreased  Handedness: Right   Mood and Affect  Mood: Depressed  Affect: Inappropriate; Non-Congruent (Patient had inappropriate laughter and remarked on it "why am I doing that? it's not funny but I am laughing like that?")   Thought Process  Thought Processes: Disorganized  Descriptions of Associations:Tangential  Orientation:Partial  Thought Content:Delusions; Paranoid Ideation; Scattered  Diagnosis of Schizophrenia or Schizoaffective disorder in past: Yes  Duration of Psychotic Symptoms: Greater than six months   Hallucinations:Hallucinations: Other (comment); Tactile; Auditory (reports pseudocyesis)  Ideas of Reference:-- (unable to access)  Suicidal Thoughts:Suicidal Thoughts: No  Homicidal Thoughts:Homicidal Thoughts: No   Sensorium  Memory: Immediate Fair; Recent Fair; Remote  Fair  Judgment: Fair  Insight: Fair   Art therapist  Concentration: Fair  Attention Span: Poor  Recall: [employer] of Knowledge: Fair  Language: Fair   Psychomotor Activity  Psychomotor Activity: Psychomotor Activity: Normal (Pt reports that last time she was given haldol, she experienced muscle rigidity in her legs.)   Assets  Assets: Desire for Improvement; Communication Skills; Physical Health; Social Support   Sleep  Sleep: Sleep: Poor   Nutritional Assessment (For OBS and FBC admissions only) Has the patient had a weight loss or gain of 10 pounds or more in the last 3 months?: No Has the patient had a decrease in food intake/or appetite?: No Does the patient have dental problems?: No Does the patient have eating habits or behaviors that may be indicators of an eating disorder including binging or inducing vomiting?: No Has the patient recently lost weight without trying?: 0 Has the patient been eating poorly because of a decreased appetite?: 0 Malnutrition  Screening Tool Score: 0   EKG: normal EKG, normal sinus rhythm, unchanged from previous tracings. (QTC 459 ms on 1/29)  Physical Exam  Physical Exam Vitals and nursing note reviewed.  Constitutional:      General: She is not in acute distress.    Appearance: She is ill-appearing.  HENT:     Head: Normocephalic and atraumatic.     Nose: Nose normal.  Pulmonary:     Effort: Pulmonary effort is normal.  Neurological:     Mental Status: She is alert and oriented to person, place, and time.  Psychiatric:        Attention and Perception: Attention normal. She perceives visual hallucinations.        Mood and Affect: Mood is anxious and depressed. Affect is inappropriate.        Speech: Speech is tangential.        Behavior: Behavior is withdrawn. Behavior is cooperative.        Thought Content: Thought content is paranoid and delusional. Thought content does not include homicidal or  suicidal ideation.        Cognition and Memory: Cognition is impaired. Memory is impaired.        Judgment: Judgment is impulsive.    Review of Systems  Constitutional:  Negative for chills, fever, malaise/fatigue and weight loss.  Psychiatric/Behavioral:  Positive for hallucinations. Negative for depression, memory loss, substance abuse and suicidal ideas. The patient is nervous/anxious and has insomnia.    Blood pressure 127/76, pulse 93, temperature 99.1 F (37.3 C), temperature source Oral, resp. rate 18, SpO2 98%. There is no height or weight on file to calculate BMI.  Demographic Factors:  Adolescent or young adult, Low socioeconomic status, and Unemployed  Loss Factors: Legal issues  Historical Factors: Impulsivity  Risk Reduction Factors:   Sense of responsibility to family, Living with another person, especially a relative, and Positive social support  Continued Clinical Symptoms:  Schizophrenia:   Less than 79 years old Paranoid or undifferentiated type Currently Psychotic  Cognitive Features That Contribute To Risk:  Loss of executive function    Suicide Risk:  Moderate:  Frequent suicidal ideation with limited intensity, and duration, some specificity in terms of plans, no associated intent, good self-control, limited dysphoria/symptomatology, some risk factors present, and identifiable protective factors, including available and accessible social support.  Plan Of Care/Follow-up recommendations:  Activity:  As tolerated Diet:  regular Tests:  EKG obtained. Other:  recommend starting abilify 5 mg, titrate to effect. Patient expressed willingness to do an LAI.  Disposition: Transferred to [facility] under IVC  Margaretmary Dys, MD 02/10/2023, 10:23 AM

## 2023-02-20 ENCOUNTER — Other Ambulatory Visit: Payer: Self-pay

## 2023-02-20 ENCOUNTER — Emergency Department (HOSPITAL_COMMUNITY): Payer: MEDICAID

## 2023-02-20 ENCOUNTER — Emergency Department (HOSPITAL_COMMUNITY)
Admission: EM | Admit: 2023-02-20 | Discharge: 2023-02-20 | Disposition: A | Discharge status: Home / Self Care | Payer: MEDICAID | Attending: Emergency Medicine | Admitting: Emergency Medicine

## 2023-02-20 DIAGNOSIS — R42 Dizziness and giddiness: Secondary | ICD-10-CM | POA: Insufficient documentation

## 2023-02-20 DIAGNOSIS — R079 Chest pain, unspecified: Secondary | ICD-10-CM | POA: Insufficient documentation

## 2023-02-20 LAB — CBC
HCT: 39.6 % (ref 36.0–46.0)
Hemoglobin: 13.6 g/dL (ref 12.0–15.0)
MCH: 32.4 pg (ref 26.0–34.0)
MCHC: 34.3 g/dL (ref 30.0–36.0)
MCV: 94.3 fL (ref 80.0–100.0)
Platelets: 303 10*3/uL (ref 150–400)
RBC: 4.2 MIL/uL (ref 3.87–5.11)
RDW: 12.1 % (ref 11.5–15.5)
WBC: 8.6 10*3/uL (ref 4.0–10.5)
nRBC: 0 % (ref 0.0–0.2)

## 2023-02-20 LAB — TROPONIN I (HIGH SENSITIVITY)
Troponin I (High Sensitivity): 2 ng/L (ref <18)
Troponin I (High Sensitivity): 4 ng/L (ref <18)

## 2023-02-20 LAB — BASIC METABOLIC PANEL
Anion gap: 10 (ref 5–15)
BUN: 7 mg/dL (ref 6–20)
CO2: 23 mmol/L (ref 22–32)
Calcium: 9.3 mg/dL (ref 8.9–10.3)
Chloride: 104 mmol/L (ref 98–111)
Creatinine, Ser: 0.77 mg/dL (ref 0.44–1.00)
GFR, Estimated: >60 mL/min (ref >60)
Glucose, Bld: 92 mg/dL (ref 70–99)
Potassium: 3.9 mmol/L (ref 3.5–5.1)
Sodium: 137 mmol/L (ref 135–145)

## 2023-02-20 LAB — HCG, SERUM, QUALITATIVE: Preg, Serum: NEGATIVE

## 2023-02-20 NOTE — ED Provider Notes (Signed)
 [place] [facility] AT [facility] Provider Note   CSN: [id number] Arrival date & time: 02/20/23  1308     History  Chief Complaint  Patient presents with   Dizziness    Kurt Azimi is a 28 y.o. female.  28 year old female presents acute onset of dizziness while she was cooking today.  Patient states that she drank tequila on empty stomach and was dancing and became dizziness.  States that her chest became tight.  Denies any syncope or near syncope.  Has not had any vomiting or diarrhea.  She has not been dyspneic.  Feels better at this time.  No recent vomiting or diarrhea       Home Medications Prior to Admission medications   Medication Sig Start Date End Date Taking? Authorizing Provider  ARIPiprazole  (ABILIFY ) 5 MG tablet Take 1 tablet (5 mg total) by mouth daily. 02/10/23   Delsie Lynwood Morene Lavone, MD      Allergies    Penicillins    Review of Systems   Review of Systems  All other systems reviewed and are negative.   Physical Exam Updated Vital Signs BP 122/86 (BP Location: Right Arm)   Pulse (!) 102   Temp 100 F (37.8 C) (Oral)   Resp 16   Ht 1.575 m (5' 2)   Wt 63.9 kg   LMP  (LMP Unknown)   SpO2 99%   BMI 25.77 kg/m  Physical Exam Vitals and nursing note reviewed.  Constitutional:      General: She is not in acute distress.    Appearance: Normal appearance. She is well-developed. She is not toxic-appearing.  HENT:     Head: Normocephalic and atraumatic.  Eyes:     General: Lids are normal.     Conjunctiva/sclera: Conjunctivae normal.     Pupils: Pupils are equal, round, and reactive to light.  Neck:     Thyroid : No thyroid  mass.     Trachea: No tracheal deviation.  Cardiovascular:     Rate and Rhythm: Regular rhythm. Tachycardia present.     Heart sounds: Normal heart sounds. No murmur heard.    No gallop.  Pulmonary:     Effort: Pulmonary effort is normal. No respiratory distress.     Breath  sounds: Normal breath sounds. No stridor. No decreased breath sounds, wheezing, rhonchi or rales.  Abdominal:     General: There is no distension.     Palpations: Abdomen is soft.     Tenderness: There is no abdominal tenderness. There is no rebound.  Musculoskeletal:        General: No tenderness. Normal range of motion.     Cervical back: Normal range of motion and neck supple.  Skin:    General: Skin is warm and dry.     Findings: No abrasion or rash.  Neurological:     Mental Status: She is alert and oriented to person, place, and time. Mental status is at baseline.     GCS: GCS eye subscore is 4. GCS verbal subscore is 5. GCS motor subscore is 6.     Cranial Nerves: No cranial nerve deficit.     Sensory: No sensory deficit.     Motor: Motor function is intact.     Coordination: Coordination is intact.     Gait: Gait is intact.  Psychiatric:        Attention and Perception: Attention normal.        Speech: Speech normal.  Behavior: Behavior normal.     ED Results / Procedures / Treatments   Labs (all labs ordered are listed, but only abnormal results are displayed) Labs Reviewed  BASIC METABOLIC PANEL  CBC  HCG, SERUM, QUALITATIVE  TROPONIN I (HIGH SENSITIVITY)  TROPONIN I (HIGH SENSITIVITY)    EKG EKG Interpretation Date/Time:  Sunday February 20 2023 16:54:59 EST Ventricular Rate:  99 PR Interval:  147 QRS Duration:  80 QT Interval:  344 QTC Calculation: 442 R Axis:   79  Text Interpretation: Sinus tachycardia Ventricular premature complex ST elevation, consider inferior injury Confirmed by Dasie Faden ([id number]) on 02/20/2023 4:57:11 PM  Radiology DG Chest 2 View Result Date: 02/20/2023 CLINICAL DATA:  Chest pain and dizziness. EXAM: CHEST - 2 VIEW COMPARISON:  None Available. FINDINGS: The heart size and mediastinal contours are within normal limits. Both lungs are clear. The visualized skeletal structures are unremarkable. IMPRESSION: No active  cardiopulmonary disease. Electronically Signed   By: Vanetta Chou M.D.   On: 02/20/2023 14:12    Procedures Procedures    Medications Ordered in ED Medications - No data to display  ED Course/ Medical Decision Making/ A&P                                 Medical Decision Making Amount and/or Complexity of Data Reviewed Labs: ordered. Radiology: ordered.   Patient's EKG from interpretation shows sinus tach.  This is likely from the patient's alcohol use.  Her neurological exam is stable this time.  Low suspicion for stroke.  Chest x-ray shows no acute findings.  Low suspicion for PE as well as ACS.  Patient be discharged home        Final Clinical Impression(s) / ED Diagnoses Final diagnoses:  None    Rx / DC Orders ED Discharge Orders     None         Dasie Faden, MD 02/20/23 1659

## 2023-02-20 NOTE — Discharge Instructions (Signed)
 Drink plenty of liquids when you get home.  Follow-up with your doctor as needed

## 2023-02-20 NOTE — ED Triage Notes (Signed)
 PT here via [facility] from home.  Called 911 for acute onset dizziness while eating.  Also c/o chest pain after eating takeout.  Tangential thought.  Hx of schizophrenia.  Unsure about medications.

## 2023-02-20 NOTE — ED Provider Triage Note (Signed)
 Emergency Medicine Provider Triage Evaluation Note  Demeisha Geraghty , a 28 y.o. female  was evaluated in triage.  Pt complains of chest pain and dizziness.  History of schizophrenia  Review of Systems  Positive: Chest pain times multiple weeks, dizziness this morning, worse with standing Negative: Fever, chills, headache, nausea, vomiting, shortness of breath, abdominal pain  Physical Exam  BP 122/86 (BP Location: Right Arm)   Pulse (!) 102   Temp 100 F (37.8 C) (Oral)   Resp 16   SpO2 99%  Gen:   Awake, no distress   Resp:  Normal effort  MSK:   Moves extremities without difficulty  Other:    Medical Decision Making  Medically screening exam initiated at 1:39 PM.  Appropriate orders placed.  Zitlaly Malson Neubert was informed that the remainder of the evaluation will be completed by another provider, this initial triage assessment does not replace that evaluation, and the importance of remaining in the ED until their evaluation is complete.  Labs and imaging ordered   Francis Ileana SAILOR, PA-C 02/20/23 1340
